# Patient Record
Sex: Male | Born: 1955
Health system: Southern US, Community
[De-identification: ages and names within clinical notes are randomized; demographics above are authoritative.]

## PROBLEM LIST (undated history)

## (undated) DIAGNOSIS — M1990 Unspecified osteoarthritis, unspecified site: Secondary | ICD-10-CM

## (undated) DIAGNOSIS — I2510 Atherosclerotic heart disease of native coronary artery without angina pectoris: Secondary | ICD-10-CM

## (undated) DIAGNOSIS — E785 Hyperlipidemia, unspecified: Secondary | ICD-10-CM

## (undated) DIAGNOSIS — G4730 Sleep apnea, unspecified: Secondary | ICD-10-CM

## (undated) DIAGNOSIS — E119 Type 2 diabetes mellitus without complications: Secondary | ICD-10-CM

## (undated) DIAGNOSIS — I10 Essential (primary) hypertension: Secondary | ICD-10-CM

## (undated) HISTORY — PX: SHOULDER ARTHROSCOPY: SHX128

## (undated) HISTORY — PX: BICEPS TENDON REPAIR: SHX566

## (undated) HISTORY — DX: Sleep apnea, unspecified: G47.30

## (undated) HISTORY — DX: Essential (primary) hypertension: I10

## (undated) HISTORY — PX: KNEE ARTHROSCOPY: SUR90

## (undated) HISTORY — PX: OTHER SURGICAL HISTORY: SHX169

## (undated) HISTORY — PX: TONSILLECTOMY: SUR1361

## (undated) HISTORY — PX: CARDIAC CATHETERIZATION: SHX172

## (undated) HISTORY — DX: Hyperlipidemia, unspecified: E78.5

## (undated) HISTORY — DX: Type 2 diabetes mellitus without complications: E11.9

## (undated) HISTORY — DX: Atherosclerotic heart disease of native coronary artery without angina pectoris: I25.10

---

## 1999-06-21 ENCOUNTER — Inpatient Hospital Stay (HOSPITAL_COMMUNITY): Admission: RE | Admit: 1999-06-21 | Discharge: 1999-06-22 | Payer: Self-pay | Admitting: Otolaryngology

## 2000-12-18 ENCOUNTER — Ambulatory Visit (HOSPITAL_COMMUNITY): Admission: RE | Admit: 2000-12-18 | Discharge: 2000-12-18 | Payer: Self-pay | Admitting: Orthopedic Surgery

## 2000-12-18 ENCOUNTER — Encounter: Payer: Self-pay | Admitting: Orthopedic Surgery

## 2000-12-20 ENCOUNTER — Ambulatory Visit (HOSPITAL_COMMUNITY): Admission: RE | Admit: 2000-12-20 | Discharge: 2000-12-20 | Payer: Self-pay | Admitting: Orthopedic Surgery

## 2001-05-21 ENCOUNTER — Inpatient Hospital Stay (HOSPITAL_COMMUNITY): Admission: AD | Admit: 2001-05-21 | Discharge: 2001-05-23 | Payer: Self-pay | Admitting: *Deleted

## 2001-05-21 ENCOUNTER — Encounter: Payer: Self-pay | Admitting: *Deleted

## 2003-01-12 ENCOUNTER — Ambulatory Visit (HOSPITAL_COMMUNITY): Admission: RE | Admit: 2003-01-12 | Discharge: 2003-01-12 | Payer: Self-pay | Admitting: Gastroenterology

## 2003-01-12 ENCOUNTER — Encounter (INDEPENDENT_AMBULATORY_CARE_PROVIDER_SITE_OTHER): Payer: Self-pay | Admitting: Specialist

## 2003-05-13 ENCOUNTER — Inpatient Hospital Stay (HOSPITAL_COMMUNITY): Admission: EM | Admit: 2003-05-13 | Discharge: 2003-05-18 | Payer: Self-pay | Admitting: Cardiology

## 2004-10-27 ENCOUNTER — Ambulatory Visit: Payer: Self-pay | Admitting: Internal Medicine

## 2004-11-03 ENCOUNTER — Ambulatory Visit: Payer: Self-pay | Admitting: Cardiology

## 2004-11-03 ENCOUNTER — Inpatient Hospital Stay (HOSPITAL_BASED_OUTPATIENT_CLINIC_OR_DEPARTMENT_OTHER): Admission: RE | Admit: 2004-11-03 | Discharge: 2004-11-03 | Payer: Self-pay | Admitting: Cardiology

## 2004-11-15 ENCOUNTER — Ambulatory Visit: Payer: Self-pay | Admitting: Cardiology

## 2005-05-31 ENCOUNTER — Ambulatory Visit (HOSPITAL_COMMUNITY): Admission: RE | Admit: 2005-05-31 | Discharge: 2005-05-31 | Payer: Self-pay | Admitting: Specialist

## 2006-01-30 ENCOUNTER — Encounter: Admission: RE | Admit: 2006-01-30 | Discharge: 2006-01-30 | Payer: Self-pay | Admitting: Specialist

## 2006-04-19 ENCOUNTER — Ambulatory Visit (HOSPITAL_COMMUNITY): Admission: RE | Admit: 2006-04-19 | Discharge: 2006-04-19 | Payer: Self-pay | Admitting: Specialist

## 2007-02-19 ENCOUNTER — Ambulatory Visit: Payer: Self-pay | Admitting: Cardiology

## 2007-02-19 ENCOUNTER — Observation Stay (HOSPITAL_COMMUNITY): Admission: AD | Admit: 2007-02-19 | Discharge: 2007-02-20 | Payer: Self-pay | Admitting: Internal Medicine

## 2008-10-18 ENCOUNTER — Ambulatory Visit (HOSPITAL_COMMUNITY): Admission: RE | Admit: 2008-10-18 | Discharge: 2008-10-18 | Payer: Self-pay | Admitting: Specialist

## 2009-02-14 ENCOUNTER — Encounter: Admission: RE | Admit: 2009-02-14 | Discharge: 2009-02-14 | Payer: Self-pay | Admitting: Specialist

## 2009-02-25 ENCOUNTER — Ambulatory Visit (HOSPITAL_COMMUNITY): Admission: RE | Admit: 2009-02-25 | Discharge: 2009-02-25 | Payer: Self-pay | Admitting: Specialist

## 2010-06-12 LAB — CBC
MCHC: 34.2 g/dL (ref 30.0–36.0)
Platelets: 227 10*3/uL (ref 150–400)
RDW: 13.2 % (ref 11.5–15.5)
WBC: 5.8 10*3/uL (ref 4.0–10.5)

## 2010-06-12 LAB — BASIC METABOLIC PANEL
BUN: 11 mg/dL (ref 6–23)
CO2: 27 mEq/L (ref 19–32)
Chloride: 99 mEq/L (ref 96–112)
GFR calc Af Amer: >60 mL/min (ref >60)
GFR calc non Af Amer: >60 mL/min (ref >60)
Potassium: 4.5 mEq/L (ref 3.5–5.1)

## 2010-06-12 LAB — GLUCOSE, CAPILLARY
Glucose-Capillary: 157 mg/dL — ABNORMAL HIGH (ref 70–99)
Glucose-Capillary: 158 mg/dL — ABNORMAL HIGH (ref 70–99)
Glucose-Capillary: 175 mg/dL — ABNORMAL HIGH (ref 70–99)

## 2010-06-17 LAB — GLUCOSE, CAPILLARY
Glucose-Capillary: 115 mg/dL — ABNORMAL HIGH (ref 70–99)
Glucose-Capillary: 120 mg/dL — ABNORMAL HIGH (ref 70–99)
Glucose-Capillary: 141 mg/dL — ABNORMAL HIGH (ref 70–99)

## 2010-06-17 LAB — COMPREHENSIVE METABOLIC PANEL
Alkaline Phosphatase: 62 U/L (ref 39–117)
Calcium: 9.2 mg/dL (ref 8.4–10.5)
Creatinine, Ser: 0.88 mg/dL (ref 0.4–1.5)

## 2010-06-17 LAB — HEMOGLOBIN AND HEMATOCRIT, BLOOD: HCT: 45.7 % (ref 39.0–52.0)

## 2010-07-25 NOTE — Op Note (Signed)
[name], [name]                 ACCOUNT NO.:  [id number]   MEDICAL RECORD NO.:  [id number]          PATIENT TYPE:  AMB   LOCATION:  DAY                          FACILITY:  [facility]   PHYSICIAN:  Jene Every, M.D.    DATE OF BIRTH:  [birth year]   DATE OF PROCEDURE:  10/18/2008  DATE OF DISCHARGE:                               OPERATIVE REPORT   PREOPERATIVE DIAGNOSIS:  Medial meniscal tear, left knee.   POSTOPERATIVE DIAGNOSES:  Medial meniscal tear, left knee, grade 3  chondromalacia patella and medial femoral condyle, lateral tibial  plateau.   PROCEDURE PERFORMED:  Left knee arthroscopy, partial medial  meniscectomy, chondroplasty patella, medial femoral condyle, lateral  tibial plateau.   ANESTHESIA:  General   ASSISTANT:  None.   BRIEF HISTORY AND INDICATIONS:  This is a 55 year old with refractory  knee pain, swelling and giving way.  This has been refractory to  conservative treatment, was indicated for arthroscopic debridement and  partial medial meniscectomy for suspected meniscus tear.  Risks and  benefits discussed including, bleeding, infection, no change in  symptoms, worsening symptoms, DVT, PE anesthetic complications, etc.   TECHNIQUE:  The patient in supine position, after adequate level of  anesthesia and 2 grams Kefzol, left lower extremity was prepped and  draped in the usual sterile fashion.  A lateral parapatellar portal and  superomedial parapatellar portal was fashioned with a #11 blade.  Ingress cannula atraumatically placed.  Irrigant was utilized to  insufflate the joint.  Under direct visualization, medial parapatellar  portal was fashioned with a #11 blade after localization with 18 gauge  needle sparing the medial meniscus.  Noted was a posterior third, inner  third medial meniscus tear, radial tears, multiple.  Straight basket  rongeur was introduced and utilized to perform a partial medial  meniscectomy to a stable base, further contoured with  35 Cuda shaver.  There were chondral flap tears off the femoral condyle and a light  shaving chondroplasty was performed.  Mild degenerative changes of the  ACL.  This was debrided.  The ACL was not visualized.  He had no  instability on exam.   Lateral compartment reveals a small grade 3 lesion of the anterior  portion of lateral tibial plateau.  This was lightly shaved to a stable  base with a 35 Cuda shaver.   Next in the patellofemoral joint there was normal patellofemoral  tracking.  There was extensive grade 3 changes of patella, chondroplasty  was lightly performed here.  A partial synovectomy performed as well.  The knee was copiously lavaged.  Reexamined medial compartment, both  menisci stable to probe palpation.  No further pathology amenable to  arthroscopic intervention.  Knee was copiously lavaged, all  instrumentation was removed.  Portals  closed with 4-0 nylon simple sutures.  0.25% Marcaine with epinephrine  was infiltrated in the joint.  The wound was dressed sterilely, woken  without difficulty, transported to the recovery room in satisfactory  condition.   The patient tolerated the procedure well.  No complications.      Jene Every, M.D.  Electronically Signed     JB/MEDQ  D:  10/18/2008  T:  10/18/2008  Job:  [id number]

## 2010-07-25 NOTE — H&P (Signed)
[name], [name]                 ACCOUNT NO.:  [id number]   MEDICAL RECORD NO.:  [id number]          PATIENT TYPE:  INP   LOCATION:  3729                         FACILITY:  [facility]   PHYSICIAN:  Gerrit Friends. Dietrich Pates, MD, FACCDATE OF BIRTH:  [birth year]   DATE OF ADMISSION:  02/19/2007  DATE OF DISCHARGE:                              HISTORY & PHYSICAL   SUMMARY OF HISTORY:  [name] is a 55 year old white male who was  transferred from [facility] emergency room via [facility] to [facility] for further evaluation of chest discomfort.  Mr. Mcphearson  describes a 2-day history of sharp left anterior chest pain.  He  describes it as a quick sensation lasting 3-4 seconds, giving it a 5-6  on a scale of 0/10.  He notices a flushed feeling when he experiences  the discomfort, but he specifically denies any shortness of breath,  nausea, vomiting, diaphoresis.  He has also noted some consistent left  arm and hand numbness in the past couple days but not specifically  associated with a sharp discomfort.  He initially attributed this to his  left biceps surgery that he had many years ago, but this is a new  sensation for him.  He has had four episodes of this discomfort, three  last night while working.  After the third episode last night, he  presented to the emergency room for evaluation.  He denies prior  occurrences and states that this discomfort is different from his prior  intervention which he describes as anterior chest pressure sensation.  He goes on to say that he has had several interventions, and prior to  each intervention, he has had a negative stress test despite  catheterization showing a significant blockage.   PAST MEDICAL HISTORY:  NO KNOWN DRUG ALLERGIES.   MEDICATIONS:  Prior to admission, the patient is unclear prescriptions  and doses.  He thinks he is on generic Altace, Lipitor, Lantus,  Glucophage and folic acid.  He is not clear about the rest.  He does  take  aspirin 325 every day, and he does not know where his nitroglycerin  is.   He has a history of obstructive sleep apnea.  He does use CPAP.  He has  had uvula and tonsil and adenoid surgery in April 2001.  He has a  history of hypertension for the preceding 5 years, although he does not  check his blood pressures.  He also has type 2 diabetes for the  preceding 6 years, although he does not check his sugars.  History of  hyperlipidemia, unknown last check.  He does have a history of coronary  artery disease with an Express stent to the LAD in March 2003.  He had a  cutting balloon angioplasty of the LAD at the descending bifurcation by  Dr. Chales Abrahams in March 2005.  Each test was preceded by a stress test that  according to the patient was negative.  Last catheterization was on  November 03, 2004 that showed EF of 55%, anterior hypokinesis,  nonobstructive coronary disease, continue medical treatment.  Surgical history in addition to the above is also notable for left  rotator cuff repair in 2008, right knee surgery March 2007, left biceps  surgery and LASIK surgery.   SOCIAL HISTORY:  He lives in [place] with his wife.  He is employed with  [employer].  He has never smoked.  He may drink 1 time every  2-3 weeks, including beer.  Last intake was this past Sunday at the  [employer] game in which he had five 16-ounce beers.  He denies any drugs  or herbal medications.  Does not follow a diet and does not exercise.   FAMILY HISTORY:  His mother died in her 13s with sepsis.  Father at the  age of 68 with myocardial infarction.  He has one sister who is alive  and well and who is in remission from some type of cancer.   REVIEW OF SYSTEMS:  In addition to the above is notable for reading  glasses.  He denies any arthralgias, given his history of arthritis.  All other systems unremarkable.   PHYSICAL EXAMINATION:  GENERAL:  Well-nourished, well-developed,  pleasant, obese white male in  no apparent distress.  Blood pressure is  115/76, pulse 88 and regular, respirations 18, glucose 102, 98%  saturation on room air.  HEENT:  Is unremarkable.  NECK:  Supple without thyromegaly, adenopathy, JVD or carotid bruits.  CHEST:  Symmetrical excursion.  Clear to auscultation without rales,  rhonchi or wheezing.  HEART:  PMI is not displaced.  Regular rate and rhythm.  Do not  appreciate any murmurs, rubs, clicks or gallops.  All pulses are  symmetrical and intact.  I did not appreciate any abdominal or femoral  bruits.  SKIN:  Integument is intact without rashes or lesions.  ABDOMEN:  Obese.  Bowel sounds present without organomegaly, masses or  tenderness.  EXTREMITIES:  Negative cyanosis, clubbing or edema.  MUSCULOSKELETAL/NEUROLOGICAL:  Unremarkable.   Chest x-ray done at [facility]; results are pending.  EKG shows normal  sinus rhythm, left axis deviation, early R-wave, nonspecific ST-T wave  changes with a ventricular rate of 85.  H&H is 14.4 and 40.8, normal  indices, platelets 212, WBCs 6.4.  Sodium 136, potassium 3.4, BUN 13,  creatinine 0.8, glucose 167.  PTT 27.3, PT 14.1 with INR 1.1.  CK total  was 177 and MB 6.6 and troponin 0.02.  At [facility], he received  IV heparin 5000 unit bolus as well as a drip, nitroglycerin paste 1-  inch, IV Lopressor 5 mg x2.   IMPRESSION:  1. Atypical chest discomfort with normal EKG and troponin but slightly      abnormal CK-MB.  2. Hypokalemia.  3. Hyperglycemia.  4. History as noted per past medical history.   DISPOSITION:  Dr. Dietrich Pates reviewed the patient's history, spoke with  and examined the patient, and agrees with the above.  Given his negative  stress test prior to positive catheterizations and interventions, the  patient is reluctant to proceed with stress test to evaluate his chest  discomfort.  Under these circumstances, cardiac catheterization is  appropriate.  I have asked his wife to bring in his home  medications,  but until then, we will continue IV heparin, aspirin, Lipitor, Protonix  and potassium supplementation as well as a beta blocker.  We will hold  Glucophage.  I have also ordered diabetes and dietary teaching.      Joellyn Rued, PA-C      Gerrit Friends. Dietrich Pates, MD,  [facility]  Electronically Signed    EW/MEDQ  D:  02/19/2007  T:  02/19/2007  Job:  [id number]   cc:   Learta Codding, MD,FACC  Wyvonnia Lora

## 2010-07-25 NOTE — Cardiovascular Report (Signed)
[name], GARCIAGARCIA                 ACCOUNT NO.:  [id number]   MEDICAL RECORD NO.:  [id number]          PATIENT TYPE:  INP   LOCATION:  3729                         FACILITY:  [facility]   PHYSICIAN:  Arturo Morton. Riley Kill, MD, FACCDATE OF BIRTH:  11-Mar-1956   DATE OF PROCEDURE:  DATE OF DISCHARGE:                            CARDIAC CATHETERIZATION   INDICATIONS:  Mr. Javid is a 55 year old diabetic who has previously  undergone stenting of the left anterior descending artery.  In 2005, I  did a repeat intervention involving cutting balloon angioplasty of the  LAD stent.  He now presents with some recurrent chest pain, although he  says that this is slightly different than what he has had in the past.  Importantly, in the past it was somewhat atypical, but with inflation of  the balloon, it exactly reproduced the symptoms.  The patient remembers  this as to I.   He was brought to the lab for further evaluation.   PROCEDURE:  1. Left heart catheterization.  2. Selective coronary arteriography.  3. Selective left ventriculography.   DESCRIPTION OF PROCEDURE:  The patient was brought to the cath lab and  prepped and draped in the usual fashion.  Through an anterior puncture,  the right femoral artery was easily entered.  A 5-French sheath was  placed.  Views of the left and right coronary arteries were obtained in  multiple angiographic projections.  Central aortic and left ventricular  pressures were measured with pigtail.  Ventriculography was then  performed in the RAO projection.  I then carefully studied the films and  went back and took additional views of the LAD to make sure that the LAD  stent diagonal area as well laid out.  Following this, all catheters  were subsequently removed.  He is taken to the holding area for sheath  removal.  There were no complications in the laboratory.   HEMODYNAMIC DATA:  1. Central aortic pressure 102/75, mean 88.  2. Left ventricular pressure  107/13.  3. No gradient or pullback across the aortic valve.   ANGIOGRAPHIC DATA:  1. The left main coronary artery short and free of critical disease.  2. The LAD has minimal luminal irregularity proximally.  Following      this, there is probably about 20% narrowing in the midportion of      the proximal LAD.  Just distal to this, there is a trifurcation of      the septal perforator, enlarged diagonal, and an LAD.  Just prior      to that is the previous site of stenting.  The stent site appears      to be really quite widely patent.  The LAD just beyond this large      diagonal septal takeoff has about 40-50% narrowing, but appears to      be a relatively stable lumen.  The distal LAD then bifurcates      distally.  The large diagonal branch has some luminal irregularity      in its midportion but does not appear to be critically narrow.  3. The circumflex has a small first marginal and a large second      marginal, both of which appear to be free of critical disease  4. The right coronary artery is a large-caliber tortuous vessel.  The      right coronary artery has about 30% mid-narrowing.  There is about      40% posterolateral stenosis and a 20% mid PDA stenosis.  There does      not appear to be high-grade narrowing in this vessel.   CONCLUSION:  1. Preserved left ventricular function with ejection fraction in      excess of 60%.  2. Continued patency of the stent site at the site of subsequent      cutting balloon angioplasty.  3. Scattered coronary irregularities as noted above.  4. Probable poor control of diabetes mellitus.   RECOMMENDATIONS:  We will likely recommend medical therapy.  We will  keep him overnight and recheck his enzymes.      Arturo Morton. Riley Kill, MD, [facility]  Electronically Signed     TDS/MEDQ  D:  02/19/2007  T:  02/20/2007  Job:  [id number]   cc:   Ellyn Hack, MD,FACC  [facility]

## 2010-07-25 NOTE — Discharge Summary (Signed)
[name], [name]                 ACCOUNT NO.:  [id number]   MEDICAL RECORD NO.:  [id number]          PATIENT TYPE:  INP   LOCATION:  3729                         FACILITY:  [facility]   PHYSICIAN:  Gerrit Friends. Dietrich Pates, MD, FACCDATE OF BIRTH:  04-Nov-1955   DATE OF ADMISSION:  02/19/2007  DATE OF DISCHARGE:                               DISCHARGE SUMMARY   PROCEDURES:  1. Cardiac catheterization.  2. Coronary arteriogram.  3. Left ventriculogram.   PRIMARY FINAL DISCHARGE DIAGNOSIS:  Chest pain, medical therapy for  coronary artery disease.   SECONDARY DIAGNOSES:  1. Obstructive sleep apnea, on continuous positive airway pressure.  2. Hypertension.  3. Diabetes.  4. Hyperlipidemia.  5. Status post Express stent to the left anterior descending artery in      March 2003.  6. Cutting balloon angioplasty to the left anterior descending artery      at the descending bifurcation in March 2005.  7. Status post tonsillectomy, adenoidectomy and uvulectomy August      2001.  8. Status post left rotator cuff repair, right knee surgery, left      biceps surgery and LASIK surgery.  9. Family history of coronary artery disease in his father.  10.Hypokalemia.   TIME AT DISCHARGE:  33 minutes.   HOSPITAL COURSE:  [name] is a 55 year old male with known coronary  artery disease.  He had sharp anterior chest pain that was associated  with some left arm and hand numbness.  He came to [facility],  where he was evaluated and transferred to [facility].   His cardiac enzymes were negative for MI.  To further assess him, a  cardiac catheterization was performed on February 19, 2007.  The cardiac  catheterization showed multivessel disease with lesions between 20 and  50% in the LAD, RCA, PDA, PLA, and in a diagonal branch.  There was 40%  in-stent restenosis.  His EF was normal.  Dr. Riley Kill evaluated the  films and recommended medical therapy.   [name] was held overnight and his labs  were reviewed.  His cardiac  enzymes remained negative and his blood pressure was stable.  His blood  sugars ranged between 109 and 176.  He is encouraged to follow heart-  healthy diet and continue his Lantus.  His hemoglobin A1c was 8.5.  Total cholesterol was 178 with triglycerides 269, HDL 30, LDL 94.  Of  note, his initial CK-MB was slightly elevated at 18/4.5 with an index of  3.3.  However, other CK-MBs and all troponins were negative.   On February 20, 2007, his vital signs were stable and his cath site was  benign.  He was evaluated by Dr. Dietrich Pates, who considered him stable for  discharge with outpatient follow-up arranged.   DISCHARGE INSTRUCTIONS:  1. His activity level is to be increased gradually with no lifting for      a week and no driving for 2 days.  2. He is to call our office for any problems with the catheterization      site.  3. He is to stick to  a diabetic diet.  4. He is to follow up with Dr. Andee Lineman on January 2 at 3 p.m. and with      Dr. Margo Common as needed.   DISCHARGE MEDICATIONS:  1. Metformin 850 mg b.i.d., restart February 22, 2007.  2. Ramipril 5 mg daily.  3. Lipitor 40 mg a day.  4. Aspirin 325 mg daily.  5. Fish oil 2400 mg daily.  6. Lantus 72 units daily.  7. Folate Plus daily.  8. Metoprolol 25 mg b.i.d.  9. Nitroglycerin sublingual p.r.n.  10.Tramadol 50 mg and diclofenac 75 mg as prior to admission.      Theodore Demark, PA-C      Gerrit Friends. Dietrich Pates, MD, [facility]  Electronically Signed    RB/MEDQ  D:  02/20/2007  T:  02/20/2007  Job:  [id number]   cc:   [facility]  Wyvonnia Lora

## 2010-07-28 NOTE — Op Note (Signed)
[name]  Patient:    [name], [name]                        MRN: [id number] Proc. Date: 06/21/99 Adm. Date:  [id number] Attending:  Barbee Cough                           Operative Report  PREOPERATIVE DIAGNOSES: 1. Severe obstructive sleep apnea. 2. Hypertrophy of the tonsils, palate and tongue base. 3. Non-insulin-dependent diabetes.  POSTOPERATIVE DIAGNOSES: 1. Severe obstructive sleep apnea. 2. Hypertrophy of the tonsils, palate and tongue base. 3. Non-insulin-dependent diabetes.  INDICATIONS: 1. Severe obstructive sleep apnea. 2. Hypertrophy of the tonsils, palate and tongue base. 3. Non-insulin-dependent diabetes.  SURGICAL PROCEDURE: 1. Uvulopalatopharyngoplasty. 2. Somnoplasty (volumetric reduction) of the tongue base. 3. Tonsillectomy.  SURGEON:  Kinnie Scales. Annalee Genta, M.D.  ANESTHESIA:  General endotracheal.  ESTIMATED BLOOD LOSS:  approximately 50 cc.  COMPLICATIONS:   None.  Patient was transferred from the operating room to the recovery room in stable condition.  BRIEF HISTORY:  [name] is a 55 year old white male, who presented for evaluation and surgical management of obstructive sleep apnea.  The patient had undergone multiple previous treatments and evaluations and had been wearing home CPAP for  approximately 10 years with relatively good results.  The patient was frustrated with his chronic CPAP usage and desired surgical intervention in order to improve his airway.  A preoperative sleep study was obtained in February 2001 and this showed an RDI of 72.3 events per hour.  Patient responded well to CPAP with complete resolution of his ongoing apnea.  Given his history and examination, including diagnostic nasal laryngoscopy, the patient was found to have obstruction related to the palate, tonsils and tongue base.  Patient had undergone previous  laser palatoplasty with limited improvement in his day to  day symptoms.  Given is history and examination, he was scheduled for the above surgical procedures. Prior to surgery, informed consent was obtained from his patient and his wife.  The risks, benefits, and possible complications of each of these procedures were discussed in detail and they understood and concurred with our plan for surgery. SURGICAL PROCEDURE:  Patient was brought to the operating room on June 21, 1999 at [facility] main OR.  General endotracheal anesthesia was established without  difficulty.  When the patient had been adequately anesthetized, the oral cavity and oropharynx were examined.  He was found to have hypertrophy of the palate, tonsils and tongue base.  The surgical procedure was begun with a uvulopalatopharyngoplasty and tonsillectomy.  A Crowe-Davis mouthgag was inserted without difficulty. Patients oral cavity and oropharynx were irrigated and suctioned and tonsillectomy was performed using the harmonic scalpel and beginning on the left hand side. Dissecting in a subcapsular fashion, the entire left tonsil was removed. Dissection carried from the superior tonsillar pole to tongue base.  The right tonsil was removed in a similar fashion and tissue was sent to pathology for gross and microscopic evaluation.  The uvulopalatopharyngoplasty was then undertaken.  The palate was gently palpated posteriorly and the patient was found to have palatal hypertrophy, approximately 1-1.5 cm of palatal tissue, including the remnant of the uvula and the free margin of the palate was resected using the Harmonic scalpel.  Dissection was carried from the superior aspect of the tonsillar fossae and the incision was carried from anterior to posterior through the  palatal tissue leaving palatal mucosa posteriorly.  Posterior tonsillar pillars were left intact with removal of the abnormal tissue.  Suspensory sutures were then placed consisting of 3-0 Vicryl suture  placed in a horizontal mattressing fashion. The posterior tonsillar pillars were advanced anteriorly and sutured bilaterally. Interrupted sutures then placed along the palatal mucosal margin and the posterior mucosa was advanced anteriorly.  Multiple sutures were placed and the palatal fossa was closed bilaterally.  Prior to closure, the Crowe-Davis mouthgag was released and reapplied and gentle abrasion with a dry tonsillar sponge was performed. There was no active bleeding along the tonsillar fossa or tongue base.  With the tonsillectomy and uvulopalatopharyngoplasty completed, the tongue base somnoplasty was performed.  The patient was injected with a combination of 0.5% Marcaine with 1:200,000 solution epinephrine, which had been diluted 50/50 in sterile injectable normal saline.  Approximately 4 cc of injection was then placed in the tongue base at each of the somnoplasty sites.  A total of 4 somnoplasty lesions were created in the region of the circumvallate papillae.  Using the somnoplasty device with a tongue base probe set at a 10 watt maximum, the energy was delivered in individual lesions for a total of four lesions at 750 joules per lesion.  There was no bleeding and no significant swelling.  The patients oral cavity and oropharynx were then thoroughly irrigated.  There was no bleeding from the oropharynx and ral gastric tube was passed and the stomach contents were aspirated.  Patient was then awakened from his anesthetic.  He was extubated without difficulty and was transferred from the operating room to the recovery room in stable condition. DD:  06/21/99 TD:  06/21/99 Job: [id number]

## 2010-07-28 NOTE — Op Note (Signed)
NAMEGAVIN, [name]                 ACCOUNT NO.:  [id number]   MEDICAL RECORD NO.:  [id number]          PATIENT TYPE:  AMB   LOCATION:  DAY                          FACILITY:  [facility]   PHYSICIAN:  Jene Every, M.D.    DATE OF BIRTH:  [birth year]   DATE OF PROCEDURE:  04/19/2006  DATE OF DISCHARGE:                               OPERATIVE REPORT   PREOPERATIVE DIAGNOSIS:  Rotator cuff arthropathy, acromioclavicular  arthrosis.   POSTOPERATIVE DIAGNOSIS:  Rotator cuff arthropathy, acromioclavicular  arthrosis.   PROCEDURE PERFORMED:  Left shoulder arthroscopy, subacromial  decompression, bursectomy, followed by open distal clavicle resection.   ANESTHESIA:  General.   ASSISTANT:  Roma Schanz, P.A.   BRIEF HISTORY/INDICATIONS:  A 55 year old with shoulder pain, positive  impingement sign.  Positive secondary impingement sign.  Tender over the  [facility].  Positive crossover maneuver.  [facility] demonstrated significant AC  arthrosis, rotator cuff arthropathy.  Operative intervention was  indicated for arthroscopic debridement and subacromial decompression,  possible open distal clavicle resection.  Risks and benefits discussed,  including bleeding, infection, damage to vascular structures, no changes  in symptoms, worsening symptoms, need for repeat debridement procedure  in the future.   TECHNIQUE:  Patient in the supine beach chair position after the  induction of adequate general anesthesia and 1 gm of Kefzol, the left  shoulder and upper extremity was prepped and draped in the usual sterile  fashion.  A surgical marker was utilized to delineate the acromion and  the [facility] joint.  A standard posterolateral incision was made through the  skin only and the anterolateral incision as well.  I introduced the  arthroscopic cannula into the glenohumeral space with the arm in the  70/30 position towards the coracoid, penetrating it atraumatically.  Irrigant was utilized to insufflate the  joint.  Inspection revealed a  normal glenohumeral joint.  There was normal biceps tendon and mild  degenerative fraying of the labrum.  Mild degenerative fraying of the  inferior aspect of the rotator cuff with internal and external rotation.  No full-thickness tear was appreciated.  After copious lavage, we  redirected the camera into the subacromial space.  There was  hypertrophic synovitis noted.  Introduced the camera laterally.  The  shaver was introduced to utilize and perform a bursectomy.  Full  inspection of the rotator cuff revealed no evidence of a rotator cuff  tear.  I used the ArthroWand to release  the CA ligament and shave the  under-surface of the acromion.  I used the ArthroWand to remove the  under-surface of the distal clavicle, the capsule.  Could not deliver it  into the subacromial space.  This was felt to be fairly hypertrophic  with inferior projecting osteophytic spurs.  Next, I tried to convert to  an open distal clavicle resection and removed the arthroscopic camera  and closed the portals with 4-0 nylon simple sutures.  I made an  incision over the [facility] joint, and subcutaneous tissue was dissected with  electrocautery utilized to achieve hemostasis.  The deltotrapezia fascia  was divided, as was  the capsule.  We skeletonized the distal clavicle  with an AL elevator to protect it anteriorly and posteriorly with a  Rogelia Rohrer and a Ship broker inferiorly.  I used an oscillating saw to  remove the distal centimeter of the clavicle.  This was hypertrophic.  There was synovitis noted.  I then undercut the remaining portion of the  distal clavicle with a 3 mm Kerrison, removing all inferiorly projecting  spurs.  This was then felt to be satisfactory.  It was still stable  after the decompression.  We copiously irrigated it and placed bone wax  on the distal clavicle.  I then repaired the capsule with #1 Vicryl  interrupted figure-of-eight sutures.  The subcutaneous  tissue was  reapproximated with 2-0 Vicryl simple sutures.  The skin was  reapproximated with 4-0 subcuticular Prolene.  The wound was reinforced  with Steri-Strips.  Sterile dressing applied.  He was placed supine on  the hospital bed, extubated without difficulty.  Placed in a sling and  transported to the recovery room in satisfactory condition.  We closed  the arthroscopic portals as well with 4-0 nylon.  Assistant was  [employer].      Jene Every, M.D.  Electronically Signed     JB/MEDQ  D:  04/19/2006  T:  04/19/2006  Job:  [id number]

## 2010-07-28 NOTE — Cardiovascular Report (Signed)
NAMEOATHER, MUILENBURG                 ACCOUNT NO.:  [id number]   MEDICAL RECORD NO.:  [id number]          PATIENT TYPE:  OIB   LOCATION:  6501                         FACILITY:  [facility]   PHYSICIAN:  Rollene Rotunda, M.D.   DATE OF BIRTH:  [birth year]   DATE OF PROCEDURE:  11/03/2004  DATE OF DISCHARGE:                              CARDIAC CATHETERIZATION   PRIMARY CARE PHYSICIAN:  Dr. Wyvonnia Lora   PROCEDURE:  Left heart catheterization/coronary arteriography.   INDICATION:  To evaluate the patient with chest pain and previous stenting  to his LAD and also cutting balloon of restenosis.   PROCEDURE NOTE:  Left heart catheterization is performed via the right  femoral artery. The artery was cannulated using the anterior wall puncture.  A #4-French arterial sheath was inserted via the modified Seldinger  technique. A preformed Judkins' and a pigtail catheter were utilized. The  patient tolerated the procedure well and left the laboratory in stable  condition.   RESULTS:   HEMODYNAMICS:  LV 109/13, AO 110/90. Coronaries--the left main was normal.  The LAD had a proximal stent with diffuse in-stent luminal irregularities.  There was a mid 25% stenosis after the stent. The first diagonal was a large  vessel and normal. The circumflex and the AV groove were normal. There was a  large mid obtuse marginal which was normal. The right coronary artery was a  large dominant vessel. There were diffuse luminal irregularities. The PDA  was large and normal.   LEFT VENTRICULOGRAM:  The left ventriculogram was obtained in the RAO  projection. The EF was approximately 55% with mild anterior hypokinesis.   CONCLUSION:  Residual nonobstructive coronary artery disease with a patent  LAD stent. Mild left ventricular dysfunction.   PLAN:  The patient will continue to have aggressive secondary risk  reduction. No further cardiovascular evaluation is warranted at this point.     ______________________________  Rollene Rotunda, M.D.     JH/MEDQ  D:  11/03/2004  T:  11/04/2004  Job:  [id number]   cc:   Wyvonnia Lora  [address]  [place]  [place] [address]  Fax: [phone]   Arvilla Meres, M.D. [facility]  [employer]  [address]  [place]  [place] [address]

## 2010-07-28 NOTE — Discharge Summary (Signed)
[place]. [facility]  Patient:    [name], [name]                        MRN: [id number] Adm. Date:  [id number] Disc. Date: [id number] Attending:  Barbee Cough                           Discharge Summary  ADMISSION DIAGNOSES: 1. Severe obstructive sleep apnea. 2. Tonsil, palatal, and tongue base hypertrophy. 3. Hypertension. 4. Non-insulin-dependent diabetes.  DISCHARGE DIAGNOSES: 1. Severe obstructive sleep apnea. 2. Tonsil, palatal, and tongue base hypertrophy. 3. Hypertension. 4. Non-insulin-dependent diabetes.  DISPOSITION:  The patient is discharged home.  CONDITION ON DISCHARGE:  Stable.  DISCHARGE MEDICATIONS: 1. Altace 5 mg p.o. q.d. 2. Glucophage 850 mg p.o. b.i.d. 3. In addition, he was prescribed Lortab elixir, 3-4 teaspoons every four to    six hours as needed for pain. 4. Augmentin elixir 400 mg/5 cc, 2 teaspoon twice daily.  ACTIVITY:  Limited.  No lifting or straining.  DIET:  Soft and liquids, as tolerated.  SPECIAL INSTRUCTIONS:  Bedside humidification and home CPAP humidification.  FOLLOW-UP:  The patient will follow up in my office on Jul 11, 1999, for further postoperative evaluation and management.  SURGICAL PROCEDURE: 1. Tongue base somnoplasty. 2. Uvulopalatopharyngoplasty. 3. Tonsillectomy.  Performed June 21, 1999, under general anesthesia.  HISTORY OF PRESENT ILLNESS:  [name] is a 55 year old white male with a history of severe obstructive sleep apnea.  He has undergone multiple previous airway procedures for management of sleep apnea, and currently uses CPAP on a nightly basis in order to control his severe obstructive sleep apnea.  On a recent sleep study performed in February 2001, he was found to have an RDI of approximately 75 events per hour, which is well controlled with CPAP.  The patients past medical history also includes hypertension, which is controlled with Altace, and non-insulin-dependent  diabetes.  The patient had undergone previous procedures including sinonasal surgery and palatal laser surgery with limited improvement.  Given the patients history and examination, including flexible diagnostic nasal laryngoscopy, I had recommended additional surgical intervention consisting of tonsillectomy, uvulopalatopharyngoplasty, and tongue base somnoplasty.  The risks, benefits, and possible complications of each of these surgical procedures, and the risk of worsening the patients underlying apnea in a brief postoperative period, were discussed.  [name] and his wife understood and concurred with our plan for surgery, and he was admitted to the hospital for the above procedures.  HOSPITAL COURSE:  The patient was admitted on June 21, 1999, to [facility], and taken to the operating room where he underwent tongue base somnoplasty, uvulopalatopharyngoplasty, and tonsillectomy.  The surgical procedure was uneventful, and the patient was transferred from the operating room to the recovery room in stable condition.  After adequate recovery, he was then transferred to Unit 2100 for postoperative care.  The patient was monitored in the postoperative period with continuous pulse oximetry and cardiac monitoring, observing the patient for worsening obstructive sleep apnea.  The patients blood oxygen level was stable at greater than 93% throughout the entire postoperative period on room air.  He wore his home CPAP while asleep, and again his pulse oximetry, blood pressure, and cardiac rhythm were normal.  The patient complained of moderate sore throat, which was well controlled with his oral narcotic pain medications.  His blood sugars were followed closely, and CBG levels  were below 250 throughout the postoperative period.  The patient was controlled with his oral hypoglycemic agent as well as additional sliding scale insulin.  [name] is discharged to home in stable condition,  tolerating a soft oral diet and liquids, having normal bowel and bladder function, and ambulating without difficulty.  He is discharged with the above discharge instructions, and will follow up in my office on Jul 11, 1999, for further evaluation and management of the above problems. DD: 06/22/99 TD:  06/22/99 Job: [id number]

## 2010-07-28 NOTE — Cardiovascular Report (Signed)
[place]. [facility]  Patient:    [name], [name] Visit Number: [id number] MRN: [id number]          Service Type: MED Location: [id number] Attending Physician:  Glennon Hamilton Dictated by:   Veneda Melter, M.D. Proc. Date: 05/22/01 Admit Date:  05/21/2001 Discharge Date: 05/23/2001   CC:         Jonelle Sidle, M.D. [facility]  Wyvonnia Lora, M.D.   Cardiac Catheterization  PROCEDURES PERFORMED: 1. Left heart catheterization. 2. Left ventriculogram. 3. Selective coronary angiography. 4. Percutaneous transluminal coronary angioplasty and stent placement,    proximal left anterior descending.  DIAGNOSES: 1. Two-vessel coronary artery disease. 2. Normal left ventricular systolic function. 3. Unstable angina.  INDICATIONS: The patient is a 55 year old white male, who presents with several days of substernal chest discomfort. The patient presented to the hospital and ruled out for acute myocardial infarction. Due to his risks factors and the severity of his pain, he presents now for further cardiac assessment.  TECHNIQUE: After informed consent was obtained, the patient was brought to the cardiac catheterization lab where a 6 French sheath was placed in the right femoral artery using the modified Seldinger technique.  A 6 [place] and JR4 catheters then used to engage the left and right coronary arteries and selective angiography performed using manual injections of contrast. A 6 French pigtail catheter was then advanced to the left ventricle and a left ventriculogram performed using power injections of contrast.  FINDINGS: Initial findings are as follows: 1. Left main trunk: The left main trunk is a medium caliber vessel with mild    irregularities. 2. LAD: This is a large caliber vessel that provides a major first diagonal    branch in its proximal segment as well as a septal perforator. The    LAD then extends to the apex.  There is a eccentric high-grade narrowing    of 90% in the proximal LAD and what appears to be an ulcerated lesion.    This extends up to the diagonal branch. There is then moderate disease    encompassing this bifurcation extending into the mid LAD. The distal    LAD has mild irregularities as does the large first diagonal branch. 3. Left circumflex artery: A medium caliber vessel that consists of a large    diagonal branch in the mid section. The left circumflex system has mild    irregularities. 4. Right coronary artery is dominant: This is a large caliber vessel that    provides a posterior descending artery and a posterior ventricular branch    in its terminal segment. The right coronary artery has moderate narrowing    of 50% in the mid section.  LEFT VENTRICULOGRAM: Normal end-systolic and end-diastolic dimensions. Overall left ventricular function is well preserved, ejection fraction of approximately 55%. No mitral regurgitation noted. There is however, hypokinesis of the mid anterolateral wall. Left ventricular pressure is 115/10, aortic is 115/73. LVEDP equals 20.  These findings were reviewed with the patient including treatment options and he elected to proceed with percutaneous intervention. The 6 French sheath was exchanged for a 7 [place] sheath. The patient was given heparin and ReoPro on a weight-adjusted basis to maintain an ACT of greater than 225 seconds.  He was also given 300 mg of Plavix at the end of the case. A 7 French Q 3.5 guide catheter was used to engage the left coronary artery and a 0.014 inch extra-support  wire advanced in the distal LAD. A second extra-support wire was then positioned in the diagonal branch for protection. A 3.0 x 15 mm Maverick balloon was introduced and used to pre-dilate the lesion. Two inflations were performed at 8 atmospheres for 30 seconds. Repeat angiography showed improvement in vessel lumen with significant residual disease. A  3.5 x 15 mm Cutting Balloon was introduced. Two inflations were performed at 6 atmospheres for 30 seconds and a single inflation of 10 atmospheres for 30 seconds in the proximal and mid LAD across the bifurcation. Repeat angiography showed improvement in vessel lumen, particularly immediately after the diagonal branch. There was residual disease of 30-40% prior to the bifurcation as this was the area of severe stenosis and perhaps and ulcerated plaque. It was felt that stent placement would be beneficial. A 4.0 x 12 mm Express II stent was introduced and carefully positioned in the proximal LAD extending up to the bifurcation where the diagonal branch was deployed at 12 atmospheres for 45 seconds. Repeat angiography was performed after the administration of intracoronary nitroglycerin showing excellent result with no residual stenosis in the proximal LAD, only mild residual disease distal to the bifurcation and excellent flow through the LAD and diagonal branch of TIMI grade 3. There was no evidence of compromise of the diagonal branch. Final angiography was performed in various projections confirming these findings. The patient did have chest discomfort with balloon inflations that resolved at the end of the case. He was then transferred to the holding area in stable condition. The sheath can be removed when the ACT returns to normal.  FINAL RESULTS: Successful PTCA and stent placement in the proximal LAD with reduction of 90% ulcerated narrowing to 0% with a 4.0 x 12 mm Express II stent. Dictated by:   Veneda Melter, M.D. Attending Physician:  Glennon Hamilton DD:  05/22/01 TD:  05/24/01 Job: [id number]

## 2010-07-28 NOTE — Op Note (Signed)
[place]. [facility]  Patient:    [name], [name] Visit Number: [id number] MRN: [id number]          Service Type: DSU Location: RCRM 2550 03 Attending Physician:  Cornell Barman Dictated by:   Lenard Galloway Chaney Malling, M.D. Proc. Date: 12/20/00 Admit Date:  12/20/2000 Discharge Date: 12/20/2000                             Operative Report  PREOPERATIVE DIAGNOSIS:  Avulsion biceps proximal radius left palm.  POSTOPERATIVE DIAGNOSIS:  Avulsion biceps proximal radius left palm.  OPERATION:  Reattachment distal biceps tendon to the radial tuberosity proximal end of the left radius using Mitec staple.  ANESTHESIA:  General.  SURGEON:  Rodney A. Chaney Malling, M.D.  PROCEDURE:  The patient was placed on the operating room table in the supine position.  A Pneumatic tourniquet was placed about the left upper arm.  The left arm was prepped with DuraPrep and draped out in the usual manner.  The arm was then wrapped out with an Esmarch.  The tourniquet was elevated. Lazy-S incision made in the antecubital area and dissection was carried down to the biceps fascia.  The biceps fascia was opened.  The biceps could be seen to be retracted.  There was a great deal of blood about the biceps.  The distal end of the biceps tendon which was dissected free was found to be markedly thickened and folded back on itself and scarred down.  The belly of the biceps was dissected free with the finger and some length was regained. Antecubital fossa was very carefully visualized.  The tunnel for the biceps could clearly be seen and it was dissected with the finger.  This was carried down to the proximity of the radius very easily.  The radial tuberosity could be palpated as the arm was supinated.  A two incision technique was selected. An incision was made over the posterior aspect of the elbow.  The anconeus was stripped off subperiosteally down to the interval between the radius  and ulna. With the finger in the depths of the wound in the antecubital area and through the posterior incision, the radial tubercle could be palpated.  The interval between the radius and ulna was then opened with a blunt instrument. Sutures were placed through the stump of the biceps and sutures brought out through the hole on the posterior lateral aspect of the elbow.  Traction was placed on the tendon and the tendon was shortened, markedly thickened and could not be brought between the radius and ulna.  Attempt was made to reattach through the second incision through the radial tubercle but the tendon simply was not long enough.  It was then selected to attach this anteriorly through the antecubital wound.  Army retractors were placed in the wound.  Excellent visualization of the radius could be seen.  A periosteal elevator was used to strip all superficial soft tissue from the radius itself.  A rongeur was used to scrape up the cortex and fish scale the cortex in this area.  A drill hole was placed in the radius and a large Mitec staple was placed through this drill hole.  There was excellent fixation.  Big heavy sutures were brought through the distal end of the biceps tendon.  This was pulled down to its anatomic site.  This was held in position as the knot was slipped down and tightened.  Several sutures were placed through the biceps tendon itself and sutured back down on the radius. Excellent fixation was achieved.  Very carefully that was kept flexed.  The subcutaneous tissues were closed with 2-0 Vicryl.  Skin was closed with stainless steel staples.  Large bulky compressive dressing with elbow flexed to 90 degrees and posterior plastic splint was applied.  Patient was then placed in a sling and returned to the recovery room in excellent condition.  This procedure went extremely well.  DRAINS:  None.  COMPLICATIONS:  None. Dictated by:   Lenard Galloway Chaney Malling,  M.D. Attending Physician:  Cornell Barman DD:  12/20/00 TD:  12/21/00 Job: [id number]

## 2010-07-28 NOTE — Cardiovascular Report (Signed)
NAMEINDIE, [name]                           ACCOUNT NO.:  [id number]   MEDICAL RECORD NO.:  [id number]                   PATIENT TYPE:  INP   LOCATION:  3738                                 FACILITY:  [facility]   PHYSICIAN:  Charlton Haws, M.D.                  DATE OF BIRTH:  [birth year]   DATE OF PROCEDURE:  05/17/2003  DATE OF DISCHARGE:                              CARDIAC CATHETERIZATION   PROCEDURE:  Cardiac catheterization.   CARDIOLOGIST:  Charlton Haws, M.D.   INDICATIONS:  Previous stent to the left anterior descending in 2003 with  recurrent angina.   DESCRIPTION OF PROCEDURE:  Today, catheterization is done from the right  femoral artery.  The patient was somewhat nervous and was sedated with 5 mg  of Versed.   FINDINGS:  1. The left main coronary artery had 20% discrete stenosis.  2. The left anterior descending artery had a 70-80% lesion which appeared to     be just at the distal portion of the previous stent to the left anterior     descending, the ostium of a large first diagonal branch had a 40%     discrete lesion.  3. The distal left anterior descending was somewhat small.  4. The circumflex coronary artery was normal.  5. The right coronary artery had a long tubular 40-50% lesion in the mid     vessel.  6. RAO ventriculography was normal.  The ejection fraction was 60%.  There     was no gradient across the aortic valve and no mitral regurgitation.     Aortic pressure was 114/88.  LV pressure was 114/10.   IMPRESSION:  Films were reviewed with Dr. Riley Kill.  He will proceed with  intervention to the LAD/diagonal system.                                               Charlton Haws, M.D.    PN/MEDQ  D:  05/17/2003  T:  05/17/2003  Job:  [id number]   cc:   [facility]   Learta Codding, M.D.   Wyvonnia Lora  [address]  [place]  [place] [address]  Fax: [phone]

## 2010-07-28 NOTE — Op Note (Signed)
[name], GRIVAS                 ACCOUNT NO.:  [id number]   MEDICAL RECORD NO.:  [id number]          PATIENT TYPE:  AMB   LOCATION:  DAY                          FACILITY:  [facility]   PHYSICIAN:  Jene Every, M.D.    DATE OF BIRTH:  Nov 28, 1955   DATE OF PROCEDURE:  05/31/2005  DATE OF DISCHARGE:                                 OPERATIVE REPORT   PREOPERATIVE DIAGNOSIS:  Medial meniscal tear of the right knee.   POSTOPERATIVE DIAGNOSIS:  Medial meniscal tear of the right knee, grade 3  chondromalacia of the patella, femoral sulcus, medial femoral condyle,  medial tibial plateau, loose bodies.   PROCEDURE PERFORMED:  1.  Right knee arthroscopy with partial medial meniscectomy.  2.  Removal of loose bodies, right knee.  3.  Chondroplasty of the patella, femoral sulcus, medial femoral condyle,      and medial tibial plateau.   ANESTHESIA:  General.   ASSISTANT:  None.   BRIEF HISTORY/INDICATIONS:  This gentleman is 55 years old with refractory  knee pain, locking and giving way.  He had an MRI which indicated medial  meniscus tear and degenerative changes of the medial compartment.  It was  indicated for debridement of the medial meniscus for mechanical symptoms and  of the knee for inflammatory symptoms.  The risks and benefits have been  discussed, including bleeding, infection, damage to vascular structures, no  change in symptoms, worsening symptoms, the need for repeat debridement or  total knee arthroplasty in the future, etc.   TECHNIQUE:  With the patient in supine position after the induction of  adequate general anesthesia and 1 gm of Kefzol, the right lower extremity  was prepped and draped in the usual sterile fashion.  A lateral parapatellar  portal and a superior medial parapatellar portal was fashioned with a #11  blade.  Ingress cannula atraumatically placed.  The irrigant was utilized to  insufflate the joint.   Under direct visualization, a medial  parapatellar portal was fashioned with  a #11 blade after localization with an 18 gauge needle, sparing the medial  meniscus.  Noted initially was some patellofemoral chondromalacia and normal  patellofemoral tracking.  There were grade 3 changes of the sulcus, the  patella, of the medial compartment.  Extensive grade 3 changes in the  femoral condyle, less so of the tibial plateau.  There was a complex tear  through the posterior third of the medial meniscus.  This was unstable.  I  introduced a basket rongeur and resected the medial meniscus to a stable  base.  This was further contoured with a 4.2 coude shaver.  This remnant was  then stable to probe palpation.  This was taken back to the junction of the  inner two-thirds and the outer third.  Prior to this, loose bodies were  noted throughout the joint.  These were evacuated and retrieved with a  shaver.  Also palpated the posterior popliteal space for any loose bodies as  well.   Next, a chondroplasty was performed of the femoral condyle and the tibial  plateau.  There were chondral flap tears of the femoral condyle.  I was  unable to detect any grade 4 changes.  I probed the femoral condyle and the  tibial plateau.  The remainder of the meniscus seemed stable to probe  palpation.  The ACL and the PCL were essentially unremarkable.  The lateral  compartment was essentially unremarkable as well with some minor grade 2  changes, but the meniscus was stable to probe palpation without evidence of  tear.  I examined the top and the undersurface of the meniscus without  evidence of a tear.   After the chondroplasty of the medial femoral condyle and tibial plateau was  performed, I examined the ACL and PCL.  They were unremarkable.  Again, the  lateral compartment was unremarkable, and the lateral meniscus was probed.  No evidence of the tear above and below.  The chondroplasty of the tail end  of the sulcus was performed.  The gutters  were unremarkable.  Re-examined  all compartments.  Copiously lavaged.  No residual.  Loose cartilaginous  debris was noted.  Again, I checked the medial meniscus.  The remnant was  stable and through flexion and extension.  I then removed all  instrumentation.  The portals were closed with 4-0 nylon simple sutures, and  0.25% Marcaine with epinephrine was infiltrated into the joint.  The wound  was cleaned and dressed sterilely.  He was awakened without difficulty and  transported to the recovery room in satisfactory condition.   Patient tolerated the procedure well with no complications.      Jene Every, M.D.  Electronically Signed     JB/MEDQ  D:  06/01/2005  T:  06/03/2005  Job:  [id number]

## 2010-07-28 NOTE — Discharge Summary (Signed)
[name], [name]                           ACCOUNT NO.:  [id number]   MEDICAL RECORD NO.:  [id number]                   PATIENT TYPE:  INP   LOCATION:  6531                                 FACILITY:  [facility]   PHYSICIAN:  Learta Codding, M.D.                 DATE OF BIRTH:  May 14, 1955   DATE OF ADMISSION:  05/13/2003  DATE OF DISCHARGE:  05/18/2003                                 DISCHARGE SUMMARY   This is a 55 year old male who presented to [facility] emergency room  for evaluation of chest pain.  The patient had a history of coronary artery  disease with an LAD stent placed in March 2003.  The patient had stress  Cardiolite performed May 04, 2003, that revealed mild inferior ischemia  with an ejection fraction of 56%.  It was felt to be a low-risk study, and  medical therapy was recommended.   The patient was transferred from [facility] emergency room to [facility] for further evaluation.   PAST MEDICAL HISTORY:  Significant for coronary artery disease with stent  placement in the LAD in March 2003.  He had a 50% RCA lesion at that time as  well.  He had a preserved ejection fraction.  He has a history of diabetes  mellitus, elevated lipids, a history of mild elevation of his liver function  tests, history of obstructive sleep apnea treated with CPAP.   ALLERGIES:  No known drug allergies.   SOCIAL HISTORY:  The patient is married and has three children.  He lives in  [place].  He does not use tobacco.  He uses alcohol occasionally.  He works at  the [employer].   FAMILY HISTORY:  His father died from an MI at age 57.  His mother died in  her 10s from sepsis.  He has a sister with cancer that is in remission.   HOSPITAL COURSE:  As noted, this patient was transferred to [facility] from [facility] emergency room on May 13, 2003, or  evaluation of chest pain.  He was treated with IV nitroglycerin and heparin.   The  patient had a chest CT that was consistent with bilateral pulmonary  emboli; however, his D-dimer was negative.  His lower extremity Dopplers  were negative.  The patient underwent pulmonary angiography on May 14, 2003.  This showed no evidence of pulmonary emboli.  It was felt that the CT  scan was a false positive.   The patient underwent cardiac catheterization on May 17, 2003, performed by  Dr. [place] Emms.  He was found to have normal left ventricle with an ejection  fraction of 60%.  He had a 70-80% LAD lesion at the previous stent site.  He  had a 40% first diagonal.  The circumflex was normal.  The RCA had a 40-50%  mid-lesion.   The patient  underwent percutaneous intervention using a cutting balloon on  the LAD lesion, reducing it from 80% to approximately 30%.  The patient  tolerated this well.  He felt much improved afterwards.  Arrangements were  made to discharge the patient on the following day on May 18, 2003, in  improved and stable condition.   LABORATORY DATA:  A CBC on the day of discharge revealed hemoglobin 16.8,  hematocrit 49, WBC 7000, platelets 295,000.  A chemistry profile on the 8th  revealed BUN 10, creatinine 1.1, potassium 4.0, glucose 210.  Cardiac  enzymes were noted to be negative.  Hemoglobin A1C was 8.6.  D-dimer was  less than 0.22.  A lipid profile revealed cholesterol 191, triglycerides  189, HDL 44, LDL was 109.  Please see results of chest CT and pulmonary  angiogram as noted above.   DISCHARGE MEDICATIONS:  The patient was discharged on the same medications he was on prior to  admission.  This included:  1. Metformin 850 mg daily, which was not to be resumed until Friday     following his Tuesday discharge.  2. Toprol XL 12.5 mg daily.  3. Foltx daily.  4. Altace 5 mg daily.  5. Lipitor 40 mg at bedtime.  6. Paxil 20 mg daily.  7. Aspirin daily.  8. Vitamin E daily.  9. Lantus insulin 25 units each evening prior to going to work.  10.       Nitroglycerin p.r.n.   The patient was told to avoid any strenuous activity or driving for two  days.  He was not to lift more than 10 pounds for one week.  He was told he  could return to work Sunday evening.   Diet was to be low-salt, low-fat diabetic diet.  He was told to call the  office if he had any increased pain, swelling, or bleeding from his groin.  He was to follow up at the heart center in [place] March 23 at 1:30 p.m.  He  was to see Dr. Tapper as needed or as scheduled.   PROBLEM LIST AT THE TIME OF DISCHARGE:  1. Chest pain, myocardial infarction ruled out.  2. Cardiac catheterization performed on May 17, 2003, revealing a 70-80% in-     stent restenosis with subsequent percutaneous coronary intervention using     a cutting balloon, reducing the lesion to approximately 30%.  3. Previous stent to the left anterior descending artery performed in March     20 03, with a residual 50% right coronary artery, preserved left     ventricular function.  4. Diabetes mellitus.  5. Elevated lipids.  6. History of mildly elevated liver function tests.  7. History of obstructive sleep apnea, treated with continuous positive     airway pressure.  8. Electrocardiogram consistent with pericarditis; however, it was felt that     the patient did not have pericarditis.  9. False positive CT scan for pulmonary emboli with negative pulmonary     angiogram.  10.      Exercise Cardiolite performed May 04, 2003, revealing mild     inferior ischemia.   Based on the patient's recent lipid profile and his coronary artery disease,  the patient's Lipitor should probably be increased to 80 mg daily if his  liver function tests are not abnormal.      Delton See, P.A. [facility]                  Learta Codding, M.D.  DR/MEDQ  D:  05/18/2003  T:  05/18/2003  Job:  [id number]   cc:   Wyvonnia Lora  [address]  [place]  [place] [address] Fax: [phone]   [facility], [place], [place]

## 2010-07-28 NOTE — Cardiovascular Report (Signed)
[name], TINGLER                           ACCOUNT NO.:  [id number]   MEDICAL RECORD NO.:  [id number]                   PATIENT TYPE:  INP   LOCATION:  6531                                 FACILITY:  [facility]   PHYSICIAN:  Arturo Morton. Riley Kill, M.D.             DATE OF BIRTH:  August 11, 1955   DATE OF PROCEDURE:  05/17/2003  DATE OF DISCHARGE:                              CARDIAC CATHETERIZATION   INDICATIONS:  Mr. Dollard is a very delightful gentleman who presents with  recurrent chest discomfort.  He had prior stenting of the LAD just prior to  the LAD diagonal bifurcation.  He had a complicated hospital workup.  Included in this workup has included CT scan and bilateral pulmonary  angiography through the internal jugular.  This was done because of an  abnormal CT scan that suggested pulmonary emboli.  With this being negative,  Dr. Andee Lineman felt that he should have catheterization and this was done by Dr.  [place] Emms demonstrating a high grade area of stenosis just after the  bifurcation in the LAD proper.  Based on this, it was felt that percutaneous  intervention was needed and Dr. Andee Lineman reviewed the films and was in  agreement.   Prior to the procedure, I brought the patient and his wife into the holding  area.  We reviewed the pictures and I discussed the nature of the  interventional problem.  Our recommendation was for an attempted  percutaneous intervention using double wire technique and using a cutting  balloon to open the high grade lesion.  We were to use a stent only  provisionally given the bifurcation stenosis and the fact that the diagonal  is actually larger in distribution than the LAD proper.  The patient  understood and the risks were explained.  He was brought to the lab for  further evaluation.   PROCEDURES:  Percutaneous cutting balloon angioplasty of the left anterior  descending artery.   DESCRIPTION OF PROCEDURE:  The patient was brought to the catheterization  lab and prepped and draped in the usual fashion.  His 6 French sheath was  exchanged for a 7 [place] sheath following double glove technique in  preparation.  Following this, a JL-3.5 guiding catheter was used to intubate  the left main.  We used double wire technique.  The double wire technique  included the diagonal and the LAD.  After adequate anticoagulation with  heparin and integrilin, the lesion was cut using a 2.5 mm cutting balloon by  10 mm in length.  This was followed by a 3.0 x 10 cutting balloon  dilatation.  There was marked improvement in the appearance of the artery  from about 80 down to about 30%.  With this, it was felt that this was an  optimal result given the bifurcation location and all catheters were  subsequently removed and the femoral sheath sewn into place.  He was taken  to the holding area in satisfactory clinical condition.   ANGIOGRAPHIC DATA:  1. The left main coronary was free of critical disease.  2. The left anterior descending tapers at the previous stent site to about     30%.  There is not high grade restenosis.  Leading into the diagonal,     there is probably 30% segmental narrowing.  The LAD ostium has about 80%     just at the take off of the LAD.  Following cutting balloon angioplasty,     this was reduced to about 30-40% with a fairly smooth angiographic     result.  Just after the 30-40% area, is an area of marked aneurysmal     dilatation so actual gradient has been somewhat difficult and the LAD     itself compared to the native LAD looks really quite good.   CONCLUSIONS:  Successful percutaneous intervention using cutting balloon  angioplasty of the left anterior descending artery at the left anterior  descending bifurcation.   DISPOSITION:  The patient should be treated with aspirin and Plavix.  It  should be noted also that the patient had identical reproduction of symptoms  up to his baseline symptom with balloon inflation.  Hopefully,  he will  continue to improve.  The risk of need for repeat intervention is increased  in the absence of a drug-eluting stent, but hopefully he will do well with  this.                                               Arturo Morton. Riley Kill, M.D.    TDS/MEDQ  D:  05/17/2003  T:  05/17/2003  Job:  [id number]   cc:   Learta Codding, M.D.   Wyvonnia Lora  [address]  [place]  [place] [address]  Fax: [phone]

## 2010-07-28 NOTE — H&P (Signed)
[name], [name]                           ACCOUNT NO.:  [id number]   MEDICAL RECORD NO.:  [id number]                   PATIENT TYPE:  INP   LOCATION:                                       FACILITY:  [facility]   PHYSICIAN:  Learta Codding, M.D.                 DATE OF BIRTH:  Sep 27, 1955   DATE OF ADMISSION:  05/13/2003  DATE OF DISCHARGE:                                HISTORY & PHYSICAL   CHIEF COMPLAINT:  Chest pain.   HISTORY OF PRESENT ILLNESS:  This is a 55 year old male who presents to  [facility] Emergency Room today for evaluation of chest pain.  The  patient had a stent placed in his LAD in March of 2003.  He recently had an  exercise Cardiolite performed on May 04, 2003, secondary to recurrent  chest pain symptoms.  His Cardiolite showed mild inferior ischemia and EF  56%.  It was felt to be a low-risk study.  Medical therapy was to be  continued.   The patient states that approximately three weeks ago he had chest tightness  similar to what he had experienced prior to his stent.  The episodes were  lasting about five minutes at a time.  Last night he worked third shift,  which is his usual shift.  He woke up around 11 a.m. today and had chest  pain.  He was concerned.  He took a shower.  He called the [place] office.  He was told to take nitroglycerin.  He had never taken nitroglycerin before.  He did take the nitroglycerin as instructed.  There was no significant  change in his symptoms.  He presented to the [facility] Emergency  Room.  He received IV morphine there with some relief in his pain.  Initially he described his pain as being a 6 on a 1-10 scale.  It is  currently a 4 on a 1-10 scale.  He has had some mild shortness of breath  associated with this discomfort.   PAST MEDICAL HISTORY:  Significant for:  1. Coronary artery disease with a stent to the LAD in March of 2003.  He had     a 50% RCA lesion at that time as well.  His ejection  fraction is     preserved.  2. Diabetes mellitus.  3. Elevated lipids.  4. History of mild elevation in his liver function tests.  5. History of obstructive sleep apnea and he uses CPAP.   ALLERGIES:  No known drug allergies.   CURRENT MEDICATIONS:  1. Metformin 850 mg daily.  2. Toprol XL 12.5 mg daily.  3. Foltx daily.  4. Altace 5 mg daily.  5. Lipitor 40 mg at bedtime.  6. Paxil 20 mg daily.  7. Aspirin daily.  8. Vitamin E daily.  9. Lantus insulin 25 units each evening prior to going to work.  SOCIAL HISTORY:  The patient is married.  He has three children.  [place]  He does not use tobacco.  He uses alcohol  occasionally.  He works at the [employer].   FAMILY HISTORY:  His father died from an MI at age 71.  His mother died in  her 63s from sepsis.  He sister with cancer that is in remission.   REVIEW OF SYSTEMS:  The complete review of systems was totally negative,  except for the following:  He has had chest pain and shortness of breath as  described above.  He has a history of anxiety and depression.  Otherwise the  review of systems was negative.   PHYSICAL EXAMINATION:  GENERAL APPEARANCE:  A pleasant, 55 year old, well-  developed, well-nourished, white male in no acute distress.  VITAL SIGNS:  Blood pressure 134/87, pulse 89.  HEENT:  Unremarkable.  NECK:  No bruits.  No jugular venous distention.  HEART:  Regular rate and rhythm without murmur.  LUNGS:  Clear.  ABDOMEN:  Obese, soft, and nontender.  EXTREMITIES:  Pulses intact without edema.  NEUROLOGIC:  Exam is grossly intact.  SKIN:  Warm and dry.   LABORATORY DATA:  The chest x-ray was within normal limits per the ER  physician.  An EKG shows sinus rhythm, rate 98 beats per minute, first  degree AV block, and minimal ST elevation in V2-3.  An INR was 1.1 and a PTT  was 22.9.  A CBC revealed hemoglobin 15.8, hematocrit 46.7, WBC 10,200, and  platelets 239,000.  A  chemistry profile revealed BUN 11, creatinine 1.0,  potassium 3.8, sodium 135, and glucose 238.  Cardiac enzymes:  CK was 141,  MB 4.4, index 3.1, and troponin less than 0.01.   IMPRESSION:  1. Chest pain.  2. Known coronary artery disease with previous stent to the left anterior     descending.  3. Preserved left ventricular function.  4. Exercise Cardiolite performed on May 04, 2003, revealing a question     of mild inferior ischemia.  5. Diabetes mellitus.  6. Elevated lipids.  7. History of mildly elevated liver function tests.  8. History of obstructive sleep apnea.   PLAN:  We will treat the patient with IV nitroglycerin and IV heparin to try  to relieve his symptoms of pain.  He has already had IV Lopressor and  morphine.  We will monitor his blood pressure closely.  We will repeat the  EKG at this time and continue to cycle his enzymes.  He will be transferred  to [facility] for cardiac  catheterization tomorrow, sooner if his pain is not relieved.  We may need  to consider Integrillin if his enzymes are elevated.  His metformin will be  held in anticipation of his catheterization.  His Lantus insulin will also  be held for the time being.      Delton See, P.A. [facility]                  Learta Codding, M.D.    DR/MEDQ  D:  05/13/2003  T:  05/13/2003  Job:  [id number]   cc:   Wyvonnia Lora  [address]  [place]  [place] [address]  Fax: [phone]

## 2010-07-28 NOTE — Discharge Summary (Signed)
. [facility]  Patient:    [name], [name] Visit Number: [id number] MRN: [id number]          Service Type: MED Location: [id number] Attending Physician:  Glennon Hamilton Dictated by:   Joellyn Rued, P.A.-C. Admit Date:  05/21/2001 Discharge Date: 05/23/2001   CC:         Dr. Diona Browner at [facility], [address], [place], [place]  [address]                  Referring Physician Discharge [place]  DATE OF BIRTH:  [birth year]  SUMMARY OF HISTORY:  [name] is a 55 year old white male who was seen in consultation in our office on May 21, 2001 for chest discomfort.  He describes at least a three to four-day history of sudden onset of substernal tightness/heaviness radiating across his chest, exacerbated by exertion.  It will last four to five minutes and is relieved with rest.  He has also noted some mild belching and generalized weakness.  Denies any other associated symptoms.  His history is notable for hyperlipidemia x15 years, diabetes for one year, early family history of coronary artery disease, and an increased amount of stress at work at the [employer].  He was recommended cardiac catheterization.  LABORATORY DATA:  At [facility], CK and troponin were negative.  Sodium 135, potassium 3.7, BUN 15, creatinine 1.0, glucose 241.  PT 12.8, PTT 25.  EKG showed normal sinus rhythm without acute changes.  Post procedure H&H was 15.4 and 43.2, platelets 225, wbcs 7.5.  Sodium 140, potassium 3.9, BUN 10, creatinine 0.9, glucose 144.  CK total and MB were negative post procedure.  HOSPITAL COURSE:  Patient was admitted to [facility] on March 12.  Cardiac catheterization performed on March 13 by Dr. Chales Abrahams revealed an EF of 55%, some mid anterior hypokinesis.  He had a 50% mid RCA and a 90% proximal LAD at the diagonal one.  Dr. Chales Abrahams performed angioplasty stenting to the proximal LAD. The procedure was performed without  difficulty.  Post sheath removal and bedrest he was ambulating the hall without difficulty.  Post procedure he did have some soreness in his chest that did not resemble his preadmission symptoms.  On March 14 after reviewing, Dr. Chales Abrahams felt that he could be discharged home.  DISCHARGE DIAGNOSES: 1. Unstable angina status post angioplasty stenting of the proximal left    anterior descending artery as previously described. 2. History as previously.  DISPOSITION:  He is discharged home.  MEDICATIONS: 1. Plavix 75 mg q.d. for four weeks. 2. Coated aspirin 325 mg q.d. 3. Altace 5 mg q.d. 4. He was instructed he may resume his Glucophage on Saturday, 850 mg q.d. 5. Lipitor 10 mg q.h.s. 6. Sublingual nitroglycerin as needed.  ACTIVITY:  He was advised no lifting, driving, sexual activity, or heavy exertion for two days.  DIET:  Maintain low salt/fat/cholesterol ADA diet.  WOUND CARE:  If he had any problems with his catheterization site he was instructed to call.  FOLLOW-UP:  He will see Dr. Orson Gear P.A. on June 05, 2001 at 2:30 p.m. Dictated by:   Joellyn Rued, P.A.-C. Attending Physician:  Glennon Hamilton DD:  05/23/01 TD:  05/23/01 Job: [id number]

## 2010-12-18 LAB — CBC
HCT: 42.5
Hemoglobin: 14.3
MCHC: 34.6
MCHC: 34.6
MCV: 90.9
Platelets: 211
RDW: 12.8

## 2010-12-18 LAB — COMPREHENSIVE METABOLIC PANEL
AST: 41 — ABNORMAL HIGH
Alkaline Phosphatase: 68
BUN: 10
GFR calc non Af Amer: >60
Potassium: 3.8

## 2010-12-18 LAB — CK TOTAL AND CKMB (NOT AT ARMC)
CK, MB: 3.2
CK, MB: 3.7
CK, MB: 4.5 — ABNORMAL HIGH
Relative Index: 3 — ABNORMAL HIGH
Relative Index: 3.3 — ABNORMAL HIGH
Total CK: 121
Total CK: 138

## 2010-12-18 LAB — BASIC METABOLIC PANEL
BUN: 8
CO2: 28
Chloride: 108
Creatinine, Ser: 0.88
Glucose, Bld: 135 — ABNORMAL HIGH
Potassium: 4

## 2010-12-18 LAB — TROPONIN I
Troponin I: 0.01
Troponin I: 0.02

## 2010-12-18 LAB — LIPID PANEL
HDL: 30 — ABNORMAL LOW
Total CHOL/HDL Ratio: 5.9
Triglycerides: 269 — ABNORMAL HIGH

## 2015-07-21 ENCOUNTER — Other Ambulatory Visit: Payer: Self-pay | Admitting: Specialist

## 2015-07-29 ENCOUNTER — Ambulatory Visit
Admission: RE | Admit: 2015-07-29 | Discharge: 2015-07-29 | Disposition: A | Discharge status: Home / Self Care | Payer: 59 | Source: Ambulatory Visit | Attending: Specialist | Admitting: Specialist

## 2015-07-29 DIAGNOSIS — E119 Type 2 diabetes mellitus without complications: Secondary | ICD-10-CM | POA: Insufficient documentation

## 2015-07-29 DIAGNOSIS — R932 Abnormal findings on diagnostic imaging of liver and biliary tract: Secondary | ICD-10-CM | POA: Diagnosis not present

## 2015-07-29 DIAGNOSIS — E7800 Pure hypercholesterolemia, unspecified: Secondary | ICD-10-CM | POA: Diagnosis not present

## 2015-07-29 DIAGNOSIS — G4733 Obstructive sleep apnea (adult) (pediatric): Secondary | ICD-10-CM | POA: Diagnosis not present

## 2015-08-01 ENCOUNTER — Ambulatory Visit (INDEPENDENT_AMBULATORY_CARE_PROVIDER_SITE_OTHER): Payer: 59 | Admitting: Cardiology

## 2015-08-01 ENCOUNTER — Encounter: Payer: Self-pay | Admitting: *Deleted

## 2015-08-01 ENCOUNTER — Encounter: Payer: Self-pay | Admitting: Cardiology

## 2015-08-01 VITALS — BP 137/83 | HR 89 | Ht 71.0 in | Wt 309.0 lb

## 2015-08-01 DIAGNOSIS — Z136 Encounter for screening for cardiovascular disorders: Secondary | ICD-10-CM

## 2015-08-01 DIAGNOSIS — Z01810 Encounter for preprocedural cardiovascular examination: Secondary | ICD-10-CM

## 2015-08-01 DIAGNOSIS — R079 Chest pain, unspecified: Secondary | ICD-10-CM

## 2015-08-01 DIAGNOSIS — E785 Hyperlipidemia, unspecified: Secondary | ICD-10-CM

## 2015-08-01 DIAGNOSIS — I10 Essential (primary) hypertension: Secondary | ICD-10-CM

## 2015-08-01 DIAGNOSIS — I2510 Atherosclerotic heart disease of native coronary artery without angina pectoris: Secondary | ICD-10-CM | POA: Diagnosis not present

## 2015-08-01 NOTE — Patient Instructions (Addendum)
Your physician wants you to follow-up in: 6 MONTHS WITH DR. BRANCH You will receive a reminder letter in the mail two months in advance. If you don't receive a letter, please call our office to schedule the follow-up appointment.  Your physician has recommended you make the following change in your medication:   DECREASE ASPIRIN 81 MG DAILY  Your physician has requested that you have an echocardiogram. Echocardiography is a painless test that uses sound waves to create images of your heart. It provides your doctor with information about the size and shape of your heart and how well your heart's chambers and valves are working. This procedure takes approximately one hour. There are no restrictions for this procedure.  Your physician has requested that you regularly monitor and record your blood pressure readings at home FOR 1 WEEK AND CALL US WITH RESULTS. Please use the same machine at the same time of day to check your readings and record them to bring to your follow-up visit.    Thank you for choosing [facility]!!

## 2015-08-01 NOTE — Progress Notes (Addendum)
Patient ID: [name], male   DOB: 03-04-1956, 60 y.o.   MRN: [id number]         Clinical Summary [name] is a 60 y.o.male seen today as a new patient. He is referred by [name] for the following medical problems.   1. Preoperative evaluation - he is being considered for a bariatric surgery, followed at  [facility] - he is referred to day for follow up of his cardiac history and preoperative evaluation   2. CAD - history of prior stent to LAD, with repeat balloon angioplsty of LAD stent in 2005 - last cath 2008 with patent stent and vessels.  - no recent echo in system   - can have occasional left sided discomfort. Nonspecific discomfort, 2-3/10. Can occur at rest or with exertion. No other associated symptoms. Lasts about 1 minute. Occurs twice a month. Started about 1 year ago. Stable frequency, stable severity. - denies any SOB or DOE. Fairly sedentary lifestyle. Highest level of activity is using push mower 45 min to 1 hour. - no recent LE edema. No orthopnea - compliant with meds   3. HTN - does not check at home  4. 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     Past Medical History  Diagnosis Date  . Hypertension   . Diabetes mellitus without complication (HCC)   . Hyperlipidemia   . Coronary artery disease     stents placed 2003  . Sleep apnea     on CPAP     No Known Allergies   Current Outpatient Prescriptions  Medication Sig Dispense Refill  . aspirin 81 MG tablet Take 81 mg by mouth daily.    . ergocalciferol (VITAMIN D2) 50000 units capsule Take 50,000 Units by mouth once a week.    . gabapentin (NEURONTIN) 300 MG capsule Take 1 capsule by mouth 4 (four) times daily as needed.    Marland Kitchen HUMALOG 100 UNIT/ML injection Inject 30-40 Units into the skin as directed.    . insulin glargine (LANTUS) 100 UNIT/ML injection Inject 70 Units into the skin 2 (two) times daily.    .  INVOKANA 300 MG TABS tablet Take 1 tablet by mouth daily.    . metFORMIN (GLUCOPHAGE) 850 MG tablet Take 1 tablet by mouth 3 (three) times daily.    . metoprolol succinate (TOPROL-XL) 50 MG 24 hr tablet Take 1 tablet by mouth daily.    Marland Kitchen omega-3 acid ethyl esters (LOVAZA) 1 g capsule Take 1 g by mouth 4 (four) times daily.     . Potassium (POTASSIMIN PO) Take 1 tablet by mouth daily.    . ramipril (ALTACE) 10 MG capsule Take 1 capsule by mouth daily.    . rosuvastatin (CRESTOR) 40 MG tablet Take 1 tablet by mouth daily.    Marland Kitchen VICTOZA 18 MG/3ML SOPN Inject 1.8 mg into the skin daily.    . vitamin B-12 (CYANOCOBALAMIN) 1000 MCG tablet Take 1,000 mcg by mouth daily.    Marland Kitchen zinc gluconate 50 MG tablet Take 50 mg by mouth daily.    Marland Kitchen VASCEPA 1 g CAPS Take 2 capsules by mouth 2 (two) times daily. Reported on 08/01/2015     No current facility-administered medications for this visit.     Past Surgical History  Procedure Laterality Date  . Cardiac catheterization    . Tonsillectomy    . Shoulder arthroscopy Bilateral   . Knee  arthroscopy Bilateral   . Uvulla removal    . Biceps tendon repair       No Known Allergies    Family History  Problem Relation Age of Onset  . Diabetes Mother   . Diabetes Father   . Heart attack Father      Social History [name] reports that he has never smoked. He has never used smokeless tobacco. [name] has no alcohol history on file.   Review of Systems CONSTITUTIONAL: No weight loss, fever, chills, weakness or fatigue.  HEENT: Eyes: No visual loss, blurred vision, double vision or yellow sclerae.No hearing loss, sneezing, congestion, runny nose or sore throat.  SKIN: No rash or itching.  CARDIOVASCULAR: per HPI RESPIRATORY: No shortness of breath, cough or sputum.  GASTROINTESTINAL: No anorexia, nausea, vomiting or diarrhea. No abdominal pain or blood.  GENITOURINARY: No burning on urination, no polyuria NEUROLOGICAL: No headache, dizziness,  syncope, paralysis, ataxia, numbness or tingling in the extremities. No change in bowel or bladder control.  MUSCULOSKELETAL: No muscle, back pain, joint pain or stiffness.  LYMPHATICS: No enlarged nodes. No history of splenectomy.  PSYCHIATRIC: No history of depression or anxiety.  ENDOCRINOLOGIC: No reports of sweating, cold or heat intolerance. No polyuria or polydipsia.  Marland Kitchen.   Physical Examination Filed Vitals:   08/01/15 0828  BP: 137/83  Pulse: 89   Filed Weights   08/01/15 0828  Weight: 309 lb (140.161 kg)    Gen: resting comfortably, no acute distress HEENT: no scleral icterus, pupils equal round and reactive, no palptable cervical adenopathy,  CV: RRR, no m/r/g, no jvd Resp: Clear to auscultation bilaterally GI: abdomen is soft, non-tender, non-distended, normal bowel sounds, no hepatosplenomegaly MSK: extremities are warm, no edema.  Skin: warm, no rash Neuro:  no focal deficits Psych: appropriate affect   Diagnostic Studies 02/2007 cath CONCLUSION: 1. Preserved left ventricular function with ejection fraction in  excess of 60%. 2. Continued patency of the stent site at the site of subsequent  cutting balloon angioplasty. 3. Scattered coronary irregularities as noted above. 4. Probable poor control of diabetes mellitus.  RECOMMENDATIONS: We will likely recommend medical therapy. We will keep him overnight and recheck his enzymes.     Assessment and Plan  1. CAD/Chest pain - recent chest pain symptoms of unclear etiology. Given his CAD history and upcoming surgery we will further evaluate. We will initially obtain an echocardiogram to evaluate for any WMA or change in LVEF, pending results likely obtain exercise myoview 2 day protocol stress test - change ASA to 81mg  daily - EKG in clinic shows NSR, no ischemic changes.   2. HTN - above goal given his history of DM2, he will monitor at home and submit bp log in 1 week  3.  Hyperlipidemia - request labs from pcp - contniue current statin  4. Preoperative evaluation - he is being considered for intermediate risk bariatric surgery. He has history of CAD without cardiology follow up in several years. His current cardiac status is unclear. Will need further evaluation in setting of chest pain and upcoming surgery.  We will obtain echo and stress test as described above.    F/u 6 months. Pending echo results likely obtain 2 day exercise myoview.   Antoine PocheJonathan F. Dorsel Flinn, M.D.  Addendum 08/18/15 Echo shows normal cardiac function. Nuclear stress test without evidence of current ischemia. From cardiac standpoint recommend proceeding with planned bariatric surgery.   Dominga FerryJ Kessie Croston MD

## 2015-08-04 ENCOUNTER — Other Ambulatory Visit: Payer: Self-pay

## 2015-08-04 ENCOUNTER — Ambulatory Visit (INDEPENDENT_AMBULATORY_CARE_PROVIDER_SITE_OTHER): Payer: 59

## 2015-08-04 DIAGNOSIS — R079 Chest pain, unspecified: Secondary | ICD-10-CM | POA: Diagnosis not present

## 2015-08-09 ENCOUNTER — Telehealth: Payer: Self-pay | Admitting: *Deleted

## 2015-08-09 ENCOUNTER — Encounter: Payer: Self-pay | Admitting: *Deleted

## 2015-08-09 DIAGNOSIS — R079 Chest pain, unspecified: Secondary | ICD-10-CM

## 2015-08-09 NOTE — Telephone Encounter (Signed)
Pt aware and agreeable to stress test. Will order and forward to schedulers

## 2015-08-09 NOTE — Telephone Encounter (Signed)
-----   Message from Antoine PocheJonathan F Branch, MD sent at 08/09/2015 11:39 AM EDT ----- Echo overall looks good. I would like to get a 2 day protocol exercise nuclear stress test to evaluate for any potential new blockages. He will need to hold metoprolol day of the test.   Dominga FerryJ Branch MD

## 2015-08-17 ENCOUNTER — Encounter (HOSPITAL_COMMUNITY)
Admission: RE | Admit: 2015-08-17 | Discharge: 2015-08-17 | Disposition: A | Discharge status: Home / Self Care | Payer: 59 | Source: Ambulatory Visit | Attending: Cardiology | Admitting: Cardiology

## 2015-08-17 ENCOUNTER — Inpatient Hospital Stay (HOSPITAL_COMMUNITY): Admission: RE | Admit: 2015-08-17 | Payer: 59 | Source: Ambulatory Visit

## 2015-08-17 DIAGNOSIS — I510 Cardiac septal defect, acquired: Secondary | ICD-10-CM | POA: Insufficient documentation

## 2015-08-17 DIAGNOSIS — R079 Chest pain, unspecified: Secondary | ICD-10-CM

## 2015-08-17 MED ORDER — SODIUM CHLORIDE 0.9% FLUSH
INTRAVENOUS | Status: AC
  Administered 2015-08-17: 10 mL via INTRAVENOUS
  Filled 2015-08-17: qty 10

## 2015-08-17 MED ORDER — TECHNETIUM TC 99M TETROFOSMIN IV KIT
30.0000 | PACK | Freq: Once | INTRAVENOUS | Status: AC | PRN
  Administered 2015-08-17: 32 via INTRAVENOUS

## 2015-08-17 MED ORDER — REGADENOSON 0.4 MG/5ML IV SOLN
INTRAVENOUS | Status: DC
Start: 2015-08-17 — End: 2015-08-17
  Filled 2015-08-17: qty 5

## 2015-08-18 ENCOUNTER — Encounter (HOSPITAL_COMMUNITY): Payer: Self-pay

## 2015-08-18 ENCOUNTER — Encounter (HOSPITAL_COMMUNITY)
Admission: RE | Admit: 2015-08-18 | Discharge: 2015-08-18 | Disposition: A | Discharge status: Home / Self Care | Payer: 59 | Source: Ambulatory Visit | Attending: Cardiology | Admitting: Cardiology

## 2015-08-18 DIAGNOSIS — R079 Chest pain, unspecified: Secondary | ICD-10-CM | POA: Diagnosis not present

## 2015-08-18 LAB — NM MYOCAR MULTI W/SPECT W/WALL MOTION / EF
CHL CUP NUCLEAR SDS: 3
CHL CUP NUCLEAR SRS: 3
CHL CUP RESTING HR STRESS: 90 {beats}/min
CHL RATE OF PERCEIVED EXERTION: 12
CSEPED: 4 min
CSEPEDS: 6 s
CSEPEW: 7 METS
LV sys vol: 26 mL
LVDIAVOL: 80 mL (ref 62–150)
MPHR: 159 {beats}/min
Peak HR: 155 {beats}/min
Percent HR: 97 %
RATE: 0.33
SSS: 6
TID: 0.76

## 2015-08-18 MED ORDER — TECHNETIUM TC 99M TETROFOSMIN IV KIT
25.0000 | PACK | Freq: Once | INTRAVENOUS | Status: AC | PRN
  Administered 2015-08-18: 26 via INTRAVENOUS

## 2015-08-19 ENCOUNTER — Telehealth: Payer: Self-pay | Admitting: *Deleted

## 2015-08-19 NOTE — Telephone Encounter (Signed)
-----   Message from Antoine PocheJonathan F Branch, MD sent at 08/19/2015  8:13 AM EDT ----- Stress test looks good, no evidence of any new blockages. Overall his heart testing shows his heart is in good condition. He needs to discuss noncardiac causes of chest pain with his pcp. From a surgery standpoint his heart looks to be in good condition and I recommend proceeding. Can we forward my addended last clinic note to his surgeon.  Dominga FerryJ Branch MD

## 2015-08-19 NOTE — Telephone Encounter (Signed)
Pt aware and will forward clinic note to Dr. Smitty CordsBruce.

## 2015-08-30 ENCOUNTER — Telehealth: Payer: Self-pay | Admitting: *Deleted

## 2015-08-30 NOTE — Telephone Encounter (Signed)
-----   Message from Antoine PocheJonathan F Branch, MD sent at 08/26/2015 10:40 AM EDT ----- BP log reviewed, on average bp's above goal. Please increase ramipril to 20mg  daily  Dominga FerryJ Branch MD

## 2015-08-30 NOTE — Telephone Encounter (Signed)
Left message to return call 

## 2015-08-31 MED ORDER — RAMIPRIL 10 MG PO CAPS
20.0000 mg | ORAL_CAPSULE | Freq: Every day | ORAL | Status: DC
Start: 2015-08-31 — End: 2016-02-07

## 2015-08-31 NOTE — Telephone Encounter (Signed)
Patient notified.  Will send new 20mg  tablet to [facility] at patient's request.  Advised to continue to monitor over next several weeks & return log to office for MD review.  Will mail blank BP log to patient at his request.

## 2015-12-11 HISTORY — PX: BARIATRIC SURGERY: SHX1103

## 2015-12-20 ENCOUNTER — Telehealth: Payer: Self-pay | Admitting: Cardiology

## 2015-12-20 NOTE — Telephone Encounter (Signed)
Patient walked in asking for clearance to stop aspirin for his surgery OCt 30th.    Bio specialist asking to be faxed to her Burnell Blanks(Krystle B) at Fax #  [phone] Fax #  [phone]   Please call the patient when It is completed.

## 2015-12-20 NOTE — Telephone Encounter (Signed)
Pt is having bariatric surgery October 30th - requesting to be off ASA for 7 days prior and when to restart after surgery. Will forward to Dr. Wyline MoodBranch

## 2015-12-21 NOTE — Telephone Encounter (Signed)
Ok to hold aspirin for upcoming surgery. When to restart aspirin after is typically decided by the surgeon based on the associated risk of bleeding after. I'm ok with however many days he needs to be off  Dominga FerryJ Haniah Penny MD

## 2015-12-21 NOTE — Telephone Encounter (Signed)
LM for pt - routed to surgeon as requested.

## 2016-02-07 ENCOUNTER — Encounter: Payer: Self-pay | Admitting: Cardiology

## 2016-02-07 ENCOUNTER — Ambulatory Visit (INDEPENDENT_AMBULATORY_CARE_PROVIDER_SITE_OTHER): Payer: 59 | Admitting: Cardiology

## 2016-02-07 VITALS — BP 125/78 | HR 72 | Ht 71.0 in | Wt 266.2 lb

## 2016-02-07 DIAGNOSIS — E119 Type 2 diabetes mellitus without complications: Secondary | ICD-10-CM

## 2016-02-07 DIAGNOSIS — I2510 Atherosclerotic heart disease of native coronary artery without angina pectoris: Secondary | ICD-10-CM

## 2016-02-07 DIAGNOSIS — I10 Essential (primary) hypertension: Secondary | ICD-10-CM | POA: Diagnosis not present

## 2016-02-07 DIAGNOSIS — E782 Mixed hyperlipidemia: Secondary | ICD-10-CM

## 2016-02-07 DIAGNOSIS — Z794 Long term (current) use of insulin: Secondary | ICD-10-CM

## 2016-02-07 MED ORDER — METOPROLOL SUCCINATE ER 25 MG PO TB24: 12.5000 mg | ORAL_TABLET | Freq: Every day | ORAL | 3 refills | Status: DC

## 2016-02-07 MED ORDER — RAMIPRIL 2.5 MG PO CAPS: 2.5000 mg | ORAL_CAPSULE | Freq: Every day | ORAL | 3 refills | Status: DC

## 2016-02-07 MED ORDER — ROSUVASTATIN CALCIUM 40 MG PO TABS: 40.0000 mg | ORAL_TABLET | Freq: Every day | ORAL | 3 refills | Status: DC

## 2016-02-07 NOTE — Patient Instructions (Signed)
Your physician wants you to follow-up in: 6 MONTHS WITH DR. BRANCH  You will receive a reminder letter in the mail two months in advance. If you don't receive a letter, please call our office to schedule the follow-up appointment.  Your physician has recommended you make the following change in your medication:   STOP LOPRESSOR   START TOPROL XL 12.5 MG DAILY  RAMIPRIL 2.5 MG DAILY  CRESTOR 40 MG DAILY  Your physician has requested that you regularly monitor and record your blood pressure readings at home FOR 2 WEEKS AND CALL IN READINGS. Please use the same machine at the same time of day to check your readings and record them to bring to your follow-up visit.  Thank you for choosing [facility]!!

## 2016-02-07 NOTE — Progress Notes (Signed)
Clinical Summary [name] is a 60 y.o.male seen today for f/u of the following medical problems.   1. Obesity - s/p bariatric surgery Oct 30, tolerated well - he has lost nearly 50 lbs according to our records    2. CAD - history of prior stent to LAD, with repeat balloon angioplsty of LAD stent in 2005 - last cath 2008 with patent stent and vessels.  - 07/2015 echo LVEF 60-65% and now WMAs. 08/2015 nuclear stress test without ischemia.  - no recent SOB or DOE - compliant with meds. Stopped ramipril due to low bp's at home around Oct 30. He changed Toprol to Lopressor because could not crush pills after bariatric surgery. Currently on lopressor 12.5mg  bid.    3. HTN - does not check at home regularlty - med changes as described above after recent bariatric surgery  4. Hyperlipidemia - reports recent labs with pcp - off fish oil and crestor, after bariatric surgery all his meds had to be able to be crushed. He reports his TGs are 250 by last check   5. 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  Past Medical History:  Diagnosis Date  . Coronary artery disease    stents placed 2003  . Diabetes mellitus without complication (HCC)   . Hyperlipidemia   . Hypertension   . Sleep apnea    on CPAP     No Known Allergies   Current Outpatient Prescriptions  Medication Sig Dispense Refill  . aspirin 81 MG tablet Take 81 mg by mouth daily.    . ergocalciferol (VITAMIN D2) 50000 units capsule Take 50,000 Units by mouth once a week.    . gabapentin (NEURONTIN) 300 MG capsule Take 1 capsule by mouth 4 (four) times daily as needed.    Marland Kitchen. HUMALOG 100 UNIT/ML injection Inject 30-40 Units into the skin as directed.    . insulin glargine (LANTUS) 100 UNIT/ML injection Inject 70 Units into the skin 2 (two) times daily.    . INVOKANA 300 MG TABS tablet Take 1 tablet by mouth daily.    . metFORMIN  (GLUCOPHAGE) 850 MG tablet Take 1 tablet by mouth 3 (three) times daily.    . metoprolol succinate (TOPROL-XL) 50 MG 24 hr tablet Take 1 tablet by mouth daily.    Marland Kitchen. omega-3 acid ethyl esters (LOVAZA) 1 g capsule Take 1 g by mouth 4 (four) times daily.     . Potassium (POTASSIMIN PO) Take 1 tablet by mouth daily.    . ramipril (ALTACE) 10 MG capsule Take 2 capsules (20 mg total) by mouth daily. 180 capsule 3  . rosuvastatin (CRESTOR) 40 MG tablet Take 1 tablet by mouth daily.    Marland Kitchen. VASCEPA 1 g CAPS Take 2 capsules by mouth 2 (two) times daily. Reported on 08/01/2015    . VICTOZA 18 MG/3ML SOPN Inject 1.8 mg into the skin daily.    . vitamin B-12 (CYANOCOBALAMIN) 1000 MCG tablet Take 1,000 mcg by mouth daily.    Marland Kitchen. zinc gluconate 50 MG tablet Take 50 mg by mouth daily.     No current facility-administered medications for this visit.      Past Surgical History:  Procedure Laterality Date  . BICEPS TENDON REPAIR    . CARDIAC CATHETERIZATION    . KNEE ARTHROSCOPY Bilateral   . SHOULDER ARTHROSCOPY Bilateral   . TONSILLECTOMY    .  UVULLA REMOVAL       No Known Allergies    Family History  Problem Relation Age of Onset  . Diabetes Mother   . Diabetes Father   . Heart attack Father      Social History [name] reports that he has never smoked. He has never used smokeless tobacco. Mr. Levels has no alcohol history on file.   Review of Systems CONSTITUTIONAL: No weight loss, fever, chills, weakness or fatigue.  HEENT: Eyes: No visual loss, blurred vision, double vision or yellow sclerae.No hearing loss, sneezing, congestion, runny nose or sore throat.  SKIN: No rash or itching.  CARDIOVASCULAR: per HPI RESPIRATORY: No shortness of breath, cough or sputum.  GASTROINTESTINAL: No anorexia, nausea, vomiting or diarrhea. No abdominal pain or blood.  GENITOURINARY: No burning on urination, no polyuria NEUROLOGICAL: No headache, dizziness, syncope, paralysis, ataxia, numbness or  tingling in the extremities. No change in bowel or bladder control.  MUSCULOSKELETAL: No muscle, back pain, joint pain or stiffness.  LYMPHATICS: No enlarged nodes. No history of splenectomy.  PSYCHIATRIC: No history of depression or anxiety.  ENDOCRINOLOGIC: No reports of sweating, cold or heat intolerance. No polyuria or polydipsia.  Marland Kitchen   Physical Examination Vitals:   02/07/16 0824  BP: 125/78  Pulse: 72   Vitals:   02/07/16 0824  Weight: 266 lb 3.2 oz (120.7 kg)  Height: 5\' 11"  (1.803 m)    Gen: resting comfortably, no acute distress HEENT: no scleral icterus, pupils equal round and reactive, no palptable cervical adenopathy,  CV: RRR, no m/r/g, no jvd Resp: Clear to auscultation bilaterally GI: abdomen is soft, non-tender, non-distended, normal bowel sounds, no hepatosplenomegaly MSK: extremities are warm, no edema.  Skin: warm, no rash Neuro:  no focal deficits Psych: appropriate affect   Diagnostic Studies 02/2007 cath CONCLUSION: 1. Preserved left ventricular function with ejection fraction in  excess of 60%. 2. Continued patency of the stent site at the site of subsequent  cutting balloon angioplasty. 3. Scattered coronary irregularities as noted above. 4. Probable poor control of diabetes mellitus.  RECOMMENDATIONS: We will likely recommend medical therapy. We will keep him overnight and recheck his enzymes.   07/2015 echo Study Conclusions  - Left ventricle: The cavity size was normal. Wall thickness was   increased in a pattern of moderate LVH. Systolic function was   normal. The estimated ejection fraction was in the range of 60%   to 65%. Wall motion was normal; there were no regional wall   motion abnormalities. Left ventricular diastolic function   parameters were normal. - Aortic valve: Valve area (VTI): 4.3 cm^2. Valve area (Vmax): 4.12   cm^2. Valve area (Vmean): 3.19 cm^2. - Technically adequate study.  08/2015 Nuclear  stress test  Blood pressure demonstrated a hypertensive response to exercise.  There was no ST segment deviation noted during stress.  Defect 1: There is a small defect of mild severity present in the mid inferior, mid inferolateral and apical inferior location. This is consistent with soft tissue attenuation artifact. No areas of ischemia or infarction.  This is a low risk study.  Nuclear stress EF: 68%.  Assessment and Plan   1. CAD - no recent symptoms. Recent negative nuclear stress test  - continue current meds  2. HTN - recent med changes due to recent bariatric surgery as described above - will restart low dose ramipril in setting of CAD and DM2, change lopressor back to Toprol XL 12.5mg  daily.  - he will submit  bp log in 2 weeks  3. Hyperlipidemia - off statin due to recent bariatric surgery, all his meds had to be able to be crushed for several weeks - restart crestor 40mg  daily. Hold on restarting fish oil at this time, request labs from pcp. Suspect with significant weight loss and improved DM2 control TGs will normalize.   4.DM2 - decreasing insulin need with dramatic weight loss - restart ramipril   F/u 6months.    Dina RichJonathan Maryanna Stuber MD

## 2016-02-20 ENCOUNTER — Telehealth: Payer: Self-pay | Admitting: Cardiology

## 2016-02-20 MED ORDER — METOPROLOL SUCCINATE ER 25 MG PO TB24: 25.0000 mg | ORAL_TABLET | Freq: Every day | ORAL | 3 refills | Status: DC

## 2016-02-20 MED ORDER — RAMIPRIL 2.5 MG PO CAPS: 2.5000 mg | ORAL_CAPSULE | Freq: Every day | ORAL | 3 refills | Status: DC

## 2016-02-20 NOTE — Telephone Encounter (Signed)
Medication sent to pharmacy  

## 2016-02-20 NOTE — Telephone Encounter (Signed)
[name] would like to speak with someone about his medications.

## 2016-02-20 NOTE — Telephone Encounter (Signed)
Pt says he thought he was to take Toprol XL 25 mg daily which he has been doing although per LOV Toprol XL 12.5 mg was restarted. Pt brought by BP reading for the last few days: Will forward to Dr. Wyline MoodBranch if ok to send Toprol XL 25 mg   119/76 HR 68 114/67       78 123/73       68 127/68       73 112/58       79 117/67       71

## 2016-02-20 NOTE — Telephone Encounter (Signed)
Numbers look good, I would keep taking meds the way he is, Toprl XL 25mg  daily is fine   J Clancy Mullarkey MD

## 2016-12-21 NOTE — Progress Notes (Signed)
Cardiology Office Note   Date:  12/24/2016   ID:  [name], DOB 05-17-1955, MRN [id number]  PCP:  Louie Boston., MD  Cardiologist:   Dina Rich, MD  Chief Complaint  Patient presents with  . Coronary Artery Disease  . Hypertension    History of Present Illness: [name] is a 61 y.o. male who presents for ongoing assessment and management of coronary artery disease, history of stent to the LAD, and repeat balloon angioplasty of the LAD stent 2005, repeat catheterization 2008 revealing patent stent and vessels, hypertension, stop lisinopril at home due to hypotension, remains on beta blocker therapy with Lopressor 12.5 mg twice a day, hyperlipidemia, type 2 diabetes.  Was last seen in the office by Dr. Wyline Mood on 02/07/2016. At that time he was stable from a cardiac standpoint. Patient had recent bariatric surgery at the time of that office visit, he was restarted on low-dose foramina. All in the setting of CAD and diabetes had to, and Lopressor was changed to metoprolol XL 12.5 mg daily. He been taken off of statin due to his bariatric surgery as he was to have all of his medications crushed. He was restarted on Crestor 40 mg daily. Weight on that office visit 266 pounds.  He comes today having lost 150 lbs He had bariatric surgery one year ago with Dr. Kandice Hams, a "duodenal switch". He has been exercising, has been taken off of insulin, and is feeling very well. His only complaint is right LEE varicosities causing chronic LEE, R>L.    Past Medical History:  Diagnosis Date  . Coronary artery disease    stents placed 2003  . Diabetes mellitus without complication (HCC)   . Hyperlipidemia   . Hypertension   . Sleep apnea    on CPAP    Past Surgical History:  Procedure Laterality Date  . BARIATRIC SURGERY  12/2015  . BICEPS TENDON REPAIR    . CARDIAC CATHETERIZATION    . KNEE ARTHROSCOPY Bilateral   . SHOULDER ARTHROSCOPY Bilateral   . TONSILLECTOMY    . UVULLA  REMOVAL       Current Outpatient Prescriptions  Medication Sig Dispense Refill  . aspirin 81 MG tablet Take 81 mg by mouth daily.    Marland Kitchen b complex vitamins tablet Take 1 tablet by mouth daily.    . Calcium 500-100 MG-UNIT CHEW Chew 1 tablet by mouth 4 (four) times daily.    . ergocalciferol (VITAMIN D2) 50000 units capsule Take 50,000 Units by mouth once a week.    . metoprolol succinate (TOPROL XL) 25 MG 24 hr tablet Take 1 tablet (25 mg total) by mouth daily. 90 tablet 3  . Multiple Vitamin (MULTIVITAMIN) tablet Take 1 tablet by mouth 2 (two) times daily.    . ramipril (ALTACE) 2.5 MG capsule Take 1 capsule (2.5 mg total) by mouth daily. 90 capsule 3  . rosuvastatin (CRESTOR) 40 MG tablet Take 1 tablet (40 mg total) by mouth daily. 90 tablet 3  . vitamin B-12 (CYANOCOBALAMIN) 1000 MCG tablet Take 1,000 mcg by mouth daily.     No current facility-administered medications for this visit.     Allergies:   Patient has no known allergies.    Social History:  The patient  reports that he has never smoked. He has never used smokeless tobacco.   Family History:  The patient's family history includes Diabetes in his father and mother; Heart attack in his father.    ROS: All  other systems are reviewed and negative. Unless otherwise mentioned in H&P    PHYSICAL EXAM: VS:  BP 120/66   Pulse 72   Ht  (1.803 m)   Wt 220 lb (99.8 kg)   SpO2 95%   BMI 30.68 kg/m  , BMI Body mass index is 30.68 kg/m. GEN: Well nourished, well developed, in no acute distress  HEENT: normal  Neck: no JVD, carotid bruits, or masses Cardiac: RRR; no murmurs, rubs, or gallops,right LEE with varicosities noted. 2+ edema in the lower right, 1+ edema in the lower left.  Respiratory:  Clear to auscultation bilaterally, normal work of breathing GI: soft, nontender, nondistended, + BS MS: no deformity or atrophy  Skin: warm and dry, no rash Neuro:  Strength and sensation are intact Psych: euthymic mood,  full affect   EKG:  The ekg ordered today demonstrates NSR rate of 71 bpm. No ST T wave abnormalities.    Recent Labs: No results found for requested labs within last 8760 hours.    Lipid Panel    Component Value Date/Time   CHOL  02/20/2007 0313    178        ATP III CLASSIFICATION:  <200     mg/dL   Desirable  161-096  mg/dL   Borderline High  >=045    mg/dL   High   TRIG 409 (H) 81/19/1478 0313   HDL 30 (L) 02/20/2007 0313   CHOLHDL 5.9 02/20/2007 0313   VLDL 54 (H) 02/20/2007 0313   LDLCALC  02/20/2007 0313    94        Total Cholesterol/HDL:CHD Risk Coronary Heart Disease Risk Table                     Men   Women  1/2 Average Risk   3.4   3.3      Wt Readings from Last 3 Encounters:  12/24/16 220 lb (99.8 kg)  02/07/16 266 lb 3.2 oz (120.7 kg)  08/01/15 (!) 309 lb (140.2 kg)      Other studies Reviewed: Echocardiogram August 22, 2016 Left ventricle: The cavity size was normal. Wall thickness was   increased in a pattern of moderate LVH. Systolic function was   normal. The estimated ejection fraction was in the range of 60%   to 65%. Wall motion was normal; there were no regional wall   motion abnormalities. Left ventricular diastolic function   parameters were normal. - Aortic valve: Valve area (VTI): 4.3 cm^2. Valve area (Vmax): 4.12   cm^2. Valve area (Vmean): 3.19 cm^2. - Technically adequate study.  NM Stress Test 08/16/2016 Study Result    Blood pressure demonstrated a hypertensive response to exercise.  There was no ST segment deviation noted during stress.  Defect 1: There is a small defect of mild severity present in the mid inferior, mid inferolateral and apical inferior location. This is consistent with soft tissue attenuation artifact. No areas of ischemia or infarction.  This is a low risk study.  Nuclear stress EF: 68%.    ASSESSMENT AND PLAN:  1. CAD: Stent to the LAD with repeat cardiac cath in 2008 with patent stents, and NM stress  test determining low risk. He remains on metoprolol and lisinopril. He is asymptomatic with this currently. He works out at [employer] daily, 60 minutes on the elliptical and one hour of free weights. He denies chest pain or excessive dyspnea. No change or planned testing at this time. He will be  seen annually.   2. Hypertension: Excellent control currently. Labs are preformed by PCP and will be done in 3 weeks. We will request copies.   3. Hypercholesterolemia; Labs are to be drawn in 3 weeks. For now continue Crestor. He denies myalgia symptoms.   4. Chronic Varicosities: Lower extremities with edema and varicosities. He reports that he has been ruled out for DVT by his PCP this month. I have offered TED hose or referral to vein and vascular specialists. I have offered low dose diuretic to use prn. He wishes to see PCP for his recommendations.    Current medicines are reviewed at length with the patient today.    Labs/ tests ordered today include:  Bettey Mare. Liborio Nixon, ANP, AACC   12/24/2016 4:18 PM    [facility] [address], [place], [place] [address] Phone: [phone]; Fax: [phone]

## 2016-12-24 ENCOUNTER — Ambulatory Visit (INDEPENDENT_AMBULATORY_CARE_PROVIDER_SITE_OTHER): Payer: 59 | Admitting: Adult Health

## 2016-12-24 ENCOUNTER — Encounter: Payer: Self-pay | Admitting: Adult Health

## 2016-12-24 VITALS — BP 120/66 | HR 72 | Ht 71.0 in | Wt 220.0 lb

## 2016-12-24 DIAGNOSIS — I872 Venous insufficiency (chronic) (peripheral): Secondary | ICD-10-CM | POA: Diagnosis not present

## 2016-12-24 DIAGNOSIS — I10 Essential (primary) hypertension: Secondary | ICD-10-CM

## 2016-12-24 DIAGNOSIS — I2510 Atherosclerotic heart disease of native coronary artery without angina pectoris: Secondary | ICD-10-CM | POA: Diagnosis not present

## 2016-12-24 MED ORDER — METOPROLOL SUCCINATE ER 25 MG PO TB24: 25.0000 mg | ORAL_TABLET | Freq: Every day | ORAL | 3 refills | Status: DC

## 2016-12-24 MED ORDER — RAMIPRIL 2.5 MG PO CAPS: 2.5000 mg | ORAL_CAPSULE | Freq: Every day | ORAL | 3 refills | Status: DC

## 2016-12-24 MED ORDER — ROSUVASTATIN CALCIUM 40 MG PO TABS: 40.0000 mg | ORAL_TABLET | Freq: Every day | ORAL | 3 refills | Status: DC

## 2016-12-24 NOTE — Patient Instructions (Signed)

## 2017-03-14 DIAGNOSIS — I83891 Varicose veins of right lower extremities with other complications: Secondary | ICD-10-CM | POA: Diagnosis not present

## 2017-03-14 DIAGNOSIS — I8311 Varicose veins of right lower extremity with inflammation: Secondary | ICD-10-CM | POA: Diagnosis not present

## 2017-03-29 DIAGNOSIS — I83891 Varicose veins of right lower extremities with other complications: Secondary | ICD-10-CM | POA: Diagnosis not present

## 2017-03-29 DIAGNOSIS — I8311 Varicose veins of right lower extremity with inflammation: Secondary | ICD-10-CM | POA: Diagnosis not present

## 2017-04-23 DIAGNOSIS — I8311 Varicose veins of right lower extremity with inflammation: Secondary | ICD-10-CM | POA: Diagnosis not present

## 2017-04-23 DIAGNOSIS — I83891 Varicose veins of right lower extremities with other complications: Secondary | ICD-10-CM | POA: Diagnosis not present

## 2017-05-01 DIAGNOSIS — E113411 Type 2 diabetes mellitus with severe nonproliferative diabetic retinopathy with macular edema, right eye: Secondary | ICD-10-CM | POA: Diagnosis not present

## 2017-05-01 DIAGNOSIS — H3561 Retinal hemorrhage, right eye: Secondary | ICD-10-CM | POA: Diagnosis not present

## 2017-05-01 DIAGNOSIS — H43813 Vitreous degeneration, bilateral: Secondary | ICD-10-CM | POA: Diagnosis not present

## 2017-05-01 DIAGNOSIS — E113412 Type 2 diabetes mellitus with severe nonproliferative diabetic retinopathy with macular edema, left eye: Secondary | ICD-10-CM | POA: Diagnosis not present

## 2017-05-09 DIAGNOSIS — E113412 Type 2 diabetes mellitus with severe nonproliferative diabetic retinopathy with macular edema, left eye: Secondary | ICD-10-CM | POA: Diagnosis not present

## 2017-05-09 DIAGNOSIS — E113411 Type 2 diabetes mellitus with severe nonproliferative diabetic retinopathy with macular edema, right eye: Secondary | ICD-10-CM | POA: Diagnosis not present

## 2017-05-16 DIAGNOSIS — E1142 Type 2 diabetes mellitus with diabetic polyneuropathy: Secondary | ICD-10-CM | POA: Diagnosis not present

## 2017-05-16 DIAGNOSIS — E538 Deficiency of other specified B group vitamins: Secondary | ICD-10-CM | POA: Diagnosis not present

## 2017-05-16 DIAGNOSIS — E782 Mixed hyperlipidemia: Secondary | ICD-10-CM | POA: Diagnosis not present

## 2017-05-16 DIAGNOSIS — Z9884 Bariatric surgery status: Secondary | ICD-10-CM | POA: Diagnosis not present

## 2017-05-16 DIAGNOSIS — E559 Vitamin D deficiency, unspecified: Secondary | ICD-10-CM | POA: Diagnosis not present

## 2017-05-17 DIAGNOSIS — I8311 Varicose veins of right lower extremity with inflammation: Secondary | ICD-10-CM | POA: Diagnosis not present

## 2017-05-17 DIAGNOSIS — M7981 Nontraumatic hematoma of soft tissue: Secondary | ICD-10-CM | POA: Diagnosis not present

## 2017-05-20 DIAGNOSIS — E1142 Type 2 diabetes mellitus with diabetic polyneuropathy: Secondary | ICD-10-CM | POA: Diagnosis not present

## 2017-05-20 DIAGNOSIS — E1159 Type 2 diabetes mellitus with other circulatory complications: Secondary | ICD-10-CM | POA: Diagnosis not present

## 2017-05-20 DIAGNOSIS — E113313 Type 2 diabetes mellitus with moderate nonproliferative diabetic retinopathy with macular edema, bilateral: Secondary | ICD-10-CM | POA: Diagnosis not present

## 2017-05-20 DIAGNOSIS — I25110 Atherosclerotic heart disease of native coronary artery with unstable angina pectoris: Secondary | ICD-10-CM | POA: Diagnosis not present

## 2017-07-03 DIAGNOSIS — I10 Essential (primary) hypertension: Secondary | ICD-10-CM | POA: Diagnosis not present

## 2017-07-03 DIAGNOSIS — E785 Hyperlipidemia, unspecified: Secondary | ICD-10-CM | POA: Diagnosis not present

## 2017-07-03 DIAGNOSIS — Z9884 Bariatric surgery status: Secondary | ICD-10-CM | POA: Diagnosis not present

## 2017-07-11 DIAGNOSIS — H3562 Retinal hemorrhage, left eye: Secondary | ICD-10-CM | POA: Diagnosis not present

## 2017-07-11 DIAGNOSIS — H3561 Retinal hemorrhage, right eye: Secondary | ICD-10-CM | POA: Diagnosis not present

## 2017-07-11 DIAGNOSIS — E113411 Type 2 diabetes mellitus with severe nonproliferative diabetic retinopathy with macular edema, right eye: Secondary | ICD-10-CM | POA: Diagnosis not present

## 2017-07-11 DIAGNOSIS — E113412 Type 2 diabetes mellitus with severe nonproliferative diabetic retinopathy with macular edema, left eye: Secondary | ICD-10-CM | POA: Diagnosis not present

## 2017-08-15 DIAGNOSIS — I8311 Varicose veins of right lower extremity with inflammation: Secondary | ICD-10-CM | POA: Diagnosis not present

## 2017-08-15 DIAGNOSIS — E782 Mixed hyperlipidemia: Secondary | ICD-10-CM | POA: Diagnosis not present

## 2017-08-15 DIAGNOSIS — Z9884 Bariatric surgery status: Secondary | ICD-10-CM | POA: Diagnosis not present

## 2017-08-15 DIAGNOSIS — E1142 Type 2 diabetes mellitus with diabetic polyneuropathy: Secondary | ICD-10-CM | POA: Diagnosis not present

## 2017-08-15 DIAGNOSIS — I83891 Varicose veins of right lower extremities with other complications: Secondary | ICD-10-CM | POA: Diagnosis not present

## 2017-08-16 DIAGNOSIS — G4733 Obstructive sleep apnea (adult) (pediatric): Secondary | ICD-10-CM | POA: Diagnosis not present

## 2017-08-19 DIAGNOSIS — I8311 Varicose veins of right lower extremity with inflammation: Secondary | ICD-10-CM | POA: Diagnosis not present

## 2017-08-20 DIAGNOSIS — E1142 Type 2 diabetes mellitus with diabetic polyneuropathy: Secondary | ICD-10-CM | POA: Diagnosis not present

## 2017-08-20 DIAGNOSIS — E1159 Type 2 diabetes mellitus with other circulatory complications: Secondary | ICD-10-CM | POA: Diagnosis not present

## 2017-08-20 DIAGNOSIS — I2510 Atherosclerotic heart disease of native coronary artery without angina pectoris: Secondary | ICD-10-CM | POA: Diagnosis not present

## 2017-08-20 DIAGNOSIS — E113313 Type 2 diabetes mellitus with moderate nonproliferative diabetic retinopathy with macular edema, bilateral: Secondary | ICD-10-CM | POA: Diagnosis not present

## 2017-09-03 DIAGNOSIS — K760 Fatty (change of) liver, not elsewhere classified: Secondary | ICD-10-CM | POA: Diagnosis not present

## 2017-09-03 DIAGNOSIS — E113313 Type 2 diabetes mellitus with moderate nonproliferative diabetic retinopathy with macular edema, bilateral: Secondary | ICD-10-CM | POA: Diagnosis not present

## 2017-09-03 DIAGNOSIS — E782 Mixed hyperlipidemia: Secondary | ICD-10-CM | POA: Diagnosis not present

## 2017-09-03 DIAGNOSIS — E1143 Type 2 diabetes mellitus with diabetic autonomic (poly)neuropathy: Secondary | ICD-10-CM | POA: Diagnosis not present

## 2017-09-04 DIAGNOSIS — H3562 Retinal hemorrhage, left eye: Secondary | ICD-10-CM | POA: Diagnosis not present

## 2017-09-04 DIAGNOSIS — E113411 Type 2 diabetes mellitus with severe nonproliferative diabetic retinopathy with macular edema, right eye: Secondary | ICD-10-CM | POA: Diagnosis not present

## 2017-09-04 DIAGNOSIS — E113412 Type 2 diabetes mellitus with severe nonproliferative diabetic retinopathy with macular edema, left eye: Secondary | ICD-10-CM | POA: Diagnosis not present

## 2017-09-04 DIAGNOSIS — H3561 Retinal hemorrhage, right eye: Secondary | ICD-10-CM | POA: Diagnosis not present

## 2017-09-20 ENCOUNTER — Encounter (INDEPENDENT_AMBULATORY_CARE_PROVIDER_SITE_OTHER): Payer: Self-pay | Admitting: Vascular Surgery

## 2017-09-20 ENCOUNTER — Ambulatory Visit (INDEPENDENT_AMBULATORY_CARE_PROVIDER_SITE_OTHER): Payer: BLUE CROSS/BLUE SHIELD | Admitting: Vascular Surgery

## 2017-09-20 VITALS — BP 126/82 | HR 70 | Resp 17 | Ht 71.5 in | Wt 239.8 lb

## 2017-09-20 DIAGNOSIS — I10 Essential (primary) hypertension: Secondary | ICD-10-CM | POA: Diagnosis not present

## 2017-09-20 DIAGNOSIS — I83811 Varicose veins of right lower extremities with pain: Secondary | ICD-10-CM | POA: Diagnosis not present

## 2017-09-20 DIAGNOSIS — E785 Hyperlipidemia, unspecified: Secondary | ICD-10-CM | POA: Insufficient documentation

## 2017-09-20 NOTE — Assessment & Plan Note (Signed)
The patient is undergone a right leg laser ablation about 6 or 7 months ago.  Although there is some partial recanalization of this, it is not large or particularly recanalized to consider a repeat laser ablation.  I do believe foam sclerotherapy to the tributaries of the great saphenous vein would be an excellent treatment option and he is already had that approved.  We will schedule this in the near future at his convenience.

## 2017-09-20 NOTE — Assessment & Plan Note (Signed)
lipid control important in reducing the progression of atherosclerotic disease. Continue statin therapy  

## 2017-09-20 NOTE — Progress Notes (Signed)
Patient ID: [name], male   DOB: [birth year], 62 y.o.   MRN: [id number]  Chief Complaint  Patient presents with  . New Patient (Initial Visit)    varicose veins    HPI [name] Levi is a 62 y.o. male.  I am asked to see the patient by Dr. Guss Bunde for evaluation of symptomatic right leg varicose veins.  The patient has undergone extensive treatment for his venous insufficiency in the right leg over the past 8 months.  He had a laser ablation followed by 3 and foam sclerotherapy treatments on that right leg.  Despite all this appropriate and good treatment, he has developed and continued to have large painful residual varicosities particularly on the medial right calf area.  He was recently approved for more foam sclerotherapy but his provider in [place] became out of network.  As such, he has sought our office for treatment because we are in network.  He has varicosities on the left leg but they are not painful.  The swelling has improved with his venous interventions to this point.  No ulceration or infection.     Past Medical History:  Diagnosis Date  . Coronary artery disease    stents placed 2003  . Diabetes mellitus without complication (HCC)   . Hyperlipidemia   . Hypertension   . Sleep apnea    on CPAP    Past Surgical History:  Procedure Laterality Date  . BARIATRIC SURGERY  12/2015  . BICEPS TENDON REPAIR    . CARDIAC CATHETERIZATION    . KNEE ARTHROSCOPY Bilateral   . SHOULDER ARTHROSCOPY Bilateral   . TONSILLECTOMY    . UVULLA REMOVAL      Family History  Problem Relation Age of Onset  . Diabetes Mother   . Diabetes Father   . Heart attack Father      Social History Social History   Tobacco Use  . Smoking status: Never Smoker  . Smokeless tobacco: Never Used  Substance Use Topics  . Alcohol use: Not Currently    Alcohol/week: 0.0 oz  . Drug use: Never     No Known Allergies  Current Outpatient Medications  Medication Sig Dispense  Refill  . aspirin 81 MG tablet Take 81 mg by mouth daily.    Marland Kitchen b complex vitamins tablet Take 1 tablet by mouth daily.    . Calcium 500-100 MG-UNIT CHEW Chew 1 tablet by mouth 4 (four) times daily.    . ergocalciferol (VITAMIN D2) 50000 units capsule Take 50,000 Units by mouth once a week.    . metFORMIN (GLUCOPHAGE) 500 MG tablet Take 500 mg by mouth 3 (three) times daily.    . metoprolol succinate (TOPROL XL) 25 MG 24 hr tablet Take 1 tablet (25 mg total) by mouth daily. 90 tablet 3  . Multiple Vitamin (MULTIVITAMIN) tablet Take 1 tablet by mouth 2 (two) times daily.    . ramipril (ALTACE) 2.5 MG capsule Take 1 capsule (2.5 mg total) by mouth daily. 90 capsule 3  . rosuvastatin (CRESTOR) 40 MG tablet Take 1 tablet (40 mg total) by mouth daily. 90 tablet 3  . vitamin B-12 (CYANOCOBALAMIN) 1000 MCG tablet Take 1,000 mcg by mouth daily.     No current facility-administered medications for this visit.       REVIEW OF SYSTEMS (Negative unless checked)  Constitutional: [] Weight loss  [] Fever  [] Chills Cardiac: [] Chest pain   [] Chest pressure   [] Palpitations   [] Shortness of breath when  laying flat   [] Shortness of breath at rest   [] Shortness of breath with exertion. Vascular:  [] Pain in legs with walking   [] Pain in legs at rest   [] Pain in legs when laying flat   [] Claudication   [] Pain in feet when walking  [] Pain in feet at rest  [] Pain in feet when laying flat   [] History of DVT   [] Phlebitis   [x] Swelling in legs   [x] Varicose veins   [] Non-healing ulcers Pulmonary:   [] Uses home oxygen   [] Productive cough   [] Hemoptysis   [] Wheeze  [] COPD   [] Asthma Neurologic:  [] Dizziness  [] Blackouts   [] Seizures   [] History of stroke   [] History of TIA  [] Aphasia   [] Temporary blindness   [] Dysphagia   [] Weakness or numbness in arms   [] Weakness or numbness in legs Musculoskeletal:  [x] Arthritis   [] Joint swelling   [] Joint pain   [] Low back pain Hematologic:  [] Easy bruising  [] Easy bleeding    [] Hypercoagulable state   [] Anemic  [] Hepatitis Gastrointestinal:  [] Blood in stool   [] Vomiting blood  [x] Gastroesophageal reflux/heartburn   [] Abdominal pain Genitourinary:  [] Chronic kidney disease   [] Difficult urination  [] Frequent urination  [] Burning with urination   [] Hematuria Skin:  [] Rashes   [] Ulcers   [] Wounds Psychological:  [] History of anxiety   []  History of major depression.    Physical Exam BP 126/82 (BP Location: Right Arm)   Pulse 70   Resp 17   Ht 5' 11.5" (1.816 m)   Wt 239 lb 12.8 oz (108.8 kg)   BMI 32.98 kg/m  Gen:  WD/WN, NAD Head: [place]/AT, No temporalis wasting.  Ear/Nose/Throat: Hearing grossly intact, dentition good Eyes: Sclera non-icteric. Conjunctiva clear Neck: Supple. Trachea midline Pulmonary:  Good air movement, no use of accessory muscles, respirations not labored.  Cardiac: RRR, No JVD Vascular: Varicosities diffuse and measuring up to 2-3 mm in the right lower extremity        Varicosities scattered and measuring up to 2 mm in the left lower extremity Vessel Right Left  Radial Palpable Palpable                          PT Palpable Palpable  DP Palpable Palpable   Musculoskeletal: M/S 5/5 throughout.   No significant LE edema.  Moderate stasis dermatitis changes are present bilaterally Neurologic: Sensation grossly intact in extremities.  Symmetrical.  Speech is fluent.  Psychiatric: Judgment intact, Mood & affect appropriate for pt's clinical situation. Dermatologic: No rashes or ulcers noted.  No cellulitis or open wounds.    Radiology No results found.  Labs No results found for this or any previous visit (from the past 2160 hour(s)).  Assessment/Plan:  Hyperlipidemia lipid control important in reducing the progression of atherosclerotic disease. Continue statin therapy   Hypertension blood pressure control important in reducing the progression of atherosclerotic disease. On appropriate oral medications.   Varicose  veins of leg with pain, right The patient is undergone a right leg laser ablation about 6 or 7 months ago.  Although there is some partial recanalization of this, it is not large or particularly recanalized to consider a repeat laser ablation.  I do believe foam sclerotherapy to the tributaries of the great saphenous vein would be an excellent treatment option and he is already had that approved.  We will schedule this in the near future at his convenience.      Festus BarrenJason Nealy Hickmon 09/20/2017, 2:31 PM  This note was created with Dragon medical transcription system.  Any errors from dictation are unintentional.

## 2017-09-20 NOTE — Assessment & Plan Note (Signed)
blood pressure control important in reducing the progression of atherosclerotic disease. On appropriate oral medications.  

## 2017-09-23 ENCOUNTER — Telehealth (INDEPENDENT_AMBULATORY_CARE_PROVIDER_SITE_OTHER): Payer: Self-pay

## 2017-09-23 NOTE — Telephone Encounter (Signed)
Patient called to see if the paperwork or approval has been sent in for his foam sclerotherapy that he and Dr. Wyn Quakerew discussed on Friday?

## 2017-10-15 DIAGNOSIS — H43813 Vitreous degeneration, bilateral: Secondary | ICD-10-CM | POA: Diagnosis not present

## 2017-10-15 DIAGNOSIS — E113411 Type 2 diabetes mellitus with severe nonproliferative diabetic retinopathy with macular edema, right eye: Secondary | ICD-10-CM | POA: Diagnosis not present

## 2017-10-15 DIAGNOSIS — E113412 Type 2 diabetes mellitus with severe nonproliferative diabetic retinopathy with macular edema, left eye: Secondary | ICD-10-CM | POA: Diagnosis not present

## 2017-10-15 DIAGNOSIS — H3562 Retinal hemorrhage, left eye: Secondary | ICD-10-CM | POA: Diagnosis not present

## 2017-10-21 DIAGNOSIS — S233XXA Sprain of ligaments of thoracic spine, initial encounter: Secondary | ICD-10-CM | POA: Diagnosis not present

## 2017-10-21 DIAGNOSIS — S338XXA Sprain of other parts of lumbar spine and pelvis, initial encounter: Secondary | ICD-10-CM | POA: Diagnosis not present

## 2017-10-21 DIAGNOSIS — S134XXA Sprain of ligaments of cervical spine, initial encounter: Secondary | ICD-10-CM | POA: Diagnosis not present

## 2017-10-23 DIAGNOSIS — S338XXA Sprain of other parts of lumbar spine and pelvis, initial encounter: Secondary | ICD-10-CM | POA: Diagnosis not present

## 2017-10-23 DIAGNOSIS — S233XXA Sprain of ligaments of thoracic spine, initial encounter: Secondary | ICD-10-CM | POA: Diagnosis not present

## 2017-10-23 DIAGNOSIS — S134XXA Sprain of ligaments of cervical spine, initial encounter: Secondary | ICD-10-CM | POA: Diagnosis not present

## 2017-10-29 DIAGNOSIS — S134XXA Sprain of ligaments of cervical spine, initial encounter: Secondary | ICD-10-CM | POA: Diagnosis not present

## 2017-10-29 DIAGNOSIS — S338XXA Sprain of other parts of lumbar spine and pelvis, initial encounter: Secondary | ICD-10-CM | POA: Diagnosis not present

## 2017-10-29 DIAGNOSIS — S233XXA Sprain of ligaments of thoracic spine, initial encounter: Secondary | ICD-10-CM | POA: Diagnosis not present

## 2017-11-01 ENCOUNTER — Encounter (INDEPENDENT_AMBULATORY_CARE_PROVIDER_SITE_OTHER): Payer: Self-pay | Admitting: Vascular Surgery

## 2017-11-01 ENCOUNTER — Ambulatory Visit (INDEPENDENT_AMBULATORY_CARE_PROVIDER_SITE_OTHER): Payer: BLUE CROSS/BLUE SHIELD | Admitting: Vascular Surgery

## 2017-11-01 VITALS — BP 122/74 | HR 70 | Resp 13 | Ht 70.5 in | Wt 237.0 lb

## 2017-11-01 DIAGNOSIS — I83811 Varicose veins of right lower extremities with pain: Secondary | ICD-10-CM

## 2017-11-01 NOTE — Addendum Note (Signed)
Addended by: Annice NeedyEW, JASON S on: 11/01/2017 12:26 PM   Modules accepted: Level of Service

## 2017-11-01 NOTE — Progress Notes (Signed)
Levi KinsmanDanny F [name] is a 62 y.o.male who presents with painful varicose veins of the right leg  Past Medical History:  Diagnosis Date  . Coronary artery disease    stents placed 2003  . Diabetes mellitus without complication (HCC)   . Hyperlipidemia   . Hypertension   . Sleep apnea    on CPAP    Past Surgical History:  Procedure Laterality Date  . BARIATRIC SURGERY  12/2015  . BICEPS TENDON REPAIR    . CARDIAC CATHETERIZATION    . KNEE ARTHROSCOPY Bilateral   . SHOULDER ARTHROSCOPY Bilateral   . TONSILLECTOMY    . UVULLA REMOVAL      Current Outpatient Medications  Medication Sig Dispense Refill  . aspirin 81 MG tablet Take 81 mg by mouth daily.    Marland Kitchen. b complex vitamins tablet Take 1 tablet by mouth daily.    . Calcium 500-100 MG-UNIT CHEW Chew 1 tablet by mouth 4 (four) times daily.    . ergocalciferol (VITAMIN D2) 50000 units capsule Take 50,000 Units by mouth once a week.    Bess Harvest. Icosapent Ethyl 1 g CAPS Take 2 capsules twice a day for triglycerides    . metFORMIN (GLUCOPHAGE) 500 MG tablet Take 500 mg by mouth 3 (three) times daily.    . metoprolol succinate (TOPROL XL) 25 MG 24 hr tablet Take 1 tablet (25 mg total) by mouth daily. 90 tablet 3  . Multiple Vitamin (MULTIVITAMIN) tablet Take 1 tablet by mouth 2 (two) times daily.    . potassium chloride SA (K-DUR,KLOR-CON) 20 MEQ tablet Take by mouth.    . ramipril (ALTACE) 2.5 MG capsule Take 1 capsule (2.5 mg total) by mouth daily. 90 capsule 3  . rosuvastatin (CRESTOR) 40 MG tablet Take 1 tablet (40 mg total) by mouth daily. 90 tablet 3  . vitamin B-12 (CYANOCOBALAMIN) 1000 MCG tablet Take 1,000 mcg by mouth daily.     No current facility-administered medications for this visit.     No Known Allergies  Indication: Patient presents with symptomatic varicose veins of the right lower extremity.  Procedure: Foam sclerotherapy was performed on the right lower extremity. Using ultrasound guidance, 5 mL of foam Sotradecol was used  to inject the varicosities of the right lower extremity. Compression wraps were placed. The patient tolerated the procedure well.

## 2017-11-05 ENCOUNTER — Other Ambulatory Visit: Payer: Self-pay | Admitting: Adult Health

## 2017-11-05 DIAGNOSIS — S338XXA Sprain of other parts of lumbar spine and pelvis, initial encounter: Secondary | ICD-10-CM | POA: Diagnosis not present

## 2017-11-05 DIAGNOSIS — S233XXA Sprain of ligaments of thoracic spine, initial encounter: Secondary | ICD-10-CM | POA: Diagnosis not present

## 2017-11-05 DIAGNOSIS — S134XXA Sprain of ligaments of cervical spine, initial encounter: Secondary | ICD-10-CM | POA: Diagnosis not present

## 2017-11-12 DIAGNOSIS — S233XXA Sprain of ligaments of thoracic spine, initial encounter: Secondary | ICD-10-CM | POA: Diagnosis not present

## 2017-11-12 DIAGNOSIS — S134XXA Sprain of ligaments of cervical spine, initial encounter: Secondary | ICD-10-CM | POA: Diagnosis not present

## 2017-11-12 DIAGNOSIS — S338XXA Sprain of other parts of lumbar spine and pelvis, initial encounter: Secondary | ICD-10-CM | POA: Diagnosis not present

## 2017-11-15 DIAGNOSIS — E1142 Type 2 diabetes mellitus with diabetic polyneuropathy: Secondary | ICD-10-CM | POA: Diagnosis not present

## 2017-11-15 DIAGNOSIS — E782 Mixed hyperlipidemia: Secondary | ICD-10-CM | POA: Diagnosis not present

## 2017-11-18 DIAGNOSIS — S233XXA Sprain of ligaments of thoracic spine, initial encounter: Secondary | ICD-10-CM | POA: Diagnosis not present

## 2017-11-18 DIAGNOSIS — S338XXA Sprain of other parts of lumbar spine and pelvis, initial encounter: Secondary | ICD-10-CM | POA: Diagnosis not present

## 2017-11-18 DIAGNOSIS — S134XXA Sprain of ligaments of cervical spine, initial encounter: Secondary | ICD-10-CM | POA: Diagnosis not present

## 2017-11-18 DIAGNOSIS — G4733 Obstructive sleep apnea (adult) (pediatric): Secondary | ICD-10-CM | POA: Diagnosis not present

## 2017-11-19 DIAGNOSIS — E113313 Type 2 diabetes mellitus with moderate nonproliferative diabetic retinopathy with macular edema, bilateral: Secondary | ICD-10-CM | POA: Diagnosis not present

## 2017-11-19 DIAGNOSIS — E1159 Type 2 diabetes mellitus with other circulatory complications: Secondary | ICD-10-CM | POA: Diagnosis not present

## 2017-11-19 DIAGNOSIS — Z9884 Bariatric surgery status: Secondary | ICD-10-CM | POA: Diagnosis not present

## 2017-11-19 DIAGNOSIS — E1142 Type 2 diabetes mellitus with diabetic polyneuropathy: Secondary | ICD-10-CM | POA: Diagnosis not present

## 2017-11-20 DIAGNOSIS — E113411 Type 2 diabetes mellitus with severe nonproliferative diabetic retinopathy with macular edema, right eye: Secondary | ICD-10-CM | POA: Diagnosis not present

## 2017-11-20 DIAGNOSIS — E113412 Type 2 diabetes mellitus with severe nonproliferative diabetic retinopathy with macular edema, left eye: Secondary | ICD-10-CM | POA: Diagnosis not present

## 2017-11-20 DIAGNOSIS — H43813 Vitreous degeneration, bilateral: Secondary | ICD-10-CM | POA: Diagnosis not present

## 2017-11-22 ENCOUNTER — Encounter (INDEPENDENT_AMBULATORY_CARE_PROVIDER_SITE_OTHER): Payer: Self-pay | Admitting: Vascular Surgery

## 2017-11-22 ENCOUNTER — Ambulatory Visit (INDEPENDENT_AMBULATORY_CARE_PROVIDER_SITE_OTHER): Payer: BLUE CROSS/BLUE SHIELD | Admitting: Vascular Surgery

## 2017-11-22 VITALS — BP 129/72 | HR 75 | Resp 16 | Ht 70.5 in | Wt 232.0 lb

## 2017-11-22 DIAGNOSIS — I83811 Varicose veins of right lower extremities with pain: Secondary | ICD-10-CM | POA: Diagnosis not present

## 2017-11-22 DIAGNOSIS — Z23 Encounter for immunization: Secondary | ICD-10-CM | POA: Diagnosis not present

## 2017-11-22 NOTE — Progress Notes (Signed)
Lester KinsmanDanny F Barnard is a 62 y.o.male who presents with painful varicose veins of the right leg  Past Medical History:  Diagnosis Date  . Coronary artery disease    stents placed 2003  . Diabetes mellitus without complication (HCC)   . Hyperlipidemia   . Hypertension   . Sleep apnea    on CPAP    Past Surgical History:  Procedure Laterality Date  . BARIATRIC SURGERY  12/2015  . BICEPS TENDON REPAIR    . CARDIAC CATHETERIZATION    . KNEE ARTHROSCOPY Bilateral   . SHOULDER ARTHROSCOPY Bilateral   . TONSILLECTOMY    . UVULLA REMOVAL      Current Outpatient Medications  Medication Sig Dispense Refill  . aspirin 81 MG tablet Take 81 mg by mouth daily.    Marland Kitchen. b complex vitamins tablet Take 1 tablet by mouth daily.    . Calcium 500-100 MG-UNIT CHEW Chew 1 tablet by mouth 4 (four) times daily.    . ergocalciferol (VITAMIN D2) 50000 units capsule Take 50,000 Units by mouth once a week.    Bess Harvest. Icosapent Ethyl 1 g CAPS Take 2 capsules twice a day for triglycerides    . metFORMIN (GLUCOPHAGE) 500 MG tablet Take 500 mg by mouth 3 (three) times daily.    . metoprolol succinate (TOPROL-XL) 25 MG 24 hr tablet TAKE 1 TABLET BY MOUTH  DAILY 90 tablet 3  . Multiple Vitamin (MULTIVITAMIN) tablet Take 1 tablet by mouth 2 (two) times daily.    . potassium chloride SA (K-DUR,KLOR-CON) 20 MEQ tablet Take by mouth.    . ramipril (ALTACE) 2.5 MG capsule Take 1 capsule (2.5 mg total) by mouth daily. 90 capsule 3  . rosuvastatin (CRESTOR) 40 MG tablet TAKE 1 TABLET BY MOUTH  DAILY 90 tablet 3  . vitamin B-12 (CYANOCOBALAMIN) 1000 MCG tablet Take 1,000 mcg by mouth daily.     No current facility-administered medications for this visit.     No Known Allergies  Indication: Patient presents with symptomatic varicose veins of the right lower extremity.  Procedure: Foam sclerotherapy was performed on the right lower extremity. Using ultrasound guidance, 5 mL of foam Sotradecol was used to inject the varicosities of  the right lower extremity. Compression wraps were placed. The patient tolerated the procedure well.

## 2017-11-24 ENCOUNTER — Other Ambulatory Visit: Payer: Self-pay | Admitting: Adult Health

## 2017-11-25 DIAGNOSIS — S233XXA Sprain of ligaments of thoracic spine, initial encounter: Secondary | ICD-10-CM | POA: Diagnosis not present

## 2017-11-25 DIAGNOSIS — S338XXA Sprain of other parts of lumbar spine and pelvis, initial encounter: Secondary | ICD-10-CM | POA: Diagnosis not present

## 2017-11-25 DIAGNOSIS — S134XXA Sprain of ligaments of cervical spine, initial encounter: Secondary | ICD-10-CM | POA: Diagnosis not present

## 2017-12-02 DIAGNOSIS — S338XXA Sprain of other parts of lumbar spine and pelvis, initial encounter: Secondary | ICD-10-CM | POA: Diagnosis not present

## 2017-12-02 DIAGNOSIS — S233XXA Sprain of ligaments of thoracic spine, initial encounter: Secondary | ICD-10-CM | POA: Diagnosis not present

## 2017-12-02 DIAGNOSIS — S134XXA Sprain of ligaments of cervical spine, initial encounter: Secondary | ICD-10-CM | POA: Diagnosis not present

## 2017-12-09 DIAGNOSIS — S338XXA Sprain of other parts of lumbar spine and pelvis, initial encounter: Secondary | ICD-10-CM | POA: Diagnosis not present

## 2017-12-09 DIAGNOSIS — S134XXA Sprain of ligaments of cervical spine, initial encounter: Secondary | ICD-10-CM | POA: Diagnosis not present

## 2017-12-09 DIAGNOSIS — S233XXA Sprain of ligaments of thoracic spine, initial encounter: Secondary | ICD-10-CM | POA: Diagnosis not present

## 2017-12-16 DIAGNOSIS — S338XXA Sprain of other parts of lumbar spine and pelvis, initial encounter: Secondary | ICD-10-CM | POA: Diagnosis not present

## 2017-12-16 DIAGNOSIS — S233XXA Sprain of ligaments of thoracic spine, initial encounter: Secondary | ICD-10-CM | POA: Diagnosis not present

## 2017-12-16 DIAGNOSIS — S134XXA Sprain of ligaments of cervical spine, initial encounter: Secondary | ICD-10-CM | POA: Diagnosis not present

## 2017-12-18 DIAGNOSIS — Z9884 Bariatric surgery status: Secondary | ICD-10-CM | POA: Diagnosis not present

## 2017-12-18 DIAGNOSIS — I10 Essential (primary) hypertension: Secondary | ICD-10-CM | POA: Diagnosis not present

## 2017-12-18 DIAGNOSIS — R252 Cramp and spasm: Secondary | ICD-10-CM | POA: Diagnosis not present

## 2017-12-18 DIAGNOSIS — E785 Hyperlipidemia, unspecified: Secondary | ICD-10-CM | POA: Diagnosis not present

## 2017-12-20 DIAGNOSIS — S338XXA Sprain of other parts of lumbar spine and pelvis, initial encounter: Secondary | ICD-10-CM | POA: Diagnosis not present

## 2017-12-20 DIAGNOSIS — S134XXA Sprain of ligaments of cervical spine, initial encounter: Secondary | ICD-10-CM | POA: Diagnosis not present

## 2017-12-20 DIAGNOSIS — S233XXA Sprain of ligaments of thoracic spine, initial encounter: Secondary | ICD-10-CM | POA: Diagnosis not present

## 2017-12-25 DIAGNOSIS — E113412 Type 2 diabetes mellitus with severe nonproliferative diabetic retinopathy with macular edema, left eye: Secondary | ICD-10-CM | POA: Diagnosis not present

## 2017-12-25 DIAGNOSIS — E113411 Type 2 diabetes mellitus with severe nonproliferative diabetic retinopathy with macular edema, right eye: Secondary | ICD-10-CM | POA: Diagnosis not present

## 2017-12-25 DIAGNOSIS — H3561 Retinal hemorrhage, right eye: Secondary | ICD-10-CM | POA: Diagnosis not present

## 2017-12-26 DIAGNOSIS — S338XXA Sprain of other parts of lumbar spine and pelvis, initial encounter: Secondary | ICD-10-CM | POA: Diagnosis not present

## 2017-12-26 DIAGNOSIS — S233XXA Sprain of ligaments of thoracic spine, initial encounter: Secondary | ICD-10-CM | POA: Diagnosis not present

## 2017-12-26 DIAGNOSIS — S134XXA Sprain of ligaments of cervical spine, initial encounter: Secondary | ICD-10-CM | POA: Diagnosis not present

## 2017-12-30 DIAGNOSIS — S338XXA Sprain of other parts of lumbar spine and pelvis, initial encounter: Secondary | ICD-10-CM | POA: Diagnosis not present

## 2017-12-30 DIAGNOSIS — S233XXA Sprain of ligaments of thoracic spine, initial encounter: Secondary | ICD-10-CM | POA: Diagnosis not present

## 2017-12-30 DIAGNOSIS — S134XXA Sprain of ligaments of cervical spine, initial encounter: Secondary | ICD-10-CM | POA: Diagnosis not present

## 2018-01-02 DIAGNOSIS — S134XXA Sprain of ligaments of cervical spine, initial encounter: Secondary | ICD-10-CM | POA: Diagnosis not present

## 2018-01-02 DIAGNOSIS — S338XXA Sprain of other parts of lumbar spine and pelvis, initial encounter: Secondary | ICD-10-CM | POA: Diagnosis not present

## 2018-01-02 DIAGNOSIS — S233XXA Sprain of ligaments of thoracic spine, initial encounter: Secondary | ICD-10-CM | POA: Diagnosis not present

## 2018-01-06 DIAGNOSIS — S338XXA Sprain of other parts of lumbar spine and pelvis, initial encounter: Secondary | ICD-10-CM | POA: Diagnosis not present

## 2018-01-06 DIAGNOSIS — S233XXA Sprain of ligaments of thoracic spine, initial encounter: Secondary | ICD-10-CM | POA: Diagnosis not present

## 2018-01-06 DIAGNOSIS — S134XXA Sprain of ligaments of cervical spine, initial encounter: Secondary | ICD-10-CM | POA: Diagnosis not present

## 2018-01-09 DIAGNOSIS — S338XXA Sprain of other parts of lumbar spine and pelvis, initial encounter: Secondary | ICD-10-CM | POA: Diagnosis not present

## 2018-01-09 DIAGNOSIS — S233XXA Sprain of ligaments of thoracic spine, initial encounter: Secondary | ICD-10-CM | POA: Diagnosis not present

## 2018-01-09 DIAGNOSIS — S134XXA Sprain of ligaments of cervical spine, initial encounter: Secondary | ICD-10-CM | POA: Diagnosis not present

## 2018-01-16 DIAGNOSIS — S233XXA Sprain of ligaments of thoracic spine, initial encounter: Secondary | ICD-10-CM | POA: Diagnosis not present

## 2018-01-16 DIAGNOSIS — S338XXA Sprain of other parts of lumbar spine and pelvis, initial encounter: Secondary | ICD-10-CM | POA: Diagnosis not present

## 2018-01-16 DIAGNOSIS — S134XXA Sprain of ligaments of cervical spine, initial encounter: Secondary | ICD-10-CM | POA: Diagnosis not present

## 2018-01-23 DIAGNOSIS — S134XXA Sprain of ligaments of cervical spine, initial encounter: Secondary | ICD-10-CM | POA: Diagnosis not present

## 2018-01-23 DIAGNOSIS — S233XXA Sprain of ligaments of thoracic spine, initial encounter: Secondary | ICD-10-CM | POA: Diagnosis not present

## 2018-01-23 DIAGNOSIS — S338XXA Sprain of other parts of lumbar spine and pelvis, initial encounter: Secondary | ICD-10-CM | POA: Diagnosis not present

## 2018-01-30 DIAGNOSIS — S134XXA Sprain of ligaments of cervical spine, initial encounter: Secondary | ICD-10-CM | POA: Diagnosis not present

## 2018-01-30 DIAGNOSIS — S233XXA Sprain of ligaments of thoracic spine, initial encounter: Secondary | ICD-10-CM | POA: Diagnosis not present

## 2018-01-30 DIAGNOSIS — S338XXA Sprain of other parts of lumbar spine and pelvis, initial encounter: Secondary | ICD-10-CM | POA: Diagnosis not present

## 2018-02-04 DIAGNOSIS — S233XXA Sprain of ligaments of thoracic spine, initial encounter: Secondary | ICD-10-CM | POA: Diagnosis not present

## 2018-02-04 DIAGNOSIS — S134XXA Sprain of ligaments of cervical spine, initial encounter: Secondary | ICD-10-CM | POA: Diagnosis not present

## 2018-02-04 DIAGNOSIS — S338XXA Sprain of other parts of lumbar spine and pelvis, initial encounter: Secondary | ICD-10-CM | POA: Diagnosis not present

## 2018-02-11 DIAGNOSIS — H43813 Vitreous degeneration, bilateral: Secondary | ICD-10-CM | POA: Diagnosis not present

## 2018-02-11 DIAGNOSIS — E113411 Type 2 diabetes mellitus with severe nonproliferative diabetic retinopathy with macular edema, right eye: Secondary | ICD-10-CM | POA: Diagnosis not present

## 2018-02-12 DIAGNOSIS — S134XXA Sprain of ligaments of cervical spine, initial encounter: Secondary | ICD-10-CM | POA: Diagnosis not present

## 2018-02-12 DIAGNOSIS — S338XXA Sprain of other parts of lumbar spine and pelvis, initial encounter: Secondary | ICD-10-CM | POA: Diagnosis not present

## 2018-02-12 DIAGNOSIS — S233XXA Sprain of ligaments of thoracic spine, initial encounter: Secondary | ICD-10-CM | POA: Diagnosis not present

## 2018-02-14 ENCOUNTER — Encounter: Payer: Self-pay | Admitting: Cardiology

## 2018-02-14 ENCOUNTER — Ambulatory Visit (INDEPENDENT_AMBULATORY_CARE_PROVIDER_SITE_OTHER): Payer: BLUE CROSS/BLUE SHIELD | Admitting: Cardiology

## 2018-02-14 VITALS — BP 118/64 | HR 70 | Ht 71.0 in | Wt 226.0 lb

## 2018-02-14 DIAGNOSIS — I10 Essential (primary) hypertension: Secondary | ICD-10-CM | POA: Diagnosis not present

## 2018-02-14 DIAGNOSIS — I2510 Atherosclerotic heart disease of native coronary artery without angina pectoris: Secondary | ICD-10-CM | POA: Diagnosis not present

## 2018-02-14 DIAGNOSIS — E782 Mixed hyperlipidemia: Secondary | ICD-10-CM

## 2018-02-14 DIAGNOSIS — E119 Type 2 diabetes mellitus without complications: Secondary | ICD-10-CM

## 2018-02-14 NOTE — Progress Notes (Signed)
Clinical Summary [name] is a 62 y.o.male seen today for f/u of the following medical problems.   1. Obesity - s/p bariatric surgery 12/2015. Prior to surgery 310 lbs.     2. CAD - history of prior stent to LAD, with repeat balloon angioplsty of LAD stent in 2005 - last cath 2008 with patent stent and vessels.  - 07/2015 echo LVEF 60-65% and now WMAs. 08/2015 nuclear stress test without ischemia.   - no recent chest pain. No SOB or DOE - compliant with meds   3. HTN - compliant with meds  4. Hyperlipidemia 02/2018 TC 85 TG 168 HDL 37 LDL 30 - compliant with statin  5. 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    Past Medical History:  Diagnosis Date  . Coronary artery disease    stents placed 2003  . Diabetes mellitus without complication (HCC)   . Hyperlipidemia   . Hypertension   . Sleep apnea    on CPAP     No Known Allergies   Current Outpatient Medications  Medication Sig Dispense Refill  . aspirin 81 MG tablet Take 81 mg by mouth daily.    Marland Kitchen b complex vitamins tablet Take 1 tablet by mouth daily.    . Calcium 500-100 MG-UNIT CHEW Chew 1 tablet by mouth 4 (four) times daily.    . ergocalciferol (VITAMIN D2) 50000 units capsule Take 50,000 Units by mouth once a week.    Bess Harvest Ethyl 1 g CAPS Take 2 capsules twice a day for triglycerides    . metFORMIN (GLUCOPHAGE) 500 MG tablet Take 500 mg by mouth 3 (three) times daily.    . metoprolol succinate (TOPROL-XL) 25 MG 24 hr tablet TAKE 1 TABLET BY MOUTH  DAILY 90 tablet 3  . Multiple Vitamin (MULTIVITAMIN) tablet Take 1 tablet by mouth 2 (two) times daily.    . potassium chloride SA (K-DUR,KLOR-CON) 20 MEQ tablet Take by mouth.    . ramipril (ALTACE) 2.5 MG capsule TAKE 1 CAPSULE BY MOUTH  DAILY 90 capsule 3  . rosuvastatin (CRESTOR) 40 MG tablet TAKE 1 TABLET BY MOUTH  DAILY 90 tablet 3  . vitamin B-12  (CYANOCOBALAMIN) 1000 MCG tablet Take 1,000 mcg by mouth daily.     No current facility-administered medications for this visit.      Past Surgical History:  Procedure Laterality Date  . BARIATRIC SURGERY  12/2015  . BICEPS TENDON REPAIR    . CARDIAC CATHETERIZATION    . KNEE ARTHROSCOPY Bilateral   . SHOULDER ARTHROSCOPY Bilateral   . TONSILLECTOMY    . UVULLA REMOVAL       No Known Allergies    Family History  Problem Relation Age of Onset  . Diabetes Mother   . Diabetes Father   . Heart attack Father      Social History Mr. Belfield reports that he has never smoked. He has never used smokeless tobacco. Mr. Pischke reports that he drank alcohol.   Review of Systems CONSTITUTIONAL: No weight loss, fever, chills, weakness or fatigue.  HEENT: Eyes: No visual loss, blurred vision, double vision or yellow sclerae.No hearing loss, sneezing, congestion, runny nose or sore throat.  SKIN: No rash or itching.  CARDIOVASCULAR: per hpi RESPIRATORY: No shortness of breath, cough or sputum.  GASTROINTESTINAL: No anorexia, nausea, vomiting or diarrhea. No abdominal pain or blood.  GENITOURINARY: No burning  on urination, no polyuria NEUROLOGICAL: No headache, dizziness, syncope, paralysis, ataxia, numbness or tingling in the extremities. No change in bowel or bladder control.  MUSCULOSKELETAL: No muscle, back pain, joint pain or stiffness.  LYMPHATICS: No enlarged nodes. No history of splenectomy.  PSYCHIATRIC: No history of depression or anxiety.  ENDOCRINOLOGIC: No reports of sweating, cold or heat intolerance. No polyuria or polydipsia.  Marland Kitchen.   Physical Examination Vitals:   02/14/18 0934  BP: 118/64  Pulse: 70  SpO2: 98%   Vitals:   02/14/18 0934  Weight: 226 lb (102.5 kg)  Height: 5\' 11"  (1.803 m)    Gen: resting comfortably, no acute distress HEENT: no scleral icterus, pupils equal round and reactive, no palptable cervical adenopathy,  CV: RRR, no m/r/g, no  jvd Resp: Clear to auscultation bilaterally GI: abdomen is soft, non-tender, non-distended, normal bowel sounds, no hepatosplenomegaly MSK: extremities are warm, no edema.  Skin: warm, no rash Neuro:  no focal deficits Psych: appropriate affect   Diagnostic Studies 02/2007 cath CONCLUSION: 1. Preserved left ventricular function with ejection fraction in  excess of 60%. 2. Continued patency of the stent site at the site of subsequent  cutting balloon angioplasty. 3. Scattered coronary irregularities as noted above. 4. Probable poor control of diabetes mellitus.  RECOMMENDATIONS: We will likely recommend medical therapy. We will keep him overnight and recheck his enzymes.   07/2015 echo Study Conclusions  - Left ventricle: The cavity size was normal. Wall thickness was increased in a pattern of moderate LVH. Systolic function was normal. The estimated ejection fraction was in the range of 60% to 65%. Wall motion was normal; there were no regional wall motion abnormalities. Left ventricular diastolic function parameters were normal. - Aortic valve: Valve area (VTI): 4.3 cm^2. Valve area (Vmax): 4.12 cm^2. Valve area (Vmean): 3.19 cm^2. - Technically adequate study.  08/2015 Nuclear stress test  Blood pressure demonstrated a hypertensive response to exercise.  There was no ST segment deviation noted during stress.  Defect 1: There is a small defect of mild severity present in the mid inferior, mid inferolateral and apical inferior location. This is consistent with soft tissue attenuation artifact. No areas of ischemia or infarction.  This is a low risk study.  Nuclear stress EF: 68%.    Assessment and Plan  1. CAD -doing well without symptoms, continue current meds and secondary prevention - EKG shows SR, no ischemic changes  2. HTN - at goal, continue current meds   3. Hyperlipidemia - LDL at goal, contiue high intensity statin  in setting of CAD.    4. DM2 - from cardiovascular standpoint he is on ACEI and statin      Levi PocheJonathan F. Caylea [name], M.D.

## 2018-02-14 NOTE — Patient Instructions (Signed)

## 2018-02-18 DIAGNOSIS — E113313 Type 2 diabetes mellitus with moderate nonproliferative diabetic retinopathy with macular edema, bilateral: Secondary | ICD-10-CM | POA: Diagnosis not present

## 2018-02-18 DIAGNOSIS — E113411 Type 2 diabetes mellitus with severe nonproliferative diabetic retinopathy with macular edema, right eye: Secondary | ICD-10-CM | POA: Diagnosis not present

## 2018-02-18 DIAGNOSIS — E1142 Type 2 diabetes mellitus with diabetic polyneuropathy: Secondary | ICD-10-CM | POA: Diagnosis not present

## 2018-02-18 DIAGNOSIS — E1159 Type 2 diabetes mellitus with other circulatory complications: Secondary | ICD-10-CM | POA: Diagnosis not present

## 2018-02-18 DIAGNOSIS — Z9884 Bariatric surgery status: Secondary | ICD-10-CM | POA: Diagnosis not present

## 2018-02-19 DIAGNOSIS — S233XXA Sprain of ligaments of thoracic spine, initial encounter: Secondary | ICD-10-CM | POA: Diagnosis not present

## 2018-02-19 DIAGNOSIS — S134XXA Sprain of ligaments of cervical spine, initial encounter: Secondary | ICD-10-CM | POA: Diagnosis not present

## 2018-02-19 DIAGNOSIS — S338XXA Sprain of other parts of lumbar spine and pelvis, initial encounter: Secondary | ICD-10-CM | POA: Diagnosis not present

## 2018-02-26 DIAGNOSIS — S338XXA Sprain of other parts of lumbar spine and pelvis, initial encounter: Secondary | ICD-10-CM | POA: Diagnosis not present

## 2018-02-26 DIAGNOSIS — S134XXA Sprain of ligaments of cervical spine, initial encounter: Secondary | ICD-10-CM | POA: Diagnosis not present

## 2018-02-26 DIAGNOSIS — S233XXA Sprain of ligaments of thoracic spine, initial encounter: Secondary | ICD-10-CM | POA: Diagnosis not present

## 2018-03-13 DIAGNOSIS — S233XXA Sprain of ligaments of thoracic spine, initial encounter: Secondary | ICD-10-CM | POA: Diagnosis not present

## 2018-03-13 DIAGNOSIS — S134XXA Sprain of ligaments of cervical spine, initial encounter: Secondary | ICD-10-CM | POA: Diagnosis not present

## 2018-03-13 DIAGNOSIS — S338XXA Sprain of other parts of lumbar spine and pelvis, initial encounter: Secondary | ICD-10-CM | POA: Diagnosis not present

## 2018-03-19 DIAGNOSIS — Z23 Encounter for immunization: Secondary | ICD-10-CM | POA: Diagnosis not present

## 2018-03-19 DIAGNOSIS — Z0001 Encounter for general adult medical examination with abnormal findings: Secondary | ICD-10-CM | POA: Diagnosis not present

## 2018-03-19 DIAGNOSIS — Z6831 Body mass index (BMI) 31.0-31.9, adult: Secondary | ICD-10-CM | POA: Diagnosis not present

## 2018-03-27 DIAGNOSIS — S134XXA Sprain of ligaments of cervical spine, initial encounter: Secondary | ICD-10-CM | POA: Diagnosis not present

## 2018-03-27 DIAGNOSIS — S338XXA Sprain of other parts of lumbar spine and pelvis, initial encounter: Secondary | ICD-10-CM | POA: Diagnosis not present

## 2018-03-27 DIAGNOSIS — S233XXA Sprain of ligaments of thoracic spine, initial encounter: Secondary | ICD-10-CM | POA: Diagnosis not present

## 2018-04-01 DIAGNOSIS — E113411 Type 2 diabetes mellitus with severe nonproliferative diabetic retinopathy with macular edema, right eye: Secondary | ICD-10-CM | POA: Diagnosis not present

## 2018-04-01 DIAGNOSIS — E113412 Type 2 diabetes mellitus with severe nonproliferative diabetic retinopathy with macular edema, left eye: Secondary | ICD-10-CM | POA: Diagnosis not present

## 2018-04-10 DIAGNOSIS — S233XXA Sprain of ligaments of thoracic spine, initial encounter: Secondary | ICD-10-CM | POA: Diagnosis not present

## 2018-04-10 DIAGNOSIS — S134XXA Sprain of ligaments of cervical spine, initial encounter: Secondary | ICD-10-CM | POA: Diagnosis not present

## 2018-04-10 DIAGNOSIS — S338XXA Sprain of other parts of lumbar spine and pelvis, initial encounter: Secondary | ICD-10-CM | POA: Diagnosis not present

## 2018-04-24 DIAGNOSIS — S233XXA Sprain of ligaments of thoracic spine, initial encounter: Secondary | ICD-10-CM | POA: Diagnosis not present

## 2018-04-24 DIAGNOSIS — S134XXA Sprain of ligaments of cervical spine, initial encounter: Secondary | ICD-10-CM | POA: Diagnosis not present

## 2018-04-24 DIAGNOSIS — S338XXA Sprain of other parts of lumbar spine and pelvis, initial encounter: Secondary | ICD-10-CM | POA: Diagnosis not present

## 2018-05-08 DIAGNOSIS — S233XXA Sprain of ligaments of thoracic spine, initial encounter: Secondary | ICD-10-CM | POA: Diagnosis not present

## 2018-05-08 DIAGNOSIS — S338XXA Sprain of other parts of lumbar spine and pelvis, initial encounter: Secondary | ICD-10-CM | POA: Diagnosis not present

## 2018-05-08 DIAGNOSIS — S134XXA Sprain of ligaments of cervical spine, initial encounter: Secondary | ICD-10-CM | POA: Diagnosis not present

## 2018-05-21 DIAGNOSIS — Z9884 Bariatric surgery status: Secondary | ICD-10-CM | POA: Diagnosis not present

## 2018-05-21 DIAGNOSIS — E1142 Type 2 diabetes mellitus with diabetic polyneuropathy: Secondary | ICD-10-CM | POA: Diagnosis not present

## 2018-05-21 DIAGNOSIS — E782 Mixed hyperlipidemia: Secondary | ICD-10-CM | POA: Diagnosis not present

## 2018-05-21 DIAGNOSIS — E538 Deficiency of other specified B group vitamins: Secondary | ICD-10-CM | POA: Diagnosis not present

## 2018-05-22 DIAGNOSIS — S134XXA Sprain of ligaments of cervical spine, initial encounter: Secondary | ICD-10-CM | POA: Diagnosis not present

## 2018-05-22 DIAGNOSIS — S233XXA Sprain of ligaments of thoracic spine, initial encounter: Secondary | ICD-10-CM | POA: Diagnosis not present

## 2018-05-22 DIAGNOSIS — S338XXA Sprain of other parts of lumbar spine and pelvis, initial encounter: Secondary | ICD-10-CM | POA: Diagnosis not present

## 2018-05-27 DIAGNOSIS — G4733 Obstructive sleep apnea (adult) (pediatric): Secondary | ICD-10-CM | POA: Diagnosis not present

## 2018-05-29 DIAGNOSIS — E113412 Type 2 diabetes mellitus with severe nonproliferative diabetic retinopathy with macular edema, left eye: Secondary | ICD-10-CM | POA: Diagnosis not present

## 2018-05-29 DIAGNOSIS — E113411 Type 2 diabetes mellitus with severe nonproliferative diabetic retinopathy with macular edema, right eye: Secondary | ICD-10-CM | POA: Diagnosis not present

## 2018-05-29 DIAGNOSIS — H3562 Retinal hemorrhage, left eye: Secondary | ICD-10-CM | POA: Diagnosis not present

## 2018-05-29 DIAGNOSIS — H43813 Vitreous degeneration, bilateral: Secondary | ICD-10-CM | POA: Diagnosis not present

## 2018-06-05 DIAGNOSIS — S233XXA Sprain of ligaments of thoracic spine, initial encounter: Secondary | ICD-10-CM | POA: Diagnosis not present

## 2018-06-05 DIAGNOSIS — S134XXA Sprain of ligaments of cervical spine, initial encounter: Secondary | ICD-10-CM | POA: Diagnosis not present

## 2018-06-05 DIAGNOSIS — S338XXA Sprain of other parts of lumbar spine and pelvis, initial encounter: Secondary | ICD-10-CM | POA: Diagnosis not present

## 2018-06-06 DIAGNOSIS — G4733 Obstructive sleep apnea (adult) (pediatric): Secondary | ICD-10-CM | POA: Diagnosis not present

## 2018-06-19 DIAGNOSIS — S233XXA Sprain of ligaments of thoracic spine, initial encounter: Secondary | ICD-10-CM | POA: Diagnosis not present

## 2018-06-19 DIAGNOSIS — S338XXA Sprain of other parts of lumbar spine and pelvis, initial encounter: Secondary | ICD-10-CM | POA: Diagnosis not present

## 2018-06-19 DIAGNOSIS — S134XXA Sprain of ligaments of cervical spine, initial encounter: Secondary | ICD-10-CM | POA: Diagnosis not present

## 2018-07-03 DIAGNOSIS — S134XXA Sprain of ligaments of cervical spine, initial encounter: Secondary | ICD-10-CM | POA: Diagnosis not present

## 2018-07-03 DIAGNOSIS — S233XXA Sprain of ligaments of thoracic spine, initial encounter: Secondary | ICD-10-CM | POA: Diagnosis not present

## 2018-07-03 DIAGNOSIS — S338XXA Sprain of other parts of lumbar spine and pelvis, initial encounter: Secondary | ICD-10-CM | POA: Diagnosis not present

## 2018-07-17 DIAGNOSIS — S233XXA Sprain of ligaments of thoracic spine, initial encounter: Secondary | ICD-10-CM | POA: Diagnosis not present

## 2018-07-17 DIAGNOSIS — S134XXA Sprain of ligaments of cervical spine, initial encounter: Secondary | ICD-10-CM | POA: Diagnosis not present

## 2018-07-17 DIAGNOSIS — S338XXA Sprain of other parts of lumbar spine and pelvis, initial encounter: Secondary | ICD-10-CM | POA: Diagnosis not present

## 2018-07-31 DIAGNOSIS — S338XXA Sprain of other parts of lumbar spine and pelvis, initial encounter: Secondary | ICD-10-CM | POA: Diagnosis not present

## 2018-07-31 DIAGNOSIS — S134XXA Sprain of ligaments of cervical spine, initial encounter: Secondary | ICD-10-CM | POA: Diagnosis not present

## 2018-07-31 DIAGNOSIS — S233XXA Sprain of ligaments of thoracic spine, initial encounter: Secondary | ICD-10-CM | POA: Diagnosis not present

## 2018-08-14 DIAGNOSIS — S233XXA Sprain of ligaments of thoracic spine, initial encounter: Secondary | ICD-10-CM | POA: Diagnosis not present

## 2018-08-14 DIAGNOSIS — S338XXA Sprain of other parts of lumbar spine and pelvis, initial encounter: Secondary | ICD-10-CM | POA: Diagnosis not present

## 2018-08-14 DIAGNOSIS — S134XXA Sprain of ligaments of cervical spine, initial encounter: Secondary | ICD-10-CM | POA: Diagnosis not present

## 2018-08-27 DIAGNOSIS — E113411 Type 2 diabetes mellitus with severe nonproliferative diabetic retinopathy with macular edema, right eye: Secondary | ICD-10-CM | POA: Diagnosis not present

## 2018-08-27 DIAGNOSIS — H3562 Retinal hemorrhage, left eye: Secondary | ICD-10-CM | POA: Diagnosis not present

## 2018-08-27 DIAGNOSIS — E113412 Type 2 diabetes mellitus with severe nonproliferative diabetic retinopathy with macular edema, left eye: Secondary | ICD-10-CM | POA: Diagnosis not present

## 2018-08-27 DIAGNOSIS — H43813 Vitreous degeneration, bilateral: Secondary | ICD-10-CM | POA: Diagnosis not present

## 2018-08-28 DIAGNOSIS — S134XXA Sprain of ligaments of cervical spine, initial encounter: Secondary | ICD-10-CM | POA: Diagnosis not present

## 2018-08-28 DIAGNOSIS — S233XXA Sprain of ligaments of thoracic spine, initial encounter: Secondary | ICD-10-CM | POA: Diagnosis not present

## 2018-08-28 DIAGNOSIS — S338XXA Sprain of other parts of lumbar spine and pelvis, initial encounter: Secondary | ICD-10-CM | POA: Diagnosis not present

## 2018-09-02 DIAGNOSIS — E113411 Type 2 diabetes mellitus with severe nonproliferative diabetic retinopathy with macular edema, right eye: Secondary | ICD-10-CM | POA: Diagnosis not present

## 2018-09-11 DIAGNOSIS — K760 Fatty (change of) liver, not elsewhere classified: Secondary | ICD-10-CM | POA: Diagnosis not present

## 2018-09-11 DIAGNOSIS — E1143 Type 2 diabetes mellitus with diabetic autonomic (poly)neuropathy: Secondary | ICD-10-CM | POA: Diagnosis not present

## 2018-09-11 DIAGNOSIS — E782 Mixed hyperlipidemia: Secondary | ICD-10-CM | POA: Diagnosis not present

## 2018-09-11 DIAGNOSIS — E113313 Type 2 diabetes mellitus with moderate nonproliferative diabetic retinopathy with macular edema, bilateral: Secondary | ICD-10-CM | POA: Diagnosis not present

## 2018-09-12 ENCOUNTER — Other Ambulatory Visit: Payer: Self-pay | Admitting: Adult Health

## 2018-09-12 DIAGNOSIS — S134XXA Sprain of ligaments of cervical spine, initial encounter: Secondary | ICD-10-CM | POA: Diagnosis not present

## 2018-09-12 DIAGNOSIS — S233XXA Sprain of ligaments of thoracic spine, initial encounter: Secondary | ICD-10-CM | POA: Diagnosis not present

## 2018-09-12 DIAGNOSIS — S338XXA Sprain of other parts of lumbar spine and pelvis, initial encounter: Secondary | ICD-10-CM | POA: Diagnosis not present

## 2018-09-15 DIAGNOSIS — E113313 Type 2 diabetes mellitus with moderate nonproliferative diabetic retinopathy with macular edema, bilateral: Secondary | ICD-10-CM | POA: Diagnosis not present

## 2018-09-15 DIAGNOSIS — I2510 Atherosclerotic heart disease of native coronary artery without angina pectoris: Secondary | ICD-10-CM | POA: Diagnosis not present

## 2018-09-15 DIAGNOSIS — Z1389 Encounter for screening for other disorder: Secondary | ICD-10-CM | POA: Diagnosis not present

## 2018-09-15 DIAGNOSIS — Z955 Presence of coronary angioplasty implant and graft: Secondary | ICD-10-CM | POA: Diagnosis not present

## 2018-09-15 DIAGNOSIS — I10 Essential (primary) hypertension: Secondary | ICD-10-CM | POA: Diagnosis not present

## 2018-09-26 DIAGNOSIS — S338XXA Sprain of other parts of lumbar spine and pelvis, initial encounter: Secondary | ICD-10-CM | POA: Diagnosis not present

## 2018-09-26 DIAGNOSIS — S134XXA Sprain of ligaments of cervical spine, initial encounter: Secondary | ICD-10-CM | POA: Diagnosis not present

## 2018-09-26 DIAGNOSIS — S233XXA Sprain of ligaments of thoracic spine, initial encounter: Secondary | ICD-10-CM | POA: Diagnosis not present

## 2018-09-30 DIAGNOSIS — H43813 Vitreous degeneration, bilateral: Secondary | ICD-10-CM | POA: Diagnosis not present

## 2018-09-30 DIAGNOSIS — E113411 Type 2 diabetes mellitus with severe nonproliferative diabetic retinopathy with macular edema, right eye: Secondary | ICD-10-CM | POA: Diagnosis not present

## 2018-10-10 DIAGNOSIS — S233XXA Sprain of ligaments of thoracic spine, initial encounter: Secondary | ICD-10-CM | POA: Diagnosis not present

## 2018-10-10 DIAGNOSIS — S134XXA Sprain of ligaments of cervical spine, initial encounter: Secondary | ICD-10-CM | POA: Diagnosis not present

## 2018-10-10 DIAGNOSIS — S338XXA Sprain of other parts of lumbar spine and pelvis, initial encounter: Secondary | ICD-10-CM | POA: Diagnosis not present

## 2018-10-30 ENCOUNTER — Other Ambulatory Visit: Payer: Self-pay | Admitting: Cardiology

## 2018-11-07 DIAGNOSIS — S338XXA Sprain of other parts of lumbar spine and pelvis, initial encounter: Secondary | ICD-10-CM | POA: Diagnosis not present

## 2018-11-07 DIAGNOSIS — S134XXA Sprain of ligaments of cervical spine, initial encounter: Secondary | ICD-10-CM | POA: Diagnosis not present

## 2018-11-07 DIAGNOSIS — G4733 Obstructive sleep apnea (adult) (pediatric): Secondary | ICD-10-CM | POA: Diagnosis not present

## 2018-11-07 DIAGNOSIS — S233XXA Sprain of ligaments of thoracic spine, initial encounter: Secondary | ICD-10-CM | POA: Diagnosis not present

## 2018-11-10 DIAGNOSIS — E1159 Type 2 diabetes mellitus with other circulatory complications: Secondary | ICD-10-CM | POA: Diagnosis not present

## 2018-11-10 DIAGNOSIS — E782 Mixed hyperlipidemia: Secondary | ICD-10-CM | POA: Diagnosis not present

## 2018-11-10 DIAGNOSIS — Z9884 Bariatric surgery status: Secondary | ICD-10-CM | POA: Diagnosis not present

## 2018-11-20 DIAGNOSIS — E1142 Type 2 diabetes mellitus with diabetic polyneuropathy: Secondary | ICD-10-CM | POA: Diagnosis not present

## 2018-11-20 DIAGNOSIS — Z9884 Bariatric surgery status: Secondary | ICD-10-CM | POA: Diagnosis not present

## 2018-11-20 DIAGNOSIS — E1159 Type 2 diabetes mellitus with other circulatory complications: Secondary | ICD-10-CM | POA: Diagnosis not present

## 2018-11-20 DIAGNOSIS — Z23 Encounter for immunization: Secondary | ICD-10-CM | POA: Diagnosis not present

## 2018-11-20 DIAGNOSIS — E113313 Type 2 diabetes mellitus with moderate nonproliferative diabetic retinopathy with macular edema, bilateral: Secondary | ICD-10-CM | POA: Diagnosis not present

## 2018-11-21 DIAGNOSIS — S233XXA Sprain of ligaments of thoracic spine, initial encounter: Secondary | ICD-10-CM | POA: Diagnosis not present

## 2018-11-21 DIAGNOSIS — S338XXA Sprain of other parts of lumbar spine and pelvis, initial encounter: Secondary | ICD-10-CM | POA: Diagnosis not present

## 2018-11-21 DIAGNOSIS — S134XXA Sprain of ligaments of cervical spine, initial encounter: Secondary | ICD-10-CM | POA: Diagnosis not present

## 2018-12-02 DIAGNOSIS — E113411 Type 2 diabetes mellitus with severe nonproliferative diabetic retinopathy with macular edema, right eye: Secondary | ICD-10-CM | POA: Diagnosis not present

## 2018-12-02 DIAGNOSIS — H3562 Retinal hemorrhage, left eye: Secondary | ICD-10-CM | POA: Diagnosis not present

## 2018-12-02 DIAGNOSIS — E113412 Type 2 diabetes mellitus with severe nonproliferative diabetic retinopathy with macular edema, left eye: Secondary | ICD-10-CM | POA: Diagnosis not present

## 2018-12-02 DIAGNOSIS — H43813 Vitreous degeneration, bilateral: Secondary | ICD-10-CM | POA: Diagnosis not present

## 2018-12-05 DIAGNOSIS — S338XXA Sprain of other parts of lumbar spine and pelvis, initial encounter: Secondary | ICD-10-CM | POA: Diagnosis not present

## 2018-12-05 DIAGNOSIS — S233XXA Sprain of ligaments of thoracic spine, initial encounter: Secondary | ICD-10-CM | POA: Diagnosis not present

## 2018-12-05 DIAGNOSIS — S134XXA Sprain of ligaments of cervical spine, initial encounter: Secondary | ICD-10-CM | POA: Diagnosis not present

## 2018-12-19 DIAGNOSIS — M9902 Segmental and somatic dysfunction of thoracic region: Secondary | ICD-10-CM | POA: Diagnosis not present

## 2018-12-19 DIAGNOSIS — M9901 Segmental and somatic dysfunction of cervical region: Secondary | ICD-10-CM | POA: Diagnosis not present

## 2018-12-19 DIAGNOSIS — M9903 Segmental and somatic dysfunction of lumbar region: Secondary | ICD-10-CM | POA: Diagnosis not present

## 2018-12-19 DIAGNOSIS — S134XXA Sprain of ligaments of cervical spine, initial encounter: Secondary | ICD-10-CM | POA: Diagnosis not present

## 2018-12-19 DIAGNOSIS — S233XXA Sprain of ligaments of thoracic spine, initial encounter: Secondary | ICD-10-CM | POA: Diagnosis not present

## 2018-12-19 DIAGNOSIS — M47816 Spondylosis without myelopathy or radiculopathy, lumbar region: Secondary | ICD-10-CM | POA: Diagnosis not present

## 2019-01-02 DIAGNOSIS — M9903 Segmental and somatic dysfunction of lumbar region: Secondary | ICD-10-CM | POA: Diagnosis not present

## 2019-01-02 DIAGNOSIS — S134XXA Sprain of ligaments of cervical spine, initial encounter: Secondary | ICD-10-CM | POA: Diagnosis not present

## 2019-01-02 DIAGNOSIS — M9902 Segmental and somatic dysfunction of thoracic region: Secondary | ICD-10-CM | POA: Diagnosis not present

## 2019-01-02 DIAGNOSIS — S233XXA Sprain of ligaments of thoracic spine, initial encounter: Secondary | ICD-10-CM | POA: Diagnosis not present

## 2019-01-02 DIAGNOSIS — M47816 Spondylosis without myelopathy or radiculopathy, lumbar region: Secondary | ICD-10-CM | POA: Diagnosis not present

## 2019-01-02 DIAGNOSIS — M9901 Segmental and somatic dysfunction of cervical region: Secondary | ICD-10-CM | POA: Diagnosis not present

## 2019-01-16 DIAGNOSIS — M9903 Segmental and somatic dysfunction of lumbar region: Secondary | ICD-10-CM | POA: Diagnosis not present

## 2019-01-16 DIAGNOSIS — S233XXA Sprain of ligaments of thoracic spine, initial encounter: Secondary | ICD-10-CM | POA: Diagnosis not present

## 2019-01-16 DIAGNOSIS — M9902 Segmental and somatic dysfunction of thoracic region: Secondary | ICD-10-CM | POA: Diagnosis not present

## 2019-01-16 DIAGNOSIS — S134XXA Sprain of ligaments of cervical spine, initial encounter: Secondary | ICD-10-CM | POA: Diagnosis not present

## 2019-01-16 DIAGNOSIS — M47816 Spondylosis without myelopathy or radiculopathy, lumbar region: Secondary | ICD-10-CM | POA: Diagnosis not present

## 2019-01-16 DIAGNOSIS — M9901 Segmental and somatic dysfunction of cervical region: Secondary | ICD-10-CM | POA: Diagnosis not present

## 2019-01-21 DIAGNOSIS — H3562 Retinal hemorrhage, left eye: Secondary | ICD-10-CM | POA: Diagnosis not present

## 2019-01-21 DIAGNOSIS — E113411 Type 2 diabetes mellitus with severe nonproliferative diabetic retinopathy with macular edema, right eye: Secondary | ICD-10-CM | POA: Diagnosis not present

## 2019-01-21 DIAGNOSIS — E113412 Type 2 diabetes mellitus with severe nonproliferative diabetic retinopathy with macular edema, left eye: Secondary | ICD-10-CM | POA: Diagnosis not present

## 2019-01-21 DIAGNOSIS — H43813 Vitreous degeneration, bilateral: Secondary | ICD-10-CM | POA: Diagnosis not present

## 2019-01-30 DIAGNOSIS — M9901 Segmental and somatic dysfunction of cervical region: Secondary | ICD-10-CM | POA: Diagnosis not present

## 2019-01-30 DIAGNOSIS — S233XXA Sprain of ligaments of thoracic spine, initial encounter: Secondary | ICD-10-CM | POA: Diagnosis not present

## 2019-01-30 DIAGNOSIS — M47816 Spondylosis without myelopathy or radiculopathy, lumbar region: Secondary | ICD-10-CM | POA: Diagnosis not present

## 2019-01-30 DIAGNOSIS — M9903 Segmental and somatic dysfunction of lumbar region: Secondary | ICD-10-CM | POA: Diagnosis not present

## 2019-01-30 DIAGNOSIS — M9902 Segmental and somatic dysfunction of thoracic region: Secondary | ICD-10-CM | POA: Diagnosis not present

## 2019-01-30 DIAGNOSIS — S134XXA Sprain of ligaments of cervical spine, initial encounter: Secondary | ICD-10-CM | POA: Diagnosis not present

## 2019-02-03 DIAGNOSIS — I10 Essential (primary) hypertension: Secondary | ICD-10-CM | POA: Diagnosis not present

## 2019-02-03 DIAGNOSIS — E113412 Type 2 diabetes mellitus with severe nonproliferative diabetic retinopathy with macular edema, left eye: Secondary | ICD-10-CM | POA: Diagnosis not present

## 2019-02-03 DIAGNOSIS — Z9884 Bariatric surgery status: Secondary | ICD-10-CM | POA: Diagnosis not present

## 2019-02-03 DIAGNOSIS — E559 Vitamin D deficiency, unspecified: Secondary | ICD-10-CM | POA: Diagnosis not present

## 2019-02-03 DIAGNOSIS — E782 Mixed hyperlipidemia: Secondary | ICD-10-CM | POA: Diagnosis not present

## 2019-02-03 DIAGNOSIS — E876 Hypokalemia: Secondary | ICD-10-CM | POA: Diagnosis not present

## 2019-02-03 DIAGNOSIS — E538 Deficiency of other specified B group vitamins: Secondary | ICD-10-CM | POA: Diagnosis not present

## 2019-02-03 DIAGNOSIS — E1159 Type 2 diabetes mellitus with other circulatory complications: Secondary | ICD-10-CM | POA: Diagnosis not present

## 2019-02-13 DIAGNOSIS — M9901 Segmental and somatic dysfunction of cervical region: Secondary | ICD-10-CM | POA: Diagnosis not present

## 2019-02-13 DIAGNOSIS — S233XXA Sprain of ligaments of thoracic spine, initial encounter: Secondary | ICD-10-CM | POA: Diagnosis not present

## 2019-02-13 DIAGNOSIS — M9903 Segmental and somatic dysfunction of lumbar region: Secondary | ICD-10-CM | POA: Diagnosis not present

## 2019-02-13 DIAGNOSIS — M47816 Spondylosis without myelopathy or radiculopathy, lumbar region: Secondary | ICD-10-CM | POA: Diagnosis not present

## 2019-02-13 DIAGNOSIS — M9902 Segmental and somatic dysfunction of thoracic region: Secondary | ICD-10-CM | POA: Diagnosis not present

## 2019-02-13 DIAGNOSIS — S134XXA Sprain of ligaments of cervical spine, initial encounter: Secondary | ICD-10-CM | POA: Diagnosis not present

## 2019-02-27 DIAGNOSIS — M9902 Segmental and somatic dysfunction of thoracic region: Secondary | ICD-10-CM | POA: Diagnosis not present

## 2019-02-27 DIAGNOSIS — G4733 Obstructive sleep apnea (adult) (pediatric): Secondary | ICD-10-CM | POA: Diagnosis not present

## 2019-02-27 DIAGNOSIS — S233XXA Sprain of ligaments of thoracic spine, initial encounter: Secondary | ICD-10-CM | POA: Diagnosis not present

## 2019-02-27 DIAGNOSIS — M47816 Spondylosis without myelopathy or radiculopathy, lumbar region: Secondary | ICD-10-CM | POA: Diagnosis not present

## 2019-02-27 DIAGNOSIS — M9901 Segmental and somatic dysfunction of cervical region: Secondary | ICD-10-CM | POA: Diagnosis not present

## 2019-02-27 DIAGNOSIS — S134XXA Sprain of ligaments of cervical spine, initial encounter: Secondary | ICD-10-CM | POA: Diagnosis not present

## 2019-02-27 DIAGNOSIS — M9903 Segmental and somatic dysfunction of lumbar region: Secondary | ICD-10-CM | POA: Diagnosis not present

## 2019-03-11 DIAGNOSIS — S134XXA Sprain of ligaments of cervical spine, initial encounter: Secondary | ICD-10-CM | POA: Diagnosis not present

## 2019-03-11 DIAGNOSIS — S233XXA Sprain of ligaments of thoracic spine, initial encounter: Secondary | ICD-10-CM | POA: Diagnosis not present

## 2019-03-11 DIAGNOSIS — M9903 Segmental and somatic dysfunction of lumbar region: Secondary | ICD-10-CM | POA: Diagnosis not present

## 2019-03-11 DIAGNOSIS — M47816 Spondylosis without myelopathy or radiculopathy, lumbar region: Secondary | ICD-10-CM | POA: Diagnosis not present

## 2019-03-11 DIAGNOSIS — M9901 Segmental and somatic dysfunction of cervical region: Secondary | ICD-10-CM | POA: Diagnosis not present

## 2019-03-11 DIAGNOSIS — M9902 Segmental and somatic dysfunction of thoracic region: Secondary | ICD-10-CM | POA: Diagnosis not present

## 2019-03-12 DIAGNOSIS — E1143 Type 2 diabetes mellitus with diabetic autonomic (poly)neuropathy: Secondary | ICD-10-CM | POA: Diagnosis not present

## 2019-03-12 DIAGNOSIS — K760 Fatty (change of) liver, not elsewhere classified: Secondary | ICD-10-CM | POA: Diagnosis not present

## 2019-03-12 DIAGNOSIS — I10 Essential (primary) hypertension: Secondary | ICD-10-CM | POA: Diagnosis not present

## 2019-03-12 DIAGNOSIS — E782 Mixed hyperlipidemia: Secondary | ICD-10-CM | POA: Diagnosis not present

## 2019-03-16 DIAGNOSIS — H3562 Retinal hemorrhage, left eye: Secondary | ICD-10-CM | POA: Diagnosis not present

## 2019-03-16 DIAGNOSIS — E113411 Type 2 diabetes mellitus with severe nonproliferative diabetic retinopathy with macular edema, right eye: Secondary | ICD-10-CM | POA: Diagnosis not present

## 2019-03-16 DIAGNOSIS — E113412 Type 2 diabetes mellitus with severe nonproliferative diabetic retinopathy with macular edema, left eye: Secondary | ICD-10-CM | POA: Diagnosis not present

## 2019-03-16 DIAGNOSIS — H43813 Vitreous degeneration, bilateral: Secondary | ICD-10-CM | POA: Diagnosis not present

## 2019-03-18 DIAGNOSIS — E113313 Type 2 diabetes mellitus with moderate nonproliferative diabetic retinopathy with macular edema, bilateral: Secondary | ICD-10-CM | POA: Diagnosis not present

## 2019-03-18 DIAGNOSIS — E538 Deficiency of other specified B group vitamins: Secondary | ICD-10-CM | POA: Diagnosis not present

## 2019-03-18 DIAGNOSIS — E1142 Type 2 diabetes mellitus with diabetic polyneuropathy: Secondary | ICD-10-CM | POA: Diagnosis not present

## 2019-03-18 DIAGNOSIS — E782 Mixed hyperlipidemia: Secondary | ICD-10-CM | POA: Diagnosis not present

## 2019-03-18 DIAGNOSIS — Z9884 Bariatric surgery status: Secondary | ICD-10-CM | POA: Diagnosis not present

## 2019-03-18 DIAGNOSIS — E559 Vitamin D deficiency, unspecified: Secondary | ICD-10-CM | POA: Diagnosis not present

## 2019-03-18 DIAGNOSIS — E1159 Type 2 diabetes mellitus with other circulatory complications: Secondary | ICD-10-CM | POA: Diagnosis not present

## 2019-03-18 DIAGNOSIS — E876 Hypokalemia: Secondary | ICD-10-CM | POA: Diagnosis not present

## 2019-03-18 DIAGNOSIS — I10 Essential (primary) hypertension: Secondary | ICD-10-CM | POA: Diagnosis not present

## 2019-03-18 DIAGNOSIS — I2510 Atherosclerotic heart disease of native coronary artery without angina pectoris: Secondary | ICD-10-CM | POA: Diagnosis not present

## 2019-03-19 DIAGNOSIS — I10 Essential (primary) hypertension: Secondary | ICD-10-CM | POA: Diagnosis not present

## 2019-03-19 DIAGNOSIS — Z955 Presence of coronary angioplasty implant and graft: Secondary | ICD-10-CM | POA: Diagnosis not present

## 2019-03-19 DIAGNOSIS — I2510 Atherosclerotic heart disease of native coronary artery without angina pectoris: Secondary | ICD-10-CM | POA: Diagnosis not present

## 2019-03-19 DIAGNOSIS — E1143 Type 2 diabetes mellitus with diabetic autonomic (poly)neuropathy: Secondary | ICD-10-CM | POA: Diagnosis not present

## 2019-03-19 DIAGNOSIS — K760 Fatty (change of) liver, not elsewhere classified: Secondary | ICD-10-CM | POA: Diagnosis not present

## 2019-03-19 DIAGNOSIS — E113313 Type 2 diabetes mellitus with moderate nonproliferative diabetic retinopathy with macular edema, bilateral: Secondary | ICD-10-CM | POA: Diagnosis not present

## 2019-03-19 DIAGNOSIS — Z23 Encounter for immunization: Secondary | ICD-10-CM | POA: Diagnosis not present

## 2019-03-19 DIAGNOSIS — Z0001 Encounter for general adult medical examination with abnormal findings: Secondary | ICD-10-CM | POA: Diagnosis not present

## 2019-03-25 DIAGNOSIS — S134XXA Sprain of ligaments of cervical spine, initial encounter: Secondary | ICD-10-CM | POA: Diagnosis not present

## 2019-03-25 DIAGNOSIS — M47816 Spondylosis without myelopathy or radiculopathy, lumbar region: Secondary | ICD-10-CM | POA: Diagnosis not present

## 2019-03-25 DIAGNOSIS — M9902 Segmental and somatic dysfunction of thoracic region: Secondary | ICD-10-CM | POA: Diagnosis not present

## 2019-03-25 DIAGNOSIS — S233XXA Sprain of ligaments of thoracic spine, initial encounter: Secondary | ICD-10-CM | POA: Diagnosis not present

## 2019-03-25 DIAGNOSIS — M9903 Segmental and somatic dysfunction of lumbar region: Secondary | ICD-10-CM | POA: Diagnosis not present

## 2019-03-25 DIAGNOSIS — M9901 Segmental and somatic dysfunction of cervical region: Secondary | ICD-10-CM | POA: Diagnosis not present

## 2019-04-10 DIAGNOSIS — M9903 Segmental and somatic dysfunction of lumbar region: Secondary | ICD-10-CM | POA: Diagnosis not present

## 2019-04-10 DIAGNOSIS — S233XXA Sprain of ligaments of thoracic spine, initial encounter: Secondary | ICD-10-CM | POA: Diagnosis not present

## 2019-04-10 DIAGNOSIS — M47816 Spondylosis without myelopathy or radiculopathy, lumbar region: Secondary | ICD-10-CM | POA: Diagnosis not present

## 2019-04-10 DIAGNOSIS — M9901 Segmental and somatic dysfunction of cervical region: Secondary | ICD-10-CM | POA: Diagnosis not present

## 2019-04-10 DIAGNOSIS — M9902 Segmental and somatic dysfunction of thoracic region: Secondary | ICD-10-CM | POA: Diagnosis not present

## 2019-04-10 DIAGNOSIS — S134XXA Sprain of ligaments of cervical spine, initial encounter: Secondary | ICD-10-CM | POA: Diagnosis not present

## 2019-04-24 DIAGNOSIS — M9903 Segmental and somatic dysfunction of lumbar region: Secondary | ICD-10-CM | POA: Diagnosis not present

## 2019-04-24 DIAGNOSIS — M47816 Spondylosis without myelopathy or radiculopathy, lumbar region: Secondary | ICD-10-CM | POA: Diagnosis not present

## 2019-04-24 DIAGNOSIS — M9901 Segmental and somatic dysfunction of cervical region: Secondary | ICD-10-CM | POA: Diagnosis not present

## 2019-04-24 DIAGNOSIS — S134XXA Sprain of ligaments of cervical spine, initial encounter: Secondary | ICD-10-CM | POA: Diagnosis not present

## 2019-04-24 DIAGNOSIS — S233XXA Sprain of ligaments of thoracic spine, initial encounter: Secondary | ICD-10-CM | POA: Diagnosis not present

## 2019-04-24 DIAGNOSIS — M9902 Segmental and somatic dysfunction of thoracic region: Secondary | ICD-10-CM | POA: Diagnosis not present

## 2019-05-08 DIAGNOSIS — M9903 Segmental and somatic dysfunction of lumbar region: Secondary | ICD-10-CM | POA: Diagnosis not present

## 2019-05-08 DIAGNOSIS — S233XXA Sprain of ligaments of thoracic spine, initial encounter: Secondary | ICD-10-CM | POA: Diagnosis not present

## 2019-05-08 DIAGNOSIS — S134XXA Sprain of ligaments of cervical spine, initial encounter: Secondary | ICD-10-CM | POA: Diagnosis not present

## 2019-05-08 DIAGNOSIS — M9902 Segmental and somatic dysfunction of thoracic region: Secondary | ICD-10-CM | POA: Diagnosis not present

## 2019-05-08 DIAGNOSIS — M9901 Segmental and somatic dysfunction of cervical region: Secondary | ICD-10-CM | POA: Diagnosis not present

## 2019-05-08 DIAGNOSIS — M47816 Spondylosis without myelopathy or radiculopathy, lumbar region: Secondary | ICD-10-CM | POA: Diagnosis not present

## 2019-05-18 DIAGNOSIS — H3561 Retinal hemorrhage, right eye: Secondary | ICD-10-CM | POA: Diagnosis not present

## 2019-05-18 DIAGNOSIS — E113411 Type 2 diabetes mellitus with severe nonproliferative diabetic retinopathy with macular edema, right eye: Secondary | ICD-10-CM | POA: Diagnosis not present

## 2019-05-18 DIAGNOSIS — H43811 Vitreous degeneration, right eye: Secondary | ICD-10-CM | POA: Diagnosis not present

## 2019-05-22 DIAGNOSIS — M9903 Segmental and somatic dysfunction of lumbar region: Secondary | ICD-10-CM | POA: Diagnosis not present

## 2019-05-22 DIAGNOSIS — S233XXA Sprain of ligaments of thoracic spine, initial encounter: Secondary | ICD-10-CM | POA: Diagnosis not present

## 2019-05-22 DIAGNOSIS — M9902 Segmental and somatic dysfunction of thoracic region: Secondary | ICD-10-CM | POA: Diagnosis not present

## 2019-05-22 DIAGNOSIS — S134XXA Sprain of ligaments of cervical spine, initial encounter: Secondary | ICD-10-CM | POA: Diagnosis not present

## 2019-05-22 DIAGNOSIS — M9901 Segmental and somatic dysfunction of cervical region: Secondary | ICD-10-CM | POA: Diagnosis not present

## 2019-05-22 DIAGNOSIS — M47816 Spondylosis without myelopathy or radiculopathy, lumbar region: Secondary | ICD-10-CM | POA: Diagnosis not present

## 2019-06-05 DIAGNOSIS — M47816 Spondylosis without myelopathy or radiculopathy, lumbar region: Secondary | ICD-10-CM | POA: Diagnosis not present

## 2019-06-05 DIAGNOSIS — S134XXA Sprain of ligaments of cervical spine, initial encounter: Secondary | ICD-10-CM | POA: Diagnosis not present

## 2019-06-05 DIAGNOSIS — M9902 Segmental and somatic dysfunction of thoracic region: Secondary | ICD-10-CM | POA: Diagnosis not present

## 2019-06-05 DIAGNOSIS — S233XXA Sprain of ligaments of thoracic spine, initial encounter: Secondary | ICD-10-CM | POA: Diagnosis not present

## 2019-06-05 DIAGNOSIS — M9901 Segmental and somatic dysfunction of cervical region: Secondary | ICD-10-CM | POA: Diagnosis not present

## 2019-06-05 DIAGNOSIS — M9903 Segmental and somatic dysfunction of lumbar region: Secondary | ICD-10-CM | POA: Diagnosis not present

## 2019-06-10 DIAGNOSIS — E782 Mixed hyperlipidemia: Secondary | ICD-10-CM | POA: Diagnosis not present

## 2019-06-10 DIAGNOSIS — Z9884 Bariatric surgery status: Secondary | ICD-10-CM | POA: Diagnosis not present

## 2019-06-10 DIAGNOSIS — E1159 Type 2 diabetes mellitus with other circulatory complications: Secondary | ICD-10-CM | POA: Diagnosis not present

## 2019-06-10 DIAGNOSIS — E538 Deficiency of other specified B group vitamins: Secondary | ICD-10-CM | POA: Diagnosis not present

## 2019-06-18 DIAGNOSIS — E876 Hypokalemia: Secondary | ICD-10-CM | POA: Diagnosis not present

## 2019-06-18 DIAGNOSIS — E559 Vitamin D deficiency, unspecified: Secondary | ICD-10-CM | POA: Diagnosis not present

## 2019-06-18 DIAGNOSIS — E538 Deficiency of other specified B group vitamins: Secondary | ICD-10-CM | POA: Diagnosis not present

## 2019-06-18 DIAGNOSIS — E113313 Type 2 diabetes mellitus with moderate nonproliferative diabetic retinopathy with macular edema, bilateral: Secondary | ICD-10-CM | POA: Diagnosis not present

## 2019-06-18 DIAGNOSIS — Z9884 Bariatric surgery status: Secondary | ICD-10-CM | POA: Diagnosis not present

## 2019-06-18 DIAGNOSIS — I2510 Atherosclerotic heart disease of native coronary artery without angina pectoris: Secondary | ICD-10-CM | POA: Diagnosis not present

## 2019-06-18 DIAGNOSIS — E1159 Type 2 diabetes mellitus with other circulatory complications: Secondary | ICD-10-CM | POA: Diagnosis not present

## 2019-06-18 DIAGNOSIS — E782 Mixed hyperlipidemia: Secondary | ICD-10-CM | POA: Diagnosis not present

## 2019-06-18 DIAGNOSIS — E1142 Type 2 diabetes mellitus with diabetic polyneuropathy: Secondary | ICD-10-CM | POA: Diagnosis not present

## 2019-06-18 DIAGNOSIS — I10 Essential (primary) hypertension: Secondary | ICD-10-CM | POA: Diagnosis not present

## 2019-06-19 DIAGNOSIS — S233XXA Sprain of ligaments of thoracic spine, initial encounter: Secondary | ICD-10-CM | POA: Diagnosis not present

## 2019-06-19 DIAGNOSIS — M47816 Spondylosis without myelopathy or radiculopathy, lumbar region: Secondary | ICD-10-CM | POA: Diagnosis not present

## 2019-06-19 DIAGNOSIS — M9903 Segmental and somatic dysfunction of lumbar region: Secondary | ICD-10-CM | POA: Diagnosis not present

## 2019-06-19 DIAGNOSIS — M9901 Segmental and somatic dysfunction of cervical region: Secondary | ICD-10-CM | POA: Diagnosis not present

## 2019-06-19 DIAGNOSIS — S134XXA Sprain of ligaments of cervical spine, initial encounter: Secondary | ICD-10-CM | POA: Diagnosis not present

## 2019-06-19 DIAGNOSIS — M9902 Segmental and somatic dysfunction of thoracic region: Secondary | ICD-10-CM | POA: Diagnosis not present

## 2019-07-03 DIAGNOSIS — S134XXA Sprain of ligaments of cervical spine, initial encounter: Secondary | ICD-10-CM | POA: Diagnosis not present

## 2019-07-03 DIAGNOSIS — M9903 Segmental and somatic dysfunction of lumbar region: Secondary | ICD-10-CM | POA: Diagnosis not present

## 2019-07-03 DIAGNOSIS — M9901 Segmental and somatic dysfunction of cervical region: Secondary | ICD-10-CM | POA: Diagnosis not present

## 2019-07-03 DIAGNOSIS — S233XXA Sprain of ligaments of thoracic spine, initial encounter: Secondary | ICD-10-CM | POA: Diagnosis not present

## 2019-07-03 DIAGNOSIS — M47816 Spondylosis without myelopathy or radiculopathy, lumbar region: Secondary | ICD-10-CM | POA: Diagnosis not present

## 2019-07-03 DIAGNOSIS — M9902 Segmental and somatic dysfunction of thoracic region: Secondary | ICD-10-CM | POA: Diagnosis not present

## 2019-07-21 DIAGNOSIS — M9901 Segmental and somatic dysfunction of cervical region: Secondary | ICD-10-CM | POA: Diagnosis not present

## 2019-07-21 DIAGNOSIS — M9903 Segmental and somatic dysfunction of lumbar region: Secondary | ICD-10-CM | POA: Diagnosis not present

## 2019-07-21 DIAGNOSIS — S233XXA Sprain of ligaments of thoracic spine, initial encounter: Secondary | ICD-10-CM | POA: Diagnosis not present

## 2019-07-21 DIAGNOSIS — M9902 Segmental and somatic dysfunction of thoracic region: Secondary | ICD-10-CM | POA: Diagnosis not present

## 2019-07-21 DIAGNOSIS — S134XXA Sprain of ligaments of cervical spine, initial encounter: Secondary | ICD-10-CM | POA: Diagnosis not present

## 2019-07-21 DIAGNOSIS — M47816 Spondylosis without myelopathy or radiculopathy, lumbar region: Secondary | ICD-10-CM | POA: Diagnosis not present

## 2019-07-24 ENCOUNTER — Other Ambulatory Visit: Payer: Self-pay

## 2019-07-24 ENCOUNTER — Ambulatory Visit (INDEPENDENT_AMBULATORY_CARE_PROVIDER_SITE_OTHER): Payer: PPO | Admitting: Cardiology

## 2019-07-24 ENCOUNTER — Encounter: Payer: Self-pay | Admitting: Cardiology

## 2019-07-24 VITALS — BP 104/62 | HR 60 | Ht 71.0 in | Wt 215.2 lb

## 2019-07-24 DIAGNOSIS — E782 Mixed hyperlipidemia: Secondary | ICD-10-CM

## 2019-07-24 DIAGNOSIS — I2510 Atherosclerotic heart disease of native coronary artery without angina pectoris: Secondary | ICD-10-CM | POA: Diagnosis not present

## 2019-07-24 DIAGNOSIS — I10 Essential (primary) hypertension: Secondary | ICD-10-CM

## 2019-07-24 MED ORDER — ROSUVASTATIN CALCIUM 20 MG PO TABS: 20.0000 mg | ORAL_TABLET | Freq: Every day | ORAL | 1 refills | Status: DC

## 2019-07-24 NOTE — Patient Instructions (Signed)
Your physician recommends that you schedule a follow-up appointment in: 2 MONTHS WITH DR [facility]  Your physician has recommended you make the following change in your medication:   DECREASE CRESTOR 20 MG DAILY   Thank you for choosing [facility]!!

## 2019-07-24 NOTE — Progress Notes (Signed)
Clinical Summary [name] is a 64 y.o.male seen today for f/u of the following medical problems.  1.Obesity - s/p bariatric surgery 12/2015. Prior to surgery 310 lbs.    2. CAD - history of prior stent to LAD, with repeat balloon angioplsty of LAD stent in 2005 - last cath 2008 with patent stent and vessels. - 07/2015 echo LVEF 60-65% and now WMAs.  08/2015 nuclear stress test without ischemia.   - fullness midchest, can occur at any time. Some weakness, no other symptoms. Lasts up to hour. 3/10 in severity - exercising regularly. Elliptical x 30 minutes without troubles.  - compliant with meds  3. HTN - compliant with meds  4. Hyperlipidemia 02/2018 TC 85 TG 168 HDL 37 LDL 30  05/2019 TC 83 TG 115 HDL 37 LDL 30 - on crestor, vascepa  5. DM2  - followed by endocrinolgist Dr Legrand Como Atlheimer   6. Leg crams - ongoign 8 to 9 months, mainly at night time. 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  Had covid vaccine x 2.   Past Medical History:  Diagnosis Date  . Coronary artery disease    stents placed 2003  . Diabetes mellitus without complication ([place])   . Hyperlipidemia   . Hypertension   . Sleep apnea    on CPAP     No Known Allergies   Current Outpatient Medications  Medication Sig Dispense Refill  . aspirin 81 MG tablet Take 81 mg by mouth daily.    Marland Kitchen b complex vitamins tablet Take 1 tablet by mouth daily.    . Calcium 500-100 MG-UNIT CHEW Chew 1 tablet by mouth 4 (four) times daily.    . ergocalciferol (VITAMIN D2) 50000 units capsule Take 50,000 Units by mouth once a week.    Vanessa Kick Ethyl 1 g CAPS Take 2 capsules twice a day for triglycerides    . Magnesium 250 MG TABS Take by mouth 2 (two) times daily.    . metFORMIN (GLUCOPHAGE) 850 MG tablet Take 850 mg by mouth 2 (two) times daily with a meal.    . metoprolol succinate (TOPROL-XL) 25 MG 24 hr tablet TAKE 1 TABLET BY MOUTH   DAILY 90 tablet 3  . Multiple Vitamin (MULTIVITAMIN) tablet Take 1 tablet by mouth 2 (two) times daily.    . potassium chloride SA (K-DUR,KLOR-CON) 20 MEQ tablet Take 20 mEq by mouth 2 (two) times daily.     . ramipril (ALTACE) 2.5 MG capsule TAKE 1 CAPSULE BY MOUTH  DAILY 90 capsule 3  . rosuvastatin (CRESTOR) 40 MG tablet TAKE 1 TABLET BY MOUTH  DAILY 90 tablet 3  . vitamin B-12 (CYANOCOBALAMIN) 1000 MCG tablet Take 1,000 mcg by mouth daily.     No current facility-administered medications for this visit.     Past Surgical History:  Procedure Laterality Date  . BARIATRIC SURGERY  12/2015  . BICEPS TENDON REPAIR    . CARDIAC CATHETERIZATION    . KNEE ARTHROSCOPY Bilateral   . SHOULDER ARTHROSCOPY Bilateral   . TONSILLECTOMY    . UVULLA REMOVAL       No Known Allergies    Family History  Problem Relation Age of Onset  . Diabetes Mother   . Diabetes Father   . Heart attack Father      Social History [name] reports that he has never smoked. He has never used smokeless tobacco. [name] reports previous alcohol use.  Review of Systems CONSTITUTIONAL: No weight loss, fever, chills, weakness or fatigue.  HEENT: Eyes: No visual loss, blurred vision, double vision or yellow sclerae.No hearing loss, sneezing, congestion, runny nose or sore throat.  SKIN: No rash or itching.  CARDIOVASCULAR: per hpi RESPIRATORY: No shortness of breath, cough or sputum.  GASTROINTESTINAL: No anorexia, nausea, vomiting or diarrhea. No abdominal pain or blood.  GENITOURINARY: No burning on urination, no polyuria NEUROLOGICAL: No headache, dizziness, syncope, paralysis, ataxia, numbness or tingling in the extremities. No change in bowel or bladder control.  MUSCULOSKELETAL: No muscle, back pain, joint pain or stiffness.  LYMPHATICS: No enlarged nodes. No history of splenectomy.  PSYCHIATRIC: No history of depression or anxiety.  ENDOCRINOLOGIC: No reports of sweating, cold or heat  intolerance. No polyuria or polydipsia.  Marland Kitchen   Physical Examination Today's Vitals   07/24/19 0826  BP: 104/62  Pulse: 60  SpO2: 94%  Weight: 215 lb 3.2 oz (97.6 kg)  Height: 5\' 11"  (1.803 m)   Body mass index is 30.01 kg/m.  Gen: resting comfortably, no acute distress HEENT: no scleral icterus, pupils equal round and reactive, no palptable cervical adenopathy,  CV: RRR, no m/r/g, no jvd Resp: Clear to auscultation bilaterally GI: abdomen is soft, non-tender, non-distended, normal bowel sounds, no hepatosplenomegaly MSK: extremities are warm, no edema.  Skin: warm, no rash Neuro:  no focal deficits Psych: appropriate affect   Diagnostic Studies  02/2007 cath CONCLUSION: 1. Preserved left ventricular function with ejection fraction in  excess of 60%. 2. Continued patency of the stent site at the site of subsequent  cutting balloon angioplasty. 3. Scattered coronary irregularities as noted above. 4. Probable poor control of diabetes mellitus.  RECOMMENDATIONS: We will likely recommend medical therapy. We will keep him overnight and recheck his enzymes.   07/2015 echo Study Conclusions  - Left ventricle: The cavity size was normal. Wall thickness was increased in a pattern of moderate LVH. Systolic function was normal. The estimated ejection fraction was in the range of 60% to 65%. Wall motion was normal; there were no regional wall motion abnormalities. Left ventricular diastolic function parameters were normal. - Aortic valve: Valve area (VTI): 4.3 cm^2. Valve area (Vmax): 4.12 cm^2. Valve area (Vmean): 3.19 cm^2. - Technically adequate study.  08/2015 Nuclear stress test  Blood pressure demonstrated a hypertensive response to exercise.  There was no ST segment deviation noted during stress.  Defect 1: There is a small defect of mild severity present in the mid inferior, mid inferolateral and apical inferior location. This is  consistent with soft tissue attenuation artifact. No areas of ischemia or infarction.  This is a low risk study.  Nuclear stress EF: 68%.   Assessment and Plan   1. CAD -nonspecific nonexertional symptoms. Exercises 30 min on ellipitical several times a week without isues - monitor symtoms at this time.   2. HTN - at goal, continue current meds   3. Hyperlipidemia -at goal. Some leg cramps unclear etiology, lower crestor to 20mg  daily and monitor   F/u 2 months reassess chest symptoms and leg cramps    09/2015, M.D.

## 2019-07-27 ENCOUNTER — Other Ambulatory Visit: Payer: Self-pay

## 2019-07-27 ENCOUNTER — Encounter (INDEPENDENT_AMBULATORY_CARE_PROVIDER_SITE_OTHER): Payer: Self-pay | Admitting: Ophthalmology

## 2019-07-27 ENCOUNTER — Ambulatory Visit (INDEPENDENT_AMBULATORY_CARE_PROVIDER_SITE_OTHER): Payer: PPO | Admitting: Ophthalmology

## 2019-07-27 DIAGNOSIS — E113411 Type 2 diabetes mellitus with severe nonproliferative diabetic retinopathy with macular edema, right eye: Secondary | ICD-10-CM

## 2019-07-27 DIAGNOSIS — E113492 Type 2 diabetes mellitus with severe nonproliferative diabetic retinopathy without macular edema, left eye: Secondary | ICD-10-CM | POA: Diagnosis not present

## 2019-07-27 MED ORDER — AFLIBERCEPT 2MG/0.05ML IZ SOLN FOR KALEIDOSCOPE
2.0000 mg | INTRAVITREAL | Status: AC | PRN
  Administered 2019-07-27: 2 mg via INTRAVITREAL

## 2019-07-27 NOTE — Assessment & Plan Note (Signed)
The nature of diabetic macular edema was discussed with the patient. Treatment options were outlined including medical therapy, laser & vitrectomy. The use of injectable medications reviewed, including Avastin, Lucentis, and Eylea. Periodic injections into the eye are likely to resolve diabetic macular edema (swelling in the center of vision). Initially, injections are delivered are delivered every 4-6 weeks, and the interval extended as the condition improves. On average, 8-9 injections the first year, and 5 in year 2. Improvement in the condition most often improves on medical therapy. Occasional use of focal laser is also recommended for residual macular edema (swelling). Excellent control of blood glucose and blood pressure are encouraged under the care of a primary physician or endocrinologist. Similarly, attempts to maintain serum cholesterol, low density lipoproteins, and high-density lipoproteins in a favorable range were recommended.    Today, overall improved and stable yet slightly worse temporal thickening.  Repeat Eylea today at 10-week interval and examination again in 10 to 12 weeks OD

## 2019-07-27 NOTE — Progress Notes (Signed)
07/27/2019     CHIEF COMPLAINT Patient presents for Retina Follow Up   HISTORY OF PRESENT ILLNESS: [name] is a 64 y.o. male who presents to the clinic today for:   HPI    Retina Follow Up    Patient presents with  Diabetic Retinopathy.  In both eyes.  Duration of 10 weeks.  Since onset it is gradually improving.          Comments    10 week follow up- OCT OU, Possible Eylea OU?? Patient states he thinks his vision is improving and overall has no complaints. LBS 130 This AM A1C 5.16 May 2019       Last edited by Berenice Bouton on 07/27/2019  8:13 AM. (History)      Referring physician: Juliette Alcide, MD [address] [place],  [place] [address]  HISTORICAL INFORMATION:   Selected notes from the MEDICAL RECORD NUMBER    Lab Results  Component Value Date   HGBA1C (H) 02/19/2007    8.5 (NOTE)   The ADA recommends the following therapeutic goals for glycemic   control related to Hgb A1C measurement:   Goal of Therapy:   < 7.0% Hgb A1C   Action Suggested:  > 8.0% Hgb A1C   Ref:  Diabetes Care, 22, Suppl. 1, 1999     CURRENT MEDICATIONS: No current outpatient medications on file. (Ophthalmic Drugs)   No current facility-administered medications for this visit. (Ophthalmic Drugs)   Current Outpatient Medications (Other)  Medication Sig  . aspirin 81 MG tablet Take 81 mg by mouth daily.  Marland Kitchen b complex vitamins tablet Take 1 tablet by mouth daily.  . Calcium 500-100 MG-UNIT CHEW Chew 1 tablet by mouth 4 (four) times daily.  . empagliflozin (JARDIANCE) 25 MG TABS tablet Take 25 mg by mouth daily.  . ergocalciferol (VITAMIN D2) 50000 units capsule Take 50,000 Units by mouth once a week.  . ferrous sulfate 325 (65 FE) MG EC tablet Take 325 mg by mouth daily.  Bess Harvest Ethyl 1 g CAPS Take 2 capsules twice a day for triglycerides  . Magnesium 250 MG TABS Take by mouth 2 (two) times daily.  . metFORMIN (GLUCOPHAGE) 850 MG tablet Take 850 mg by mouth 2 (two) times daily  with a meal.  . metoprolol succinate (TOPROL-XL) 25 MG 24 hr tablet TAKE 1 TABLET BY MOUTH  DAILY  . Multiple Vitamin (MULTIVITAMIN) tablet Take 1 tablet by mouth 2 (two) times daily.  . potassium chloride SA (K-DUR,KLOR-CON) 20 MEQ tablet Take 20 mEq by mouth 2 (two) times daily.   . ramipril (ALTACE) 2.5 MG capsule TAKE 1 CAPSULE BY MOUTH  DAILY  . rosuvastatin (CRESTOR) 20 MG tablet Take 1 tablet (20 mg total) by mouth daily.  . vitamin B-12 (CYANOCOBALAMIN) 1000 MCG tablet Take 1,000 mcg by mouth daily.   No current facility-administered medications for this visit. (Other)      REVIEW OF SYSTEMS:    ALLERGIES No Known Allergies  PAST MEDICAL HISTORY Past Medical History:  Diagnosis Date  . Coronary artery disease    stents placed 2003  . Diabetes mellitus without complication (HCC)   . Hyperlipidemia   . Hypertension   . Sleep apnea    on CPAP   Past Surgical History:  Procedure Laterality Date  . BARIATRIC SURGERY  12/2015  . BICEPS TENDON REPAIR    . CARDIAC CATHETERIZATION    . KNEE ARTHROSCOPY Bilateral   . SHOULDER ARTHROSCOPY  Bilateral   . TONSILLECTOMY    . UVULLA REMOVAL      FAMILY HISTORY Family History  Problem Relation Age of Onset  . Diabetes Mother   . Diabetes Father   . Heart attack Father     SOCIAL HISTORY Social History   Tobacco Use  . Smoking status: Never Smoker  . Smokeless tobacco: Never Used  Substance Use Topics  . Alcohol use: Not Currently    Alcohol/week: 0.0 standard drinks  . Drug use: Never         OPHTHALMIC EXAM: Base Eye Exam    Visual Acuity (Snellen - Linear)      Right Left   Dist [place] 20/25+2 20/80-2   Dist ph [place]  20/25-1       Tonometry (Tonopen, 8:18 AM)      Right Left   Pressure 13 14       Pupils      Pupils Dark Light Shape React APD   Right PERRL 4 3 Round Slow None   Left PERRL 4 3 Round Slow None       Visual Fields (Counting fingers)      Left Right    Full Full        Extraocular Movement      Right Left    Full Full       Neuro/Psych    Oriented x3: Yes   Mood/Affect: Normal       Dilation    Both eyes: 1.0% Mydriacyl, 2.5% Phenylephrine @ 8:18 AM        Slit Lamp and Fundus Exam    External Exam      Right Left   External Normal Normal       Slit Lamp Exam      Right Left   Lids/Lashes Normal Normal   Conjunctiva/Sclera White and quiet White and quiet   Cornea Clear Clear   Anterior Chamber Deep and quiet Deep and quiet   Iris Round and reactive Round and reactive   Lens Posterior chamber intraocular lens Posterior chamber intraocular lens   Vitreous Normal Normal          IMAGING AND PROCEDURES  Imaging and Procedures for 07/27/19  OCT, Retina - OU - Both Eyes       Right Eye Quality was good. Scan locations included subfoveal.   Left Eye Quality was good. Scan locations included subfoveal.                 ASSESSMENT/PLAN:  No problem-specific Assessment & Plan notes found for this encounter.    No diagnosis found.  1.  2.  3.  Ophthalmic Meds Ordered this visit:  No orders of the defined types were placed in this encounter.      No follow-ups on file.  There are no Patient Instructions on file for this visit.   Explained the diagnoses, plan, and follow up with the patient and they expressed understanding.  Patient expressed understanding of the importance of proper follow up care.   Clent Demark Jonte Shiller M.D. Diseases & Surgery of the Retina and Vitreous [facility] 07/27/19     Abbreviations: M myopia (nearsighted); A astigmatism; H hyperopia (farsighted); P presbyopia; Mrx spectacle prescription;  CTL contact lenses; OD right eye; OS left eye; OU both eyes  XT exotropia; ET esotropia; PEK punctate epithelial keratitis; PEE punctate epithelial erosions; DES dry eye syndrome; MGD meibomian gland dysfunction; ATs artificial tears; PFAT's preservative free  artificial tears; NSC  nuclear sclerotic cataract; PSC posterior subcapsular cataract; ERM epi-retinal membrane; PVD posterior vitreous detachment; RD retinal detachment; DM diabetes mellitus; DR diabetic retinopathy; NPDR non-proliferative diabetic retinopathy; PDR proliferative diabetic retinopathy; CSME clinically significant macular edema; DME diabetic macular edema; dbh dot blot hemorrhages; CWS cotton wool spot; POAG primary open angle glaucoma; C/D cup-to-disc ratio; HVF humphrey visual field; GVF goldmann visual field; OCT optical coherence tomography; IOP intraocular pressure; BRVO Branch retinal vein occlusion; CRVO central retinal vein occlusion; CRAO central retinal artery occlusion; BRAO branch retinal artery occlusion; RT retinal tear; SB scleral buckle; PPV pars plana vitrectomy; VH Vitreous hemorrhage; PRP panretinal laser photocoagulation; IVK intravitreal kenalog; VMT vitreomacular traction; MH Macular hole;  NVD neovascularization of the disc; NVE neovascularization elsewhere; AREDS age related eye disease study; ARMD age related macular degeneration; POAG primary open angle glaucoma; EBMD epithelial/anterior basement membrane dystrophy; ACIOL anterior chamber intraocular lens; IOL intraocular lens; PCIOL posterior chamber intraocular lens; Phaco/IOL phacoemulsification with intraocular lens placement; PRK photorefractive keratectomy; LASIK laser assisted in situ keratomileusis; HTN hypertension; DM diabetes mellitus; COPD chronic obstructive pulmonary disease

## 2019-07-31 DIAGNOSIS — M9903 Segmental and somatic dysfunction of lumbar region: Secondary | ICD-10-CM | POA: Diagnosis not present

## 2019-07-31 DIAGNOSIS — M9901 Segmental and somatic dysfunction of cervical region: Secondary | ICD-10-CM | POA: Diagnosis not present

## 2019-07-31 DIAGNOSIS — S233XXA Sprain of ligaments of thoracic spine, initial encounter: Secondary | ICD-10-CM | POA: Diagnosis not present

## 2019-07-31 DIAGNOSIS — M9902 Segmental and somatic dysfunction of thoracic region: Secondary | ICD-10-CM | POA: Diagnosis not present

## 2019-07-31 DIAGNOSIS — M47816 Spondylosis without myelopathy or radiculopathy, lumbar region: Secondary | ICD-10-CM | POA: Diagnosis not present

## 2019-07-31 DIAGNOSIS — S134XXA Sprain of ligaments of cervical spine, initial encounter: Secondary | ICD-10-CM | POA: Diagnosis not present

## 2019-08-14 DIAGNOSIS — M9902 Segmental and somatic dysfunction of thoracic region: Secondary | ICD-10-CM | POA: Diagnosis not present

## 2019-08-14 DIAGNOSIS — M9903 Segmental and somatic dysfunction of lumbar region: Secondary | ICD-10-CM | POA: Diagnosis not present

## 2019-08-14 DIAGNOSIS — S134XXA Sprain of ligaments of cervical spine, initial encounter: Secondary | ICD-10-CM | POA: Diagnosis not present

## 2019-08-14 DIAGNOSIS — M9901 Segmental and somatic dysfunction of cervical region: Secondary | ICD-10-CM | POA: Diagnosis not present

## 2019-08-14 DIAGNOSIS — S233XXA Sprain of ligaments of thoracic spine, initial encounter: Secondary | ICD-10-CM | POA: Diagnosis not present

## 2019-08-14 DIAGNOSIS — M47816 Spondylosis without myelopathy or radiculopathy, lumbar region: Secondary | ICD-10-CM | POA: Diagnosis not present

## 2019-08-31 DIAGNOSIS — M9902 Segmental and somatic dysfunction of thoracic region: Secondary | ICD-10-CM | POA: Diagnosis not present

## 2019-08-31 DIAGNOSIS — M9901 Segmental and somatic dysfunction of cervical region: Secondary | ICD-10-CM | POA: Diagnosis not present

## 2019-08-31 DIAGNOSIS — M9903 Segmental and somatic dysfunction of lumbar region: Secondary | ICD-10-CM | POA: Diagnosis not present

## 2019-08-31 DIAGNOSIS — S134XXA Sprain of ligaments of cervical spine, initial encounter: Secondary | ICD-10-CM | POA: Diagnosis not present

## 2019-08-31 DIAGNOSIS — S233XXA Sprain of ligaments of thoracic spine, initial encounter: Secondary | ICD-10-CM | POA: Diagnosis not present

## 2019-08-31 DIAGNOSIS — M47816 Spondylosis without myelopathy or radiculopathy, lumbar region: Secondary | ICD-10-CM | POA: Diagnosis not present

## 2019-09-10 DIAGNOSIS — E1143 Type 2 diabetes mellitus with diabetic autonomic (poly)neuropathy: Secondary | ICD-10-CM | POA: Diagnosis not present

## 2019-09-10 DIAGNOSIS — I10 Essential (primary) hypertension: Secondary | ICD-10-CM | POA: Diagnosis not present

## 2019-09-10 DIAGNOSIS — E782 Mixed hyperlipidemia: Secondary | ICD-10-CM | POA: Diagnosis not present

## 2019-09-10 DIAGNOSIS — K760 Fatty (change of) liver, not elsewhere classified: Secondary | ICD-10-CM | POA: Diagnosis not present

## 2019-09-10 DIAGNOSIS — R5383 Other fatigue: Secondary | ICD-10-CM | POA: Diagnosis not present

## 2019-09-11 DIAGNOSIS — M9903 Segmental and somatic dysfunction of lumbar region: Secondary | ICD-10-CM | POA: Diagnosis not present

## 2019-09-11 DIAGNOSIS — M47816 Spondylosis without myelopathy or radiculopathy, lumbar region: Secondary | ICD-10-CM | POA: Diagnosis not present

## 2019-09-11 DIAGNOSIS — M9901 Segmental and somatic dysfunction of cervical region: Secondary | ICD-10-CM | POA: Diagnosis not present

## 2019-09-11 DIAGNOSIS — S134XXA Sprain of ligaments of cervical spine, initial encounter: Secondary | ICD-10-CM | POA: Diagnosis not present

## 2019-09-11 DIAGNOSIS — S233XXA Sprain of ligaments of thoracic spine, initial encounter: Secondary | ICD-10-CM | POA: Diagnosis not present

## 2019-09-11 DIAGNOSIS — M9902 Segmental and somatic dysfunction of thoracic region: Secondary | ICD-10-CM | POA: Diagnosis not present

## 2019-09-14 ENCOUNTER — Encounter: Payer: Self-pay | Admitting: Cardiology

## 2019-09-14 DIAGNOSIS — I10 Essential (primary) hypertension: Secondary | ICD-10-CM | POA: Diagnosis not present

## 2019-09-14 DIAGNOSIS — E782 Mixed hyperlipidemia: Secondary | ICD-10-CM | POA: Diagnosis not present

## 2019-09-14 DIAGNOSIS — E1143 Type 2 diabetes mellitus with diabetic autonomic (poly)neuropathy: Secondary | ICD-10-CM | POA: Diagnosis not present

## 2019-09-14 DIAGNOSIS — Z6829 Body mass index (BMI) 29.0-29.9, adult: Secondary | ICD-10-CM | POA: Diagnosis not present

## 2019-09-14 DIAGNOSIS — D692 Other nonthrombocytopenic purpura: Secondary | ICD-10-CM | POA: Diagnosis not present

## 2019-09-14 DIAGNOSIS — I4891 Unspecified atrial fibrillation: Secondary | ICD-10-CM | POA: Diagnosis not present

## 2019-09-14 DIAGNOSIS — H6121 Impacted cerumen, right ear: Secondary | ICD-10-CM | POA: Diagnosis not present

## 2019-09-14 DIAGNOSIS — E113313 Type 2 diabetes mellitus with moderate nonproliferative diabetic retinopathy with macular edema, bilateral: Secondary | ICD-10-CM | POA: Diagnosis not present

## 2019-09-24 ENCOUNTER — Encounter: Payer: Self-pay | Admitting: *Deleted

## 2019-09-24 ENCOUNTER — Encounter: Payer: Self-pay | Admitting: Cardiology

## 2019-09-24 ENCOUNTER — Ambulatory Visit (INDEPENDENT_AMBULATORY_CARE_PROVIDER_SITE_OTHER): Payer: PPO | Admitting: Cardiology

## 2019-09-24 VITALS — BP 104/68 | HR 79 | Ht 71.0 in | Wt 211.2 lb

## 2019-09-24 DIAGNOSIS — Z7984 Long term (current) use of oral hypoglycemic drugs: Secondary | ICD-10-CM | POA: Diagnosis not present

## 2019-09-24 DIAGNOSIS — E782 Mixed hyperlipidemia: Secondary | ICD-10-CM | POA: Diagnosis not present

## 2019-09-24 DIAGNOSIS — I10 Essential (primary) hypertension: Secondary | ICD-10-CM | POA: Diagnosis not present

## 2019-09-24 DIAGNOSIS — I2510 Atherosclerotic heart disease of native coronary artery without angina pectoris: Secondary | ICD-10-CM | POA: Diagnosis not present

## 2019-09-24 DIAGNOSIS — E1142 Type 2 diabetes mellitus with diabetic polyneuropathy: Secondary | ICD-10-CM | POA: Diagnosis not present

## 2019-09-24 DIAGNOSIS — E559 Vitamin D deficiency, unspecified: Secondary | ICD-10-CM | POA: Diagnosis not present

## 2019-09-24 DIAGNOSIS — E113313 Type 2 diabetes mellitus with moderate nonproliferative diabetic retinopathy with macular edema, bilateral: Secondary | ICD-10-CM | POA: Diagnosis not present

## 2019-09-24 DIAGNOSIS — Z9884 Bariatric surgery status: Secondary | ICD-10-CM | POA: Diagnosis not present

## 2019-09-24 DIAGNOSIS — M159 Polyosteoarthritis, unspecified: Secondary | ICD-10-CM | POA: Diagnosis not present

## 2019-09-24 DIAGNOSIS — G4730 Sleep apnea, unspecified: Secondary | ICD-10-CM | POA: Diagnosis not present

## 2019-09-24 DIAGNOSIS — E1159 Type 2 diabetes mellitus with other circulatory complications: Secondary | ICD-10-CM | POA: Diagnosis not present

## 2019-09-24 NOTE — Patient Instructions (Addendum)

## 2019-09-24 NOTE — Progress Notes (Signed)
Clinical Summary [name] is a 64 y.o.male seen today for f/u of the following medical problems.This is a focused visit on recent symptoms of chest pain and leg cramps reported at last visit.     1. CAD - history of prior stent to LAD, with repeat balloon angioplsty of LAD stent in 2005 - last cath 2008 with patent stent and vessels. - 07/2015 echo LVEF 60-65% and now WMAs.  08/2015 nuclear stress test without ischemia.   - last visit reproted a fullness midchest, can occur at any time. Some weakness, no other symptoms. Lasts up to hour. 3/10 in severity - exercising regularly. Elliptical x 30 minutes without troubles.    - since last visit symptoms have resolved    2. Hyperlipidemia 02/2018 TC 85 TG 168 HDL 37 LDL 30  05/2019 TC 83 TG 115 HDL 37 LDL 30 - on crestor, vascepa - some improvement with crestor 20mg  in leg cramps. At a point where symptoms are tolerab.e    SH: retired in 2010, former 2011 [employer]. 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 [employer] fan  Had covid vaccine x 2.         Past Medical History:  Diagnosis Date  . Coronary artery disease    stents placed 2003  . Diabetes mellitus without complication (HCC)   . Hyperlipidemia   . Hypertension   . Sleep apnea    on CPAP     No Known Allergies   Current Outpatient Medications  Medication Sig Dispense Refill  . aspirin 81 MG tablet Take 81 mg by mouth daily.    2004 b complex vitamins tablet Take 1 tablet by mouth daily.    . Calcium 500-100 MG-UNIT CHEW Chew 1 tablet by mouth 4 (four) times daily.    . empagliflozin (JARDIANCE) 25 MG TABS tablet Take 25 mg by mouth daily.    . ergocalciferol (VITAMIN D2) 50000 units capsule Take 50,000 Units by mouth once a week.    . ferrous sulfate 325 (65 FE) MG EC tablet Take 325 mg by mouth daily.    Marland Kitchen Ethyl 1 g CAPS Take 2 capsules twice a day for triglycerides    . Magnesium 250 MG TABS Take by mouth 2  (two) times daily.    . metFORMIN (GLUCOPHAGE) 850 MG tablet Take 850 mg by mouth 2 (two) times daily with a meal.    . metoprolol succinate (TOPROL-XL) 25 MG 24 hr tablet TAKE 1 TABLET BY MOUTH  DAILY 90 tablet 3  . Multiple Vitamin (MULTIVITAMIN) tablet Take 1 tablet by mouth 2 (two) times daily.    . potassium chloride SA (K-DUR,KLOR-CON) 20 MEQ tablet Take 20 mEq by mouth 2 (two) times daily.     . ramipril (ALTACE) 2.5 MG capsule TAKE 1 CAPSULE BY MOUTH  DAILY 90 capsule 3  . rosuvastatin (CRESTOR) 20 MG tablet Take 1 tablet (20 mg total) by mouth daily. 90 tablet 1  . vitamin B-12 (CYANOCOBALAMIN) 1000 MCG tablet Take 1,000 mcg by mouth daily.     No current facility-administered medications for this visit.     Past Surgical History:  Procedure Laterality Date  . BARIATRIC SURGERY  12/2015  . BICEPS TENDON REPAIR    . CARDIAC CATHETERIZATION    . KNEE ARTHROSCOPY Bilateral   . SHOULDER ARTHROSCOPY Bilateral   . TONSILLECTOMY    . UVULLA REMOVAL       No Known Allergies    Family  History  Problem Relation Age of Onset  . Diabetes Mother   . Diabetes Father   . Heart attack Father      Social History [name] reports that he has never smoked. He has never used smokeless tobacco. [name] reports previous alcohol use.   Review of Systems CONSTITUTIONAL: No weight loss, fever, chills, weakness or fatigue.  HEENT: Eyes: No visual loss, blurred vision, double vision or yellow sclerae.No hearing loss, sneezing, congestion, runny nose or sore throat.  SKIN: No rash or itching.  CARDIOVASCULAR: per hpi RESPIRATORY: No shortness of breath, cough or sputum.  GASTROINTESTINAL: No anorexia, nausea, vomiting or diarrhea. No abdominal pain or blood.  GENITOURINARY: No burning on urination, no polyuria NEUROLOGICAL: No headache, dizziness, syncope, paralysis, ataxia, numbness or tingling in the extremities. No change in bowel or bladder control.  MUSCULOSKELETAL: No muscle,  back pain, joint pain or stiffness.  LYMPHATICS: No enlarged nodes. No history of splenectomy.  PSYCHIATRIC: No history of depression or anxiety.  ENDOCRINOLOGIC: No reports of sweating, cold or heat intolerance. No polyuria or polydipsia.  Marland Kitchen   Physical Examination Today's Vitals   09/24/19 1342  BP: 104/68  Pulse: 79  SpO2: 96%  Weight: 211 lb 3.2 oz (95.8 kg)  Height: 5\' 11"  (1.803 m)   Body mass index is 29.46 kg/m.  Gen: resting comfortably, no acute distress HEENT: no scleral icterus, pupils equal round and reactive, no palptable cervical adenopathy,  CV: RRR, no m/r/g ,no jvd Resp: Clear to auscultation bilaterally GI: abdomen is soft, non-tender, non-distended, normal bowel sounds, no hepatosplenomegaly MSK: extremities are warm, no edema.  Skin: warm, no rash Neuro:  no focal deficits Psych: appropriate affect   Diagnostic Studies 02/2007 cath CONCLUSION: 1. Preserved left ventricular function with ejection fraction in  excess of 60%. 2. Continued patency of the stent site at the site of subsequent  cutting balloon angioplasty. 3. Scattered coronary irregularities as noted above. 4. Probable poor control of diabetes mellitus.  RECOMMENDATIONS: We will likely recommend medical therapy. We will keep him overnight and recheck his enzymes.   07/2015 echo Study Conclusions  - Left ventricle: The cavity size was normal. Wall thickness was increased in a pattern of moderate LVH. Systolic function was normal. The estimated ejection fraction was in the range of 60% to 65%. Wall motion was normal; there were no regional wall motion abnormalities. Left ventricular diastolic function parameters were normal. - Aortic valve: Valve area (VTI): 4.3 cm^2. Valve area (Vmax): 4.12 cm^2. Valve area (Vmean): 3.19 cm^2. - Technically adequate study.  08/2015 Nuclear stress test  Blood pressure demonstrated a hypertensive response to  exercise.  There was no ST segment deviation noted during stress.  Defect 1: There is a small defect of mild severity present in the mid inferior, mid inferolateral and apical inferior location. This is consistent with soft tissue attenuation artifact. No areas of ischemia or infarction.  This is a low risk study.  Nuclear stress EF: 68%.    Assessment and Plan  1. CAD -nonspecific nonexertional symptoms last visit that have since resolved - remains very physically active exercising regularly without symptoms - continue current meds at this time.    2. Leg cramps/Hyperlipidemia - improved with crestor 20mg  daily.  - continue current statin, requets pcp labs      09/2015, M.D.

## 2019-09-25 DIAGNOSIS — M9902 Segmental and somatic dysfunction of thoracic region: Secondary | ICD-10-CM | POA: Diagnosis not present

## 2019-09-25 DIAGNOSIS — M9903 Segmental and somatic dysfunction of lumbar region: Secondary | ICD-10-CM | POA: Diagnosis not present

## 2019-09-25 DIAGNOSIS — S233XXA Sprain of ligaments of thoracic spine, initial encounter: Secondary | ICD-10-CM | POA: Diagnosis not present

## 2019-09-25 DIAGNOSIS — M47816 Spondylosis without myelopathy or radiculopathy, lumbar region: Secondary | ICD-10-CM | POA: Diagnosis not present

## 2019-09-25 DIAGNOSIS — S134XXA Sprain of ligaments of cervical spine, initial encounter: Secondary | ICD-10-CM | POA: Diagnosis not present

## 2019-09-25 DIAGNOSIS — M9901 Segmental and somatic dysfunction of cervical region: Secondary | ICD-10-CM | POA: Diagnosis not present

## 2019-10-05 ENCOUNTER — Encounter (INDEPENDENT_AMBULATORY_CARE_PROVIDER_SITE_OTHER): Payer: Self-pay | Admitting: Ophthalmology

## 2019-10-05 ENCOUNTER — Other Ambulatory Visit: Payer: Self-pay

## 2019-10-05 ENCOUNTER — Ambulatory Visit (INDEPENDENT_AMBULATORY_CARE_PROVIDER_SITE_OTHER): Payer: PPO | Admitting: Ophthalmology

## 2019-10-05 DIAGNOSIS — E113411 Type 2 diabetes mellitus with severe nonproliferative diabetic retinopathy with macular edema, right eye: Secondary | ICD-10-CM

## 2019-10-05 MED ORDER — AFLIBERCEPT 2MG/0.05ML IZ SOLN FOR KALEIDOSCOPE
2.0000 mg | INTRAVITREAL | Status: AC | PRN
  Administered 2019-10-05: 2 mg via INTRAVITREAL

## 2019-10-05 NOTE — Assessment & Plan Note (Signed)
Clinically significant macular edema OD, controlled but still active at 10-week interval post injection intravitreal Eylea.  We will repeat injection today.

## 2019-10-05 NOTE — Progress Notes (Signed)
10/05/2019     CHIEF COMPLAINT Patient presents for Retina Follow Up   HISTORY OF PRESENT ILLNESS: [name] is a 64 y.o. male who presents to the clinic today for:   HPI    Retina Follow Up    Patient presents with  Diabetic Retinopathy.  In right eye.  This started 10 weeks ago.  Severity is moderate.  Duration of 10 weeks.  Since onset it is stable.          Comments    10 Week Diabetic F/U OD, poss Eylea OD  Pt denies noticeable changes to [place] OU since last visit. Pt denies ocular pain, flashes of light, or floaters OU.         Last edited by Ileana Roup, COA on 10/05/2019  8:17 AM. (History)      Referring physician: Juliette Alcide, MD [address] [place],  [place] [address]  HISTORICAL INFORMATION:   Selected notes from the MEDICAL RECORD NUMBER    Lab Results  Component Value Date   HGBA1C (H) 02/19/2007    8.5 (NOTE)   The ADA recommends the following therapeutic goals for glycemic   control related to Hgb A1C measurement:   Goal of Therapy:   < 7.0% Hgb A1C   Action Suggested:  > 8.0% Hgb A1C   Ref:  Diabetes Care, 22, Suppl. 1, 1999     CURRENT MEDICATIONS: No current outpatient medications on file. (Ophthalmic Drugs)   No current facility-administered medications for this visit. (Ophthalmic Drugs)   Current Outpatient Medications (Other)  Medication Sig   aspirin 81 MG tablet Take 81 mg by mouth daily.   b complex vitamins tablet Take 1 tablet by mouth daily.   Calcium 500-100 MG-UNIT CHEW Chew 1 tablet by mouth 4 (four) times daily.   empagliflozin (JARDIANCE) 25 MG TABS tablet Take 25 mg by mouth daily.   ergocalciferol (VITAMIN D2) 50000 units capsule Take 50,000 Units by mouth once a week.   ferrous sulfate 325 (65 FE) MG EC tablet Take 325 mg by mouth daily.   Icosapent Ethyl 1 g CAPS Take 2 capsules twice a day for triglycerides   Magnesium 250 MG TABS Take by mouth 2 (two) times daily.   metFORMIN (GLUCOPHAGE) 850 MG tablet Take  850 mg by mouth 2 (two) times daily with a meal.   metoprolol succinate (TOPROL-XL) 25 MG 24 hr tablet TAKE 1 TABLET BY MOUTH  DAILY   Multiple Vitamin (MULTIVITAMIN) tablet Take 1 tablet by mouth 2 (two) times daily.   potassium chloride SA (K-DUR,KLOR-CON) 20 MEQ tablet Take 20 mEq by mouth 2 (two) times daily.    ramipril (ALTACE) 2.5 MG capsule TAKE 1 CAPSULE BY MOUTH  DAILY   rosuvastatin (CRESTOR) 20 MG tablet Take 1 tablet (20 mg total) by mouth daily.   vitamin B-12 (CYANOCOBALAMIN) 1000 MCG tablet Take 1,000 mcg by mouth daily.   No current facility-administered medications for this visit. (Other)      REVIEW OF SYSTEMS:    ALLERGIES No Known Allergies  PAST MEDICAL HISTORY Past Medical History:  Diagnosis Date   Coronary artery disease    stents placed 2003   Diabetes mellitus without complication (HCC)    Hyperlipidemia    Hypertension    Sleep apnea    on CPAP   Past Surgical History:  Procedure Laterality Date   BARIATRIC SURGERY  12/2015   BICEPS TENDON REPAIR     CARDIAC CATHETERIZATION  KNEE ARTHROSCOPY Bilateral    SHOULDER ARTHROSCOPY Bilateral    TONSILLECTOMY     UVULLA REMOVAL      FAMILY HISTORY Family History  Problem Relation Age of Onset   Diabetes Mother    Diabetes Father    Heart attack Father     SOCIAL HISTORY Social History   Tobacco Use   Smoking status: Never Smoker   Smokeless tobacco: Never Used  Vaping Use   Vaping Use: Never used  Substance Use Topics   Alcohol use: Not Currently    Alcohol/week: 0.0 standard drinks   Drug use: Never         OPHTHALMIC EXAM:  Base Eye Exam    Visual Acuity (ETDRS)      Right Left   Dist [place] 20/30 -1 20/100 near eye -3   Dist ph [place] 20/25 +2 20/25 +1       Tonometry (Tonopen, 8:21 AM)      Right Left   Pressure 10 10       Pupils      Pupils Dark Light Shape React APD   Right PERRL 4 3 Round Brisk None   Left PERRL 4 3 Round Brisk None        Visual Fields (Counting fingers)      Left Right    Full Full       Extraocular Movement      Right Left    Full Full       Neuro/Psych    Oriented x3: Yes   Mood/Affect: Normal       Dilation    Right eye: 1.0% Mydriacyl, 2.5% Phenylephrine @ 8:21 AM        Slit Lamp and Fundus Exam    External Exam      Right Left   External Normal Normal       Slit Lamp Exam      Right Left   Lids/Lashes Normal Normal   Conjunctiva/Sclera White and quiet White and quiet   Cornea Clear Clear   Anterior Chamber Deep and quiet Deep and quiet   Iris Round and reactive Round and reactive   Lens Posterior chamber intraocular lens Posterior chamber intraocular lens   Anterior Vitreous Normal Normal       Fundus Exam      Right Left   Posterior Vitreous Normal    Disc Normal    C/D Ratio 0.65    Macula Mild clinically significant macular edema, Microaneurysms    Vessels Severe NPDR    Periphery Normal           IMAGING AND PROCEDURES  Imaging and Procedures for 10/05/19  OCT, Retina - OU - Both Eyes       Right Eye Quality was good. Central Foveal Thickness: 274. Progression has worsened.   Left Eye Quality was good. Scan locations included subfoveal. Central Foveal Thickness: 278. Progression has improved.   Notes CSME, temporal fovea OD, active.  Will repeat intravitreal OD, currently at 10-week interval       Intravitreal Injection, Pharmacologic Agent - OD - Right Eye       Time Out 10/05/2019. 9:08 AM. Confirmed correct patient, procedure, site, and patient consented.   Anesthesia Topical anesthesia was used. Anesthetic medications included Akten 3.5%.   Procedure Preparation included Tobramycin 0.3%, Ofloxacin , 10% betadine to eyelids, 5% betadine to ocular surface. A 30 gauge needle was used.   Injection:  2 mg aflibercept (EYLEA) SOLN  NDC: [id number], Lot: [id number]   Route: Intravitreal, Site: Right Eye, Waste: 0 mg  Post-op Post  injection exam found visual acuity of at least counting fingers. The patient tolerated the procedure well. There were no complications. The patient received written and verbal post procedure care education. Post injection medications were not given.                 ASSESSMENT/PLAN:  Severe nonproliferative diabetic retinopathy of right eye, with macular edema, associated with type 2 diabetes mellitus (HCC) Clinically significant macular edema OD, controlled but still active at 10-week interval post injection intravitreal Eylea.  We will repeat injection today.      ICD-10-CM   1. Severe nonproliferative diabetic retinopathy of right eye, with macular edema, associated with type 2 diabetes mellitus (HCC)  E11.3411 OCT, Retina - OU - Both Eyes    Intravitreal Injection, Pharmacologic Agent - OD - Right Eye    aflibercept (EYLEA) SOLN 2 mg    1.CSME, temporal fovea OD, active.  Will repeat intravitreal OD, currently at 10-week interval 2.  3.  Ophthalmic Meds Ordered this visit:  Meds ordered this encounter  Medications   aflibercept (EYLEA) SOLN 2 mg       Return in about 10 weeks (around 12/14/2019) for dilate, OD, EYLEA OCT.  There are no Patient Instructions on file for this visit.   Explained the diagnoses, plan, and follow up with the patient and they expressed understanding.  Patient expressed understanding of the importance of proper follow up care.   Alford Highland Quinette Hentges M.D. Diseases & Surgery of the Retina and Vitreous [facility] 10/05/19     Abbreviations: M myopia (nearsighted); A astigmatism; H hyperopia (farsighted); P presbyopia; Mrx spectacle prescription;  CTL contact lenses; OD right eye; OS left eye; OU both eyes  XT exotropia; ET esotropia; PEK punctate epithelial keratitis; PEE punctate epithelial erosions; DES dry eye syndrome; MGD meibomian gland dysfunction; ATs artificial tears; PFAT's preservative free artificial tears; NSC  nuclear sclerotic cataract; PSC posterior subcapsular cataract; ERM epi-retinal membrane; PVD posterior vitreous detachment; RD retinal detachment; DM diabetes mellitus; DR diabetic retinopathy; NPDR non-proliferative diabetic retinopathy; PDR proliferative diabetic retinopathy; CSME clinically significant macular edema; DME diabetic macular edema; dbh dot blot hemorrhages; CWS cotton wool spot; POAG primary open angle glaucoma; C/D cup-to-disc ratio; HVF humphrey visual field; GVF goldmann visual field; OCT optical coherence tomography; IOP intraocular pressure; BRVO Branch retinal vein occlusion; CRVO central retinal vein occlusion; CRAO central retinal artery occlusion; BRAO branch retinal artery occlusion; RT retinal tear; SB scleral buckle; PPV pars plana vitrectomy; VH Vitreous hemorrhage; PRP panretinal laser photocoagulation; IVK intravitreal kenalog; VMT vitreomacular traction; MH Macular hole;  NVD neovascularization of the disc; NVE neovascularization elsewhere; AREDS age related eye disease study; ARMD age related macular degeneration; POAG primary open angle glaucoma; EBMD epithelial/anterior basement membrane dystrophy; ACIOL anterior chamber intraocular lens; IOL intraocular lens; PCIOL posterior chamber intraocular lens; Phaco/IOL phacoemulsification with intraocular lens placement; PRK photorefractive keratectomy; LASIK laser assisted in situ keratomileusis; HTN hypertension; DM diabetes mellitus; COPD chronic obstructive pulmonary disease

## 2019-10-09 DIAGNOSIS — M9901 Segmental and somatic dysfunction of cervical region: Secondary | ICD-10-CM | POA: Diagnosis not present

## 2019-10-09 DIAGNOSIS — M9902 Segmental and somatic dysfunction of thoracic region: Secondary | ICD-10-CM | POA: Diagnosis not present

## 2019-10-09 DIAGNOSIS — M9903 Segmental and somatic dysfunction of lumbar region: Secondary | ICD-10-CM | POA: Diagnosis not present

## 2019-10-09 DIAGNOSIS — S233XXA Sprain of ligaments of thoracic spine, initial encounter: Secondary | ICD-10-CM | POA: Diagnosis not present

## 2019-10-09 DIAGNOSIS — M47816 Spondylosis without myelopathy or radiculopathy, lumbar region: Secondary | ICD-10-CM | POA: Diagnosis not present

## 2019-10-09 DIAGNOSIS — S134XXA Sprain of ligaments of cervical spine, initial encounter: Secondary | ICD-10-CM | POA: Diagnosis not present

## 2019-10-19 DIAGNOSIS — H43813 Vitreous degeneration, bilateral: Secondary | ICD-10-CM | POA: Diagnosis not present

## 2019-10-19 DIAGNOSIS — E083213 Diabetes mellitus due to underlying condition with mild nonproliferative diabetic retinopathy with macular edema, bilateral: Secondary | ICD-10-CM | POA: Diagnosis not present

## 2019-10-19 DIAGNOSIS — H524 Presbyopia: Secondary | ICD-10-CM | POA: Diagnosis not present

## 2019-10-19 DIAGNOSIS — H16223 Keratoconjunctivitis sicca, not specified as Sjogren's, bilateral: Secondary | ICD-10-CM | POA: Diagnosis not present

## 2019-10-19 DIAGNOSIS — H3540 Unspecified peripheral retinal degeneration: Secondary | ICD-10-CM | POA: Diagnosis not present

## 2019-10-19 DIAGNOSIS — H04123 Dry eye syndrome of bilateral lacrimal glands: Secondary | ICD-10-CM | POA: Diagnosis not present

## 2019-10-22 DIAGNOSIS — S233XXA Sprain of ligaments of thoracic spine, initial encounter: Secondary | ICD-10-CM | POA: Diagnosis not present

## 2019-10-22 DIAGNOSIS — S134XXA Sprain of ligaments of cervical spine, initial encounter: Secondary | ICD-10-CM | POA: Diagnosis not present

## 2019-10-22 DIAGNOSIS — M9901 Segmental and somatic dysfunction of cervical region: Secondary | ICD-10-CM | POA: Diagnosis not present

## 2019-10-22 DIAGNOSIS — M9902 Segmental and somatic dysfunction of thoracic region: Secondary | ICD-10-CM | POA: Diagnosis not present

## 2019-10-22 DIAGNOSIS — M47816 Spondylosis without myelopathy or radiculopathy, lumbar region: Secondary | ICD-10-CM | POA: Diagnosis not present

## 2019-10-22 DIAGNOSIS — M9903 Segmental and somatic dysfunction of lumbar region: Secondary | ICD-10-CM | POA: Diagnosis not present

## 2019-11-05 DIAGNOSIS — M47816 Spondylosis without myelopathy or radiculopathy, lumbar region: Secondary | ICD-10-CM | POA: Diagnosis not present

## 2019-11-05 DIAGNOSIS — M9902 Segmental and somatic dysfunction of thoracic region: Secondary | ICD-10-CM | POA: Diagnosis not present

## 2019-11-05 DIAGNOSIS — S134XXA Sprain of ligaments of cervical spine, initial encounter: Secondary | ICD-10-CM | POA: Diagnosis not present

## 2019-11-05 DIAGNOSIS — M9903 Segmental and somatic dysfunction of lumbar region: Secondary | ICD-10-CM | POA: Diagnosis not present

## 2019-11-05 DIAGNOSIS — M9901 Segmental and somatic dysfunction of cervical region: Secondary | ICD-10-CM | POA: Diagnosis not present

## 2019-11-05 DIAGNOSIS — S233XXA Sprain of ligaments of thoracic spine, initial encounter: Secondary | ICD-10-CM | POA: Diagnosis not present

## 2019-11-19 DIAGNOSIS — S134XXA Sprain of ligaments of cervical spine, initial encounter: Secondary | ICD-10-CM | POA: Diagnosis not present

## 2019-11-19 DIAGNOSIS — M9901 Segmental and somatic dysfunction of cervical region: Secondary | ICD-10-CM | POA: Diagnosis not present

## 2019-11-19 DIAGNOSIS — S233XXA Sprain of ligaments of thoracic spine, initial encounter: Secondary | ICD-10-CM | POA: Diagnosis not present

## 2019-11-19 DIAGNOSIS — M47816 Spondylosis without myelopathy or radiculopathy, lumbar region: Secondary | ICD-10-CM | POA: Diagnosis not present

## 2019-11-19 DIAGNOSIS — M9903 Segmental and somatic dysfunction of lumbar region: Secondary | ICD-10-CM | POA: Diagnosis not present

## 2019-11-19 DIAGNOSIS — M9902 Segmental and somatic dysfunction of thoracic region: Secondary | ICD-10-CM | POA: Diagnosis not present

## 2019-12-03 DIAGNOSIS — M9903 Segmental and somatic dysfunction of lumbar region: Secondary | ICD-10-CM | POA: Diagnosis not present

## 2019-12-03 DIAGNOSIS — S134XXA Sprain of ligaments of cervical spine, initial encounter: Secondary | ICD-10-CM | POA: Diagnosis not present

## 2019-12-03 DIAGNOSIS — S233XXA Sprain of ligaments of thoracic spine, initial encounter: Secondary | ICD-10-CM | POA: Diagnosis not present

## 2019-12-03 DIAGNOSIS — M9901 Segmental and somatic dysfunction of cervical region: Secondary | ICD-10-CM | POA: Diagnosis not present

## 2019-12-03 DIAGNOSIS — M9902 Segmental and somatic dysfunction of thoracic region: Secondary | ICD-10-CM | POA: Diagnosis not present

## 2019-12-03 DIAGNOSIS — M47816 Spondylosis without myelopathy or radiculopathy, lumbar region: Secondary | ICD-10-CM | POA: Diagnosis not present

## 2019-12-14 ENCOUNTER — Ambulatory Visit (INDEPENDENT_AMBULATORY_CARE_PROVIDER_SITE_OTHER): Payer: PPO | Admitting: Ophthalmology

## 2019-12-14 ENCOUNTER — Other Ambulatory Visit: Payer: Self-pay

## 2019-12-14 ENCOUNTER — Encounter (INDEPENDENT_AMBULATORY_CARE_PROVIDER_SITE_OTHER): Payer: Self-pay | Admitting: Ophthalmology

## 2019-12-14 DIAGNOSIS — E113411 Type 2 diabetes mellitus with severe nonproliferative diabetic retinopathy with macular edema, right eye: Secondary | ICD-10-CM

## 2019-12-14 MED ORDER — AFLIBERCEPT 2MG/0.05ML IZ SOLN FOR KALEIDOSCOPE
2.0000 mg | INTRAVITREAL | Status: AC | PRN
  Administered 2019-12-14: 2 mg via INTRAVITREAL

## 2019-12-14 NOTE — Progress Notes (Signed)
12/14/2019     CHIEF COMPLAINT Patient presents for Retina Follow Up   HISTORY OF PRESENT ILLNESS: [name] is a 64 y.o. male who presents to the clinic today for:   HPI    Retina Follow Up    Patient presents with  Diabetic Retinopathy.  In right eye.  This started 10 weeks ago.  Severity is mild.  Duration of 10 weeks.  Since onset it is stable.          Comments    10 Week Diabetic F/U OD, poss Eylea OD  Pt denies noticeable changes to [place] OU since last visit. Pt denies ocular pain, flashes of light, or floaters OU.  LBS: 116 this AM A1c: 4.8, 09/2019       Last edited by Ileana Roup, COA on 12/14/2019  8:16 AM. (History)      Referring physician: Juliette Alcide, MD [address] [place],  [place] [address]  HISTORICAL INFORMATION:   Selected notes from the MEDICAL RECORD NUMBER    Lab Results  Component Value Date   HGBA1C (H) 02/19/2007    8.5 (NOTE)   The ADA recommends the following therapeutic goals for glycemic   control related to Hgb A1C measurement:   Goal of Therapy:   < 7.0% Hgb A1C   Action Suggested:  > 8.0% Hgb A1C   Ref:  Diabetes Care, 22, Suppl. 1, 1999     CURRENT MEDICATIONS: No current outpatient medications on file. (Ophthalmic Drugs)   No current facility-administered medications for this visit. (Ophthalmic Drugs)   Current Outpatient Medications (Other)  Medication Sig  . aspirin 81 MG tablet Take 81 mg by mouth daily.  Marland Kitchen b complex vitamins tablet Take 1 tablet by mouth daily.  . Calcium 500-100 MG-UNIT CHEW Chew 1 tablet by mouth 4 (four) times daily.  . empagliflozin (JARDIANCE) 25 MG TABS tablet Take 25 mg by mouth daily.  . ergocalciferol (VITAMIN D2) 50000 units capsule Take 50,000 Units by mouth once a week.  . ferrous sulfate 325 (65 FE) MG EC tablet Take 325 mg by mouth daily.  Bess Harvest Ethyl 1 g CAPS Take 2 capsules twice a day for triglycerides  . Magnesium 250 MG TABS Take by mouth 2 (two) times daily.  . metFORMIN  (GLUCOPHAGE) 850 MG tablet Take 850 mg by mouth 2 (two) times daily with a meal.  . metoprolol succinate (TOPROL-XL) 25 MG 24 hr tablet TAKE 1 TABLET BY MOUTH  DAILY  . Multiple Vitamin (MULTIVITAMIN) tablet Take 1 tablet by mouth 2 (two) times daily.  . potassium chloride SA (K-DUR,KLOR-CON) 20 MEQ tablet Take 20 mEq by mouth 2 (two) times daily.   . ramipril (ALTACE) 2.5 MG capsule TAKE 1 CAPSULE BY MOUTH  DAILY  . rosuvastatin (CRESTOR) 20 MG tablet Take 1 tablet (20 mg total) by mouth daily.  . vitamin B-12 (CYANOCOBALAMIN) 1000 MCG tablet Take 1,000 mcg by mouth daily.   No current facility-administered medications for this visit. (Other)      REVIEW OF SYSTEMS:    ALLERGIES No Known Allergies  PAST MEDICAL HISTORY Past Medical History:  Diagnosis Date  . Coronary artery disease    stents placed 2003  . Diabetes mellitus without complication (HCC)   . Hyperlipidemia   . Hypertension   . Sleep apnea    on CPAP   Past Surgical History:  Procedure Laterality Date  . BARIATRIC SURGERY  12/2015  . BICEPS TENDON REPAIR    .  CARDIAC CATHETERIZATION    . KNEE ARTHROSCOPY Bilateral   . SHOULDER ARTHROSCOPY Bilateral   . TONSILLECTOMY    . UVULLA REMOVAL      FAMILY HISTORY Family History  Problem Relation Age of Onset  . Diabetes Mother   . Diabetes Father   . Heart attack Father     SOCIAL HISTORY Social History   Tobacco Use  . Smoking status: Never Smoker  . Smokeless tobacco: Never Used  Vaping Use  . Vaping Use: Never used  Substance Use Topics  . Alcohol use: Not Currently    Alcohol/week: 0.0 standard drinks  . Drug use: Never         OPHTHALMIC EXAM: Base Eye Exam    Visual Acuity (ETDRS)      Right Left   Dist [place] 20/20 -2 20/100 near eye +2   Dist ph [place]  20/20 -1       Tonometry (Tonopen, 8:16 AM)      Right Left   Pressure 10 08       Pupils      Pupils Dark Light Shape React APD   Right PERRL 4 3 Round Brisk None   Left PERRL 4  3 Round Brisk None       Visual Fields (Counting fingers)      Left Right    Full Full       Extraocular Movement      Right Left    Full Full       Neuro/Psych    Oriented x3: Yes   Mood/Affect: Normal       Dilation    Right eye: 1.0% Mydriacyl, 2.5% Phenylephrine @ 8:19 AM        Slit Lamp and Fundus Exam    External Exam      Right Left   External Normal Normal       Slit Lamp Exam      Right Left   Lids/Lashes Normal Normal   Conjunctiva/Sclera White and quiet White and quiet   Cornea Clear Clear   Anterior Chamber Deep and quiet Deep and quiet   Iris Round and reactive Round and reactive   Lens Posterior chamber intraocular lens Posterior chamber intraocular lens   Anterior Vitreous Normal Normal       Fundus Exam      Right Left   Posterior Vitreous Normal    Disc Normal    C/D Ratio 0.65    Macula Mild clinically significant macular edema, Microaneurysms    Vessels Severe NPDR    Periphery Normal           IMAGING AND PROCEDURES  Imaging and Procedures for 12/14/19  OCT, Retina - OU - Both Eyes       Right Eye Quality was good. Scan locations included subfoveal. Central Foveal Thickness: 274. Progression has been stable. Findings include cystoid macular edema.   Left Eye Quality was good. Scan locations included subfoveal. Central Foveal Thickness: 277. Progression has been stable.   Notes CSME OD, temporal to fovea, stable at this current interval 10-week.  We will repeat intravitreal Eylea OD today and examination in 3 months       Intravitreal Injection, Pharmacologic Agent - OD - Right Eye       Time Out 12/14/2019. 9:04 AM. Confirmed correct patient, procedure, site, and patient consented.   Anesthesia Topical anesthesia was used. Anesthetic medications included Akten 3.5%.   Procedure Preparation included Tobramycin 0.3%, 10% betadine to  eyelids, 5% betadine to ocular surface. A 30 gauge needle was used.   Injection:  2  mg aflibercept [name]) SOLN   NDC: L6038910, Lot: [id number]   Route: Intravitreal, Site: Right Eye, Waste: 0 mg  Post-op Post injection exam found visual acuity of at least counting fingers. The patient tolerated the procedure well. There were no complications. The patient received written and verbal post procedure care education. Post injection medications were not given.                 ASSESSMENT/PLAN:  No problem-specific Assessment & Plan notes found for this encounter.      ICD-10-CM   1. Severe nonproliferative diabetic retinopathy of right eye, with macular edema, associated with type 2 diabetes mellitus (HCC)  E11.3411 OCT, Retina - OU - Both Eyes    Intravitreal Injection, Pharmacologic Agent - OD - Right Eye    aflibercept (EYLEA) SOLN 2 mg    1.  Repeat intravitreal Eylea OD today for diabetic CSME.  Goal shall be will be to maintain CSME such that it does not become center involved OD.  2.  3.  Ophthalmic Meds Ordered this visit:  Meds ordered this encounter  Medications  . aflibercept (EYLEA) SOLN 2 mg       Return in about 3 months (around 03/15/2020) for DILATE OU, dilate, OD, EYLEA OCT.  Patient Instructions  Patient to report promptly new onset visual acuity decline or distortions    Explained the diagnoses, plan, and follow up with the patient and they expressed understanding.  Patient expressed understanding of the importance of proper follow up care.   Alford Highland Lemont Sitzmann M.D. Diseases & Surgery of the Retina and Vitreous [facility] 12/14/19     Abbreviations: M myopia (nearsighted); A astigmatism; H hyperopia (farsighted); P presbyopia; Mrx spectacle prescription;  CTL contact lenses; OD right eye; OS left eye; OU both eyes  XT exotropia; ET esotropia; PEK punctate epithelial keratitis; PEE punctate epithelial erosions; DES dry eye syndrome; MGD meibomian gland dysfunction; ATs artificial tears; PFAT's preservative free  artificial tears; NSC nuclear sclerotic cataract; PSC posterior subcapsular cataract; ERM epi-retinal membrane; PVD posterior vitreous detachment; RD retinal detachment; DM diabetes mellitus; DR diabetic retinopathy; NPDR non-proliferative diabetic retinopathy; PDR proliferative diabetic retinopathy; CSME clinically significant macular edema; DME diabetic macular edema; dbh dot blot hemorrhages; CWS cotton wool spot; POAG primary open angle glaucoma; C/D cup-to-disc ratio; HVF humphrey visual field; GVF goldmann visual field; OCT optical coherence tomography; IOP intraocular pressure; BRVO Branch retinal vein occlusion; CRVO central retinal vein occlusion; CRAO central retinal artery occlusion; BRAO branch retinal artery occlusion; RT retinal tear; SB scleral buckle; PPV pars plana vitrectomy; VH Vitreous hemorrhage; PRP panretinal laser photocoagulation; IVK intravitreal kenalog; VMT vitreomacular traction; MH Macular hole;  NVD neovascularization of the disc; NVE neovascularization elsewhere; AREDS age related eye disease study; ARMD age related macular degeneration; POAG primary open angle glaucoma; EBMD epithelial/anterior basement membrane dystrophy; ACIOL anterior chamber intraocular lens; IOL intraocular lens; PCIOL posterior chamber intraocular lens; Phaco/IOL phacoemulsification with intraocular lens placement; PRK photorefractive keratectomy; LASIK laser assisted in situ keratomileusis; HTN hypertension; DM diabetes mellitus; COPD chronic obstructive pulmonary disease

## 2019-12-14 NOTE — Patient Instructions (Signed)
Patient to report promptly new onset visual acuity decline or distortions

## 2019-12-17 DIAGNOSIS — S233XXA Sprain of ligaments of thoracic spine, initial encounter: Secondary | ICD-10-CM | POA: Diagnosis not present

## 2019-12-17 DIAGNOSIS — M9902 Segmental and somatic dysfunction of thoracic region: Secondary | ICD-10-CM | POA: Diagnosis not present

## 2019-12-17 DIAGNOSIS — M9901 Segmental and somatic dysfunction of cervical region: Secondary | ICD-10-CM | POA: Diagnosis not present

## 2019-12-17 DIAGNOSIS — M9903 Segmental and somatic dysfunction of lumbar region: Secondary | ICD-10-CM | POA: Diagnosis not present

## 2019-12-17 DIAGNOSIS — S134XXA Sprain of ligaments of cervical spine, initial encounter: Secondary | ICD-10-CM | POA: Diagnosis not present

## 2019-12-17 DIAGNOSIS — M47816 Spondylosis without myelopathy or radiculopathy, lumbar region: Secondary | ICD-10-CM | POA: Diagnosis not present

## 2019-12-24 DIAGNOSIS — E782 Mixed hyperlipidemia: Secondary | ICD-10-CM | POA: Diagnosis not present

## 2019-12-24 DIAGNOSIS — E876 Hypokalemia: Secondary | ICD-10-CM | POA: Diagnosis not present

## 2019-12-24 DIAGNOSIS — E559 Vitamin D deficiency, unspecified: Secondary | ICD-10-CM | POA: Diagnosis not present

## 2019-12-24 DIAGNOSIS — E538 Deficiency of other specified B group vitamins: Secondary | ICD-10-CM | POA: Diagnosis not present

## 2019-12-24 DIAGNOSIS — E1159 Type 2 diabetes mellitus with other circulatory complications: Secondary | ICD-10-CM | POA: Diagnosis not present

## 2019-12-24 DIAGNOSIS — Z9884 Bariatric surgery status: Secondary | ICD-10-CM | POA: Diagnosis not present

## 2019-12-24 DIAGNOSIS — I10 Essential (primary) hypertension: Secondary | ICD-10-CM | POA: Diagnosis not present

## 2019-12-30 DIAGNOSIS — E1159 Type 2 diabetes mellitus with other circulatory complications: Secondary | ICD-10-CM | POA: Diagnosis not present

## 2019-12-30 DIAGNOSIS — E559 Vitamin D deficiency, unspecified: Secondary | ICD-10-CM | POA: Diagnosis not present

## 2019-12-30 DIAGNOSIS — I10 Essential (primary) hypertension: Secondary | ICD-10-CM | POA: Diagnosis not present

## 2019-12-30 DIAGNOSIS — E782 Mixed hyperlipidemia: Secondary | ICD-10-CM | POA: Diagnosis not present

## 2019-12-30 DIAGNOSIS — I2510 Atherosclerotic heart disease of native coronary artery without angina pectoris: Secondary | ICD-10-CM | POA: Diagnosis not present

## 2019-12-30 DIAGNOSIS — E1142 Type 2 diabetes mellitus with diabetic polyneuropathy: Secondary | ICD-10-CM | POA: Diagnosis not present

## 2019-12-30 DIAGNOSIS — Z9884 Bariatric surgery status: Secondary | ICD-10-CM | POA: Diagnosis not present

## 2019-12-30 DIAGNOSIS — E538 Deficiency of other specified B group vitamins: Secondary | ICD-10-CM | POA: Diagnosis not present

## 2019-12-30 DIAGNOSIS — D508 Other iron deficiency anemias: Secondary | ICD-10-CM | POA: Diagnosis not present

## 2019-12-30 DIAGNOSIS — E876 Hypokalemia: Secondary | ICD-10-CM | POA: Diagnosis not present

## 2019-12-30 DIAGNOSIS — E113313 Type 2 diabetes mellitus with moderate nonproliferative diabetic retinopathy with macular edema, bilateral: Secondary | ICD-10-CM | POA: Diagnosis not present

## 2019-12-31 DIAGNOSIS — M9902 Segmental and somatic dysfunction of thoracic region: Secondary | ICD-10-CM | POA: Diagnosis not present

## 2019-12-31 DIAGNOSIS — M47816 Spondylosis without myelopathy or radiculopathy, lumbar region: Secondary | ICD-10-CM | POA: Diagnosis not present

## 2019-12-31 DIAGNOSIS — S134XXA Sprain of ligaments of cervical spine, initial encounter: Secondary | ICD-10-CM | POA: Diagnosis not present

## 2019-12-31 DIAGNOSIS — M9903 Segmental and somatic dysfunction of lumbar region: Secondary | ICD-10-CM | POA: Diagnosis not present

## 2019-12-31 DIAGNOSIS — M9901 Segmental and somatic dysfunction of cervical region: Secondary | ICD-10-CM | POA: Diagnosis not present

## 2019-12-31 DIAGNOSIS — S233XXA Sprain of ligaments of thoracic spine, initial encounter: Secondary | ICD-10-CM | POA: Diagnosis not present

## 2020-01-14 DIAGNOSIS — S134XXA Sprain of ligaments of cervical spine, initial encounter: Secondary | ICD-10-CM | POA: Diagnosis not present

## 2020-01-14 DIAGNOSIS — M9903 Segmental and somatic dysfunction of lumbar region: Secondary | ICD-10-CM | POA: Diagnosis not present

## 2020-01-14 DIAGNOSIS — M9901 Segmental and somatic dysfunction of cervical region: Secondary | ICD-10-CM | POA: Diagnosis not present

## 2020-01-14 DIAGNOSIS — M47816 Spondylosis without myelopathy or radiculopathy, lumbar region: Secondary | ICD-10-CM | POA: Diagnosis not present

## 2020-01-14 DIAGNOSIS — S233XXA Sprain of ligaments of thoracic spine, initial encounter: Secondary | ICD-10-CM | POA: Diagnosis not present

## 2020-01-14 DIAGNOSIS — M9902 Segmental and somatic dysfunction of thoracic region: Secondary | ICD-10-CM | POA: Diagnosis not present

## 2020-01-28 DIAGNOSIS — S134XXA Sprain of ligaments of cervical spine, initial encounter: Secondary | ICD-10-CM | POA: Diagnosis not present

## 2020-01-28 DIAGNOSIS — M9902 Segmental and somatic dysfunction of thoracic region: Secondary | ICD-10-CM | POA: Diagnosis not present

## 2020-01-28 DIAGNOSIS — M9901 Segmental and somatic dysfunction of cervical region: Secondary | ICD-10-CM | POA: Diagnosis not present

## 2020-01-28 DIAGNOSIS — M9903 Segmental and somatic dysfunction of lumbar region: Secondary | ICD-10-CM | POA: Diagnosis not present

## 2020-01-28 DIAGNOSIS — M47816 Spondylosis without myelopathy or radiculopathy, lumbar region: Secondary | ICD-10-CM | POA: Diagnosis not present

## 2020-01-28 DIAGNOSIS — S233XXA Sprain of ligaments of thoracic spine, initial encounter: Secondary | ICD-10-CM | POA: Diagnosis not present

## 2020-02-01 ENCOUNTER — Other Ambulatory Visit: Payer: Self-pay | Admitting: Cardiology

## 2020-02-11 DIAGNOSIS — M47816 Spondylosis without myelopathy or radiculopathy, lumbar region: Secondary | ICD-10-CM | POA: Diagnosis not present

## 2020-02-11 DIAGNOSIS — M9903 Segmental and somatic dysfunction of lumbar region: Secondary | ICD-10-CM | POA: Diagnosis not present

## 2020-02-11 DIAGNOSIS — S134XXA Sprain of ligaments of cervical spine, initial encounter: Secondary | ICD-10-CM | POA: Diagnosis not present

## 2020-02-11 DIAGNOSIS — S233XXA Sprain of ligaments of thoracic spine, initial encounter: Secondary | ICD-10-CM | POA: Diagnosis not present

## 2020-02-11 DIAGNOSIS — M9902 Segmental and somatic dysfunction of thoracic region: Secondary | ICD-10-CM | POA: Diagnosis not present

## 2020-02-11 DIAGNOSIS — M9901 Segmental and somatic dysfunction of cervical region: Secondary | ICD-10-CM | POA: Diagnosis not present

## 2020-02-25 DIAGNOSIS — M9901 Segmental and somatic dysfunction of cervical region: Secondary | ICD-10-CM | POA: Diagnosis not present

## 2020-02-25 DIAGNOSIS — S233XXA Sprain of ligaments of thoracic spine, initial encounter: Secondary | ICD-10-CM | POA: Diagnosis not present

## 2020-02-25 DIAGNOSIS — S134XXA Sprain of ligaments of cervical spine, initial encounter: Secondary | ICD-10-CM | POA: Diagnosis not present

## 2020-02-25 DIAGNOSIS — M9902 Segmental and somatic dysfunction of thoracic region: Secondary | ICD-10-CM | POA: Diagnosis not present

## 2020-02-25 DIAGNOSIS — M47816 Spondylosis without myelopathy or radiculopathy, lumbar region: Secondary | ICD-10-CM | POA: Diagnosis not present

## 2020-02-25 DIAGNOSIS — M9903 Segmental and somatic dysfunction of lumbar region: Secondary | ICD-10-CM | POA: Diagnosis not present

## 2020-03-10 DIAGNOSIS — M9901 Segmental and somatic dysfunction of cervical region: Secondary | ICD-10-CM | POA: Diagnosis not present

## 2020-03-10 DIAGNOSIS — S233XXA Sprain of ligaments of thoracic spine, initial encounter: Secondary | ICD-10-CM | POA: Diagnosis not present

## 2020-03-10 DIAGNOSIS — M47816 Spondylosis without myelopathy or radiculopathy, lumbar region: Secondary | ICD-10-CM | POA: Diagnosis not present

## 2020-03-10 DIAGNOSIS — M9903 Segmental and somatic dysfunction of lumbar region: Secondary | ICD-10-CM | POA: Diagnosis not present

## 2020-03-10 DIAGNOSIS — S134XXA Sprain of ligaments of cervical spine, initial encounter: Secondary | ICD-10-CM | POA: Diagnosis not present

## 2020-03-10 DIAGNOSIS — M9902 Segmental and somatic dysfunction of thoracic region: Secondary | ICD-10-CM | POA: Diagnosis not present

## 2020-03-15 ENCOUNTER — Encounter (INDEPENDENT_AMBULATORY_CARE_PROVIDER_SITE_OTHER): Payer: Self-pay | Admitting: Ophthalmology

## 2020-03-15 ENCOUNTER — Ambulatory Visit (INDEPENDENT_AMBULATORY_CARE_PROVIDER_SITE_OTHER): Payer: PPO | Admitting: Ophthalmology

## 2020-03-15 ENCOUNTER — Other Ambulatory Visit: Payer: Self-pay

## 2020-03-15 DIAGNOSIS — E113492 Type 2 diabetes mellitus with severe nonproliferative diabetic retinopathy without macular edema, left eye: Secondary | ICD-10-CM

## 2020-03-15 DIAGNOSIS — E113411 Type 2 diabetes mellitus with severe nonproliferative diabetic retinopathy with macular edema, right eye: Secondary | ICD-10-CM

## 2020-03-15 MED ORDER — AFLIBERCEPT 2MG/0.05ML IZ SOLN FOR KALEIDOSCOPE
2.0000 mg | INTRAVITREAL | Status: AC | PRN
  Administered 2020-03-15: 2 mg via INTRAVITREAL

## 2020-03-15 NOTE — Assessment & Plan Note (Signed)
OS stable by OCT will observe

## 2020-03-15 NOTE — Progress Notes (Signed)
03/15/2020     CHIEF COMPLAINT Patient presents for Retina Follow Up (3 Month F/U OU, poss Eylea OD//Pt c/o sharp pains that come and go OU. Stable [place] OU.//A1c: 4.9, 01/2020/LBS: 150 this AM)   HISTORY OF PRESENT ILLNESS: [name] is a 65 y.o. male who presents to the clinic today for:   HPI    Retina Follow Up    Patient presents with  Diabetic Retinopathy.  In both eyes.  This started 3 months ago.  Severity is mild.  Duration of 3 months.  Since onset it is stable. Additional comments: 3 Month F/U OU, poss Eylea OD  Pt c/o sharp pains that come and go OU. Stable [place] OU.  A1c: 4.9, 01/2020 LBS: 150 this AM       Last edited by Ileana Roup, COA on 03/15/2020  8:23 AM. (History)      Referring physician: Juliette Alcide, MD [address] [place],  [place] [address]  HISTORICAL INFORMATION:   Selected notes from the MEDICAL RECORD NUMBER    Lab Results  Component Value Date   HGBA1C (H) 02/19/2007    8.5 (NOTE)   The ADA recommends the following therapeutic goals for glycemic   control related to Hgb A1C measurement:   Goal of Therapy:   < 7.0% Hgb A1C   Action Suggested:  > 8.0% Hgb A1C   Ref:  Diabetes Care, 22, Suppl. 1, 1999     CURRENT MEDICATIONS: No current outpatient medications on file. (Ophthalmic Drugs)   No current facility-administered medications for this visit. (Ophthalmic Drugs)   Current Outpatient Medications (Other)  Medication Sig  . aspirin 81 MG tablet Take 81 mg by mouth daily.  Marland Kitchen b complex vitamins tablet Take 1 tablet by mouth daily.  . Calcium 500-100 MG-UNIT CHEW Chew 1 tablet by mouth 4 (four) times daily.  . empagliflozin (JARDIANCE) 25 MG TABS tablet Take 25 mg by mouth daily.  . ergocalciferol (VITAMIN D2) 50000 units capsule Take 50,000 Units by mouth once a week.  . ferrous sulfate 325 (65 FE) MG EC tablet Take 325 mg by mouth daily.  Bess Harvest Ethyl 1 g CAPS Take 2 capsules twice a day for triglycerides  . Magnesium 250 MG TABS  Take by mouth 2 (two) times daily.  . metFORMIN (GLUCOPHAGE) 850 MG tablet Take 850 mg by mouth 2 (two) times daily with a meal.  . metoprolol succinate (TOPROL-XL) 25 MG 24 hr tablet TAKE 1 TABLET BY MOUTH  DAILY  . Multiple Vitamin (MULTIVITAMIN) tablet Take 1 tablet by mouth 2 (two) times daily.  . potassium chloride SA (K-DUR,KLOR-CON) 20 MEQ tablet Take 20 mEq by mouth 2 (two) times daily.   . ramipril (ALTACE) 2.5 MG capsule TAKE 1 CAPSULE BY MOUTH  DAILY  . rosuvastatin (CRESTOR) 20 MG tablet TAKE 1 TABLET(20 MG) BY MOUTH DAILY  . vitamin B-12 (CYANOCOBALAMIN) 1000 MCG tablet Take 1,000 mcg by mouth daily.   No current facility-administered medications for this visit. (Other)      REVIEW OF SYSTEMS:    ALLERGIES No Known Allergies  PAST MEDICAL HISTORY Past Medical History:  Diagnosis Date  . Coronary artery disease    stents placed 2003  . Diabetes mellitus without complication (HCC)   . Hyperlipidemia   . Hypertension   . Sleep apnea    on CPAP   Past Surgical History:  Procedure Laterality Date  . BARIATRIC SURGERY  12/2015  . BICEPS TENDON REPAIR    .  CARDIAC CATHETERIZATION    . KNEE ARTHROSCOPY Bilateral   . SHOULDER ARTHROSCOPY Bilateral   . TONSILLECTOMY    . UVULLA REMOVAL      FAMILY HISTORY Family History  Problem Relation Age of Onset  . Diabetes Mother   . Diabetes Father   . Heart attack Father     SOCIAL HISTORY Social History   Tobacco Use  . Smoking status: Never Smoker  . Smokeless tobacco: Never Used  Vaping Use  . Vaping Use: Never used  Substance Use Topics  . Alcohol use: Not Currently    Alcohol/week: 0.0 standard drinks  . Drug use: Never         OPHTHALMIC EXAM: Base Eye Exam    Visual Acuity (ETDRS)      Right Left   Dist [place] 20/25 -2 20/50 +2   Dist ph [place]  20/20 -2       Tonometry (Tonopen, 8:23 AM)      Right Left   Pressure 09 09       Pupils      Pupils Dark Light Shape React APD   Right PERRL 4 3  Round Brisk None   Left PERRL 4 3 Round Brisk None       Visual Fields (Counting fingers)      Left Right    Full Full       Extraocular Movement      Right Left    Full Full       Neuro/Psych    Oriented x3: Yes   Mood/Affect: Normal       Dilation    Both eyes: 1.0% Mydriacyl, 2.5% Phenylephrine @ 8:26 AM        Slit Lamp and Fundus Exam    External Exam      Right Left   External Normal Normal       Slit Lamp Exam      Right Left   Lids/Lashes Normal Normal   Conjunctiva/Sclera White and quiet White and quiet   Cornea Clear Clear   Anterior Chamber Deep and quiet Deep and quiet   Iris Round and reactive Round and reactive   Lens Posterior chamber intraocular lens Posterior chamber intraocular lens   Anterior Vitreous Normal Normal       Fundus Exam      Right Left   Posterior Vitreous Normal    Disc Normal    C/D Ratio 0.65    Macula Mild clinically significant macular edema, Microaneurysms    Vessels Severe NPDR    Periphery Normal           IMAGING AND PROCEDURES  Imaging and Procedures for 03/15/20  OCT, Retina - OU - Both Eyes       Right Eye Quality was good. Central Foveal Thickness: 277. Progression has improved.   Left Eye Quality was good. Scan locations included subfoveal. Central Foveal Thickness: 282. Progression has been stable.   Notes Region of CSME temporal to the fovea, now 3 months post Eylea vastly improved we will repeat injection Eylea OD today and examined OU in 4 months       Intravitreal Injection, Pharmacologic Agent - OD - Right Eye       Time Out 03/15/2020. 8:57 AM. Confirmed correct patient, procedure, site, and patient consented.   Anesthesia Topical anesthesia was used. Anesthetic medications included Akten 3.5%.   Procedure Preparation included Tobramycin 0.3%, 10% betadine to eyelids, 5% betadine to ocular surface. A 30 gauge  needle was used.   Injection:  2 mg aflibercept Alfonse Flavors) SOLN   NDC:  [id number], Lot: [id number]   Route: Intravitreal, Site: Right Eye, Waste: 0 mg  Post-op Post injection exam found visual acuity of at least counting fingers. The patient tolerated the procedure well. There were no complications. The patient received written and verbal post procedure care education. Post injection medications were not given.                 ASSESSMENT/PLAN:  Severe nonproliferative diabetic retinopathy of right eye, with macular edema, associated with type 2 diabetes mellitus (HCC) Improved CSME overall temporal aspect of fovea, will repeat injection today Eylea at 50-month interval  Dilate OU next at 4 months with no planned injection  Severe nonproliferative diabetic retinopathy of left eye without macular edema associated with type 2 diabetes mellitus ([place]) OS stable by OCT will observe      ICD-10-CM   1. Severe nonproliferative diabetic retinopathy of right eye, with macular edema, associated with type 2 diabetes mellitus (HCC)  E11.3411 OCT, Retina - OU - Both Eyes    Intravitreal Injection, Pharmacologic Agent - OD - Right Eye    aflibercept (EYLEA) SOLN 2 mg  2. Severe nonproliferative diabetic retinopathy of left eye without macular edema associated with type 2 diabetes mellitus (HCC)  Q03.4742 OCT, Retina - OU - Both Eyes    1.  Repeat intravitreal Eylea OD today, at 16-month interval to maintain resolution of CSME which was present 3 months prior  Dilate OU next with no planned injection  2.  3.  Ophthalmic Meds Ordered this visit:  Meds ordered this encounter  Medications  . aflibercept (EYLEA) SOLN 2 mg       Return in about 4 months (around 07/13/2020) for DILATE OU, COLOR FP, OCT.  There are no Patient Instructions on file for this visit.   Explained the diagnoses, plan, and follow up with the patient and they expressed understanding.  Patient expressed understanding of the importance of proper follow up care.   Clent Demark Demarco Bacci  M.D. Diseases & Surgery of the Retina and Vitreous [facility] 03/15/20     Abbreviations: M myopia (nearsighted); A astigmatism; H hyperopia (farsighted); P presbyopia; Mrx spectacle prescription;  CTL contact lenses; OD right eye; OS left eye; OU both eyes  XT exotropia; ET esotropia; PEK punctate epithelial keratitis; PEE punctate epithelial erosions; DES dry eye syndrome; MGD meibomian gland dysfunction; ATs artificial tears; PFAT's preservative free artificial tears; [place] nuclear sclerotic cataract; PSC posterior subcapsular cataract; ERM epi-retinal membrane; PVD posterior vitreous detachment; RD retinal detachment; DM diabetes mellitus; DR diabetic retinopathy; NPDR non-proliferative diabetic retinopathy; PDR proliferative diabetic retinopathy; CSME clinically significant macular edema; DME diabetic macular edema; dbh dot blot hemorrhages; CWS cotton wool spot; POAG primary open angle glaucoma; C/D cup-to-disc ratio; HVF humphrey visual field; GVF goldmann visual field; OCT optical coherence tomography; IOP intraocular pressure; BRVO Branch retinal vein occlusion; CRVO central retinal vein occlusion; CRAO central retinal artery occlusion; BRAO branch retinal artery occlusion; RT retinal tear; SB scleral buckle; PPV pars plana vitrectomy; VH Vitreous hemorrhage; PRP panretinal laser photocoagulation; IVK intravitreal kenalog; VMT vitreomacular traction; MH Macular hole;  NVD neovascularization of the disc; NVE neovascularization elsewhere; AREDS age related eye disease study; ARMD age related macular degeneration; POAG primary open angle glaucoma; EBMD epithelial/anterior basement membrane dystrophy; ACIOL anterior chamber intraocular lens; IOL intraocular lens; PCIOL posterior chamber intraocular lens; Phaco/IOL phacoemulsification with intraocular lens placement; PRK photorefractive  keratectomy; LASIK laser assisted in situ keratomileusis; HTN hypertension; DM diabetes mellitus; COPD  chronic obstructive pulmonary disease

## 2020-03-15 NOTE — Assessment & Plan Note (Signed)
Improved CSME overall temporal aspect of fovea, will repeat injection today Eylea at 78-month interval  Dilate OU next at 4 months with no planned injection

## 2020-03-16 DIAGNOSIS — K760 Fatty (change of) liver, not elsewhere classified: Secondary | ICD-10-CM | POA: Diagnosis not present

## 2020-03-16 DIAGNOSIS — I10 Essential (primary) hypertension: Secondary | ICD-10-CM | POA: Diagnosis not present

## 2020-03-16 DIAGNOSIS — E782 Mixed hyperlipidemia: Secondary | ICD-10-CM | POA: Diagnosis not present

## 2020-03-23 DIAGNOSIS — Z1331 Encounter for screening for depression: Secondary | ICD-10-CM | POA: Diagnosis not present

## 2020-03-23 DIAGNOSIS — E1143 Type 2 diabetes mellitus with diabetic autonomic (poly)neuropathy: Secondary | ICD-10-CM | POA: Diagnosis not present

## 2020-03-23 DIAGNOSIS — I10 Essential (primary) hypertension: Secondary | ICD-10-CM | POA: Diagnosis not present

## 2020-03-23 DIAGNOSIS — K760 Fatty (change of) liver, not elsewhere classified: Secondary | ICD-10-CM | POA: Diagnosis not present

## 2020-03-23 DIAGNOSIS — Z6830 Body mass index (BMI) 30.0-30.9, adult: Secondary | ICD-10-CM | POA: Diagnosis not present

## 2020-03-23 DIAGNOSIS — E782 Mixed hyperlipidemia: Secondary | ICD-10-CM | POA: Diagnosis not present

## 2020-03-23 DIAGNOSIS — E113313 Type 2 diabetes mellitus with moderate nonproliferative diabetic retinopathy with macular edema, bilateral: Secondary | ICD-10-CM | POA: Diagnosis not present

## 2020-03-23 DIAGNOSIS — Z1389 Encounter for screening for other disorder: Secondary | ICD-10-CM | POA: Diagnosis not present

## 2020-03-24 DIAGNOSIS — S134XXA Sprain of ligaments of cervical spine, initial encounter: Secondary | ICD-10-CM | POA: Diagnosis not present

## 2020-03-24 DIAGNOSIS — M47816 Spondylosis without myelopathy or radiculopathy, lumbar region: Secondary | ICD-10-CM | POA: Diagnosis not present

## 2020-03-24 DIAGNOSIS — M9902 Segmental and somatic dysfunction of thoracic region: Secondary | ICD-10-CM | POA: Diagnosis not present

## 2020-03-24 DIAGNOSIS — M9901 Segmental and somatic dysfunction of cervical region: Secondary | ICD-10-CM | POA: Diagnosis not present

## 2020-03-24 DIAGNOSIS — S233XXA Sprain of ligaments of thoracic spine, initial encounter: Secondary | ICD-10-CM | POA: Diagnosis not present

## 2020-03-24 DIAGNOSIS — M9903 Segmental and somatic dysfunction of lumbar region: Secondary | ICD-10-CM | POA: Diagnosis not present

## 2020-04-06 DIAGNOSIS — I10 Essential (primary) hypertension: Secondary | ICD-10-CM | POA: Diagnosis not present

## 2020-04-06 DIAGNOSIS — D509 Iron deficiency anemia, unspecified: Secondary | ICD-10-CM | POA: Diagnosis not present

## 2020-04-06 DIAGNOSIS — Z9884 Bariatric surgery status: Secondary | ICD-10-CM | POA: Diagnosis not present

## 2020-04-06 DIAGNOSIS — E113311 Type 2 diabetes mellitus with moderate nonproliferative diabetic retinopathy with macular edema, right eye: Secondary | ICD-10-CM | POA: Diagnosis not present

## 2020-04-06 DIAGNOSIS — E559 Vitamin D deficiency, unspecified: Secondary | ICD-10-CM | POA: Diagnosis not present

## 2020-04-06 DIAGNOSIS — E1142 Type 2 diabetes mellitus with diabetic polyneuropathy: Secondary | ICD-10-CM | POA: Diagnosis not present

## 2020-04-06 DIAGNOSIS — I2510 Atherosclerotic heart disease of native coronary artery without angina pectoris: Secondary | ICD-10-CM | POA: Diagnosis not present

## 2020-04-06 DIAGNOSIS — E782 Mixed hyperlipidemia: Secondary | ICD-10-CM | POA: Diagnosis not present

## 2020-04-07 DIAGNOSIS — M9901 Segmental and somatic dysfunction of cervical region: Secondary | ICD-10-CM | POA: Diagnosis not present

## 2020-04-07 DIAGNOSIS — S134XXA Sprain of ligaments of cervical spine, initial encounter: Secondary | ICD-10-CM | POA: Diagnosis not present

## 2020-04-07 DIAGNOSIS — M47816 Spondylosis without myelopathy or radiculopathy, lumbar region: Secondary | ICD-10-CM | POA: Diagnosis not present

## 2020-04-07 DIAGNOSIS — M9902 Segmental and somatic dysfunction of thoracic region: Secondary | ICD-10-CM | POA: Diagnosis not present

## 2020-04-07 DIAGNOSIS — S233XXA Sprain of ligaments of thoracic spine, initial encounter: Secondary | ICD-10-CM | POA: Diagnosis not present

## 2020-04-07 DIAGNOSIS — M9903 Segmental and somatic dysfunction of lumbar region: Secondary | ICD-10-CM | POA: Diagnosis not present

## 2020-04-21 DIAGNOSIS — M9902 Segmental and somatic dysfunction of thoracic region: Secondary | ICD-10-CM | POA: Diagnosis not present

## 2020-04-21 DIAGNOSIS — S134XXA Sprain of ligaments of cervical spine, initial encounter: Secondary | ICD-10-CM | POA: Diagnosis not present

## 2020-04-21 DIAGNOSIS — M9903 Segmental and somatic dysfunction of lumbar region: Secondary | ICD-10-CM | POA: Diagnosis not present

## 2020-04-21 DIAGNOSIS — S233XXA Sprain of ligaments of thoracic spine, initial encounter: Secondary | ICD-10-CM | POA: Diagnosis not present

## 2020-04-21 DIAGNOSIS — M47816 Spondylosis without myelopathy or radiculopathy, lumbar region: Secondary | ICD-10-CM | POA: Diagnosis not present

## 2020-04-21 DIAGNOSIS — M9901 Segmental and somatic dysfunction of cervical region: Secondary | ICD-10-CM | POA: Diagnosis not present

## 2020-05-05 DIAGNOSIS — M9901 Segmental and somatic dysfunction of cervical region: Secondary | ICD-10-CM | POA: Diagnosis not present

## 2020-05-05 DIAGNOSIS — S134XXA Sprain of ligaments of cervical spine, initial encounter: Secondary | ICD-10-CM | POA: Diagnosis not present

## 2020-05-05 DIAGNOSIS — M9903 Segmental and somatic dysfunction of lumbar region: Secondary | ICD-10-CM | POA: Diagnosis not present

## 2020-05-05 DIAGNOSIS — M9902 Segmental and somatic dysfunction of thoracic region: Secondary | ICD-10-CM | POA: Diagnosis not present

## 2020-05-05 DIAGNOSIS — S233XXA Sprain of ligaments of thoracic spine, initial encounter: Secondary | ICD-10-CM | POA: Diagnosis not present

## 2020-05-05 DIAGNOSIS — M47816 Spondylosis without myelopathy or radiculopathy, lumbar region: Secondary | ICD-10-CM | POA: Diagnosis not present

## 2020-05-19 DIAGNOSIS — S233XXA Sprain of ligaments of thoracic spine, initial encounter: Secondary | ICD-10-CM | POA: Diagnosis not present

## 2020-05-19 DIAGNOSIS — S134XXA Sprain of ligaments of cervical spine, initial encounter: Secondary | ICD-10-CM | POA: Diagnosis not present

## 2020-05-19 DIAGNOSIS — M9903 Segmental and somatic dysfunction of lumbar region: Secondary | ICD-10-CM | POA: Diagnosis not present

## 2020-05-19 DIAGNOSIS — M9901 Segmental and somatic dysfunction of cervical region: Secondary | ICD-10-CM | POA: Diagnosis not present

## 2020-05-19 DIAGNOSIS — M9902 Segmental and somatic dysfunction of thoracic region: Secondary | ICD-10-CM | POA: Diagnosis not present

## 2020-05-19 DIAGNOSIS — M47816 Spondylosis without myelopathy or radiculopathy, lumbar region: Secondary | ICD-10-CM | POA: Diagnosis not present

## 2020-06-02 DIAGNOSIS — M9902 Segmental and somatic dysfunction of thoracic region: Secondary | ICD-10-CM | POA: Diagnosis not present

## 2020-06-02 DIAGNOSIS — M9901 Segmental and somatic dysfunction of cervical region: Secondary | ICD-10-CM | POA: Diagnosis not present

## 2020-06-02 DIAGNOSIS — M47816 Spondylosis without myelopathy or radiculopathy, lumbar region: Secondary | ICD-10-CM | POA: Diagnosis not present

## 2020-06-02 DIAGNOSIS — S233XXA Sprain of ligaments of thoracic spine, initial encounter: Secondary | ICD-10-CM | POA: Diagnosis not present

## 2020-06-02 DIAGNOSIS — S134XXA Sprain of ligaments of cervical spine, initial encounter: Secondary | ICD-10-CM | POA: Diagnosis not present

## 2020-06-02 DIAGNOSIS — M9903 Segmental and somatic dysfunction of lumbar region: Secondary | ICD-10-CM | POA: Diagnosis not present

## 2020-06-14 ENCOUNTER — Other Ambulatory Visit: Payer: Self-pay

## 2020-06-14 ENCOUNTER — Encounter (INDEPENDENT_AMBULATORY_CARE_PROVIDER_SITE_OTHER): Payer: Self-pay | Admitting: Ophthalmology

## 2020-06-14 ENCOUNTER — Ambulatory Visit (INDEPENDENT_AMBULATORY_CARE_PROVIDER_SITE_OTHER): Payer: PPO | Admitting: Ophthalmology

## 2020-06-14 DIAGNOSIS — E113411 Type 2 diabetes mellitus with severe nonproliferative diabetic retinopathy with macular edema, right eye: Secondary | ICD-10-CM

## 2020-06-14 DIAGNOSIS — E113492 Type 2 diabetes mellitus with severe nonproliferative diabetic retinopathy without macular edema, left eye: Secondary | ICD-10-CM | POA: Diagnosis not present

## 2020-06-14 NOTE — Progress Notes (Signed)
06/14/2020     CHIEF COMPLAINT Patient presents for Retina Follow Up (3 Month diabetic f\u OU. OCT and FP/Pt states he has had occasional sharp pains in OS. Denies floaters and FOL./BGL: 134/A1C: 5)   HISTORY OF PRESENT ILLNESS: [name] is a 65 y.o. male who presents to the clinic today for:   HPI    Retina Follow Up    Patient presents with  Diabetic Retinopathy.  In right eye.  Severity is moderate.  Duration of 3 months.  Since onset it is stable.  I, the attending physician,  performed the HPI with the patient and updated documentation appropriately. Additional comments: 3 Month diabetic f\u OU. OCT and FP Pt states he has had occasional sharp pains in OS. Denies floaters and FOL. BGL: 134 A1C: 5       Last edited by Elyse Jarvis on 06/14/2020  8:08 AM. (History)      Referring physician: Juliette Alcide, MD [address] [place],  [place] [address]  HISTORICAL INFORMATION:   Selected notes from the MEDICAL RECORD NUMBER    Lab Results  Component Value Date   HGBA1C (H) 02/19/2007    8.5 (NOTE)   The ADA recommends the following therapeutic goals for glycemic   control related to Hgb A1C measurement:   Goal of Therapy:   < 7.0% Hgb A1C   Action Suggested:  > 8.0% Hgb A1C   Ref:  Diabetes Care, 22, Suppl. 1, 1999     CURRENT MEDICATIONS: No current outpatient medications on file. (Ophthalmic Drugs)   No current facility-administered medications for this visit. (Ophthalmic Drugs)   Current Outpatient Medications (Other)  Medication Sig  . aspirin 81 MG tablet Take 81 mg by mouth daily.  Marland Kitchen b complex vitamins tablet Take 1 tablet by mouth daily.  . Calcium 500-100 MG-UNIT CHEW Chew 1 tablet by mouth 4 (four) times daily.  . empagliflozin (JARDIANCE) 25 MG TABS tablet Take 25 mg by mouth daily.  . ergocalciferol (VITAMIN D2) 50000 units capsule Take 50,000 Units by mouth once a week.  . ferrous sulfate 325 (65 FE) MG EC tablet Take 325 mg by mouth daily.  Bess Harvest Ethyl 1 g CAPS Take 2 capsules twice a day for triglycerides  . Magnesium 250 MG TABS Take by mouth 2 (two) times daily.  . metFORMIN (GLUCOPHAGE) 850 MG tablet Take 850 mg by mouth 2 (two) times daily with a meal.  . metoprolol succinate (TOPROL-XL) 25 MG 24 hr tablet TAKE 1 TABLET BY MOUTH  DAILY  . Multiple Vitamin (MULTIVITAMIN) tablet Take 1 tablet by mouth 2 (two) times daily.  . potassium chloride SA (K-DUR,KLOR-CON) 20 MEQ tablet Take 20 mEq by mouth 2 (two) times daily.   . ramipril (ALTACE) 2.5 MG capsule TAKE 1 CAPSULE BY MOUTH  DAILY  . rosuvastatin (CRESTOR) 20 MG tablet TAKE 1 TABLET(20 MG) BY MOUTH DAILY  . vitamin B-12 (CYANOCOBALAMIN) 1000 MCG tablet Take 1,000 mcg by mouth daily.   No current facility-administered medications for this visit. (Other)      REVIEW OF SYSTEMS: ROS    Positive for: Endocrine   Last edited by Elyse Jarvis on 06/14/2020  8:08 AM. (History)       ALLERGIES No Known Allergies  PAST MEDICAL HISTORY Past Medical History:  Diagnosis Date  . Coronary artery disease    stents placed 2003  . Diabetes mellitus without complication (HCC)   . Hyperlipidemia   .  Hypertension   . Sleep apnea    on CPAP   Past Surgical History:  Procedure Laterality Date  . BARIATRIC SURGERY  12/2015  . BICEPS TENDON REPAIR    . CARDIAC CATHETERIZATION    . KNEE ARTHROSCOPY Bilateral   . SHOULDER ARTHROSCOPY Bilateral   . TONSILLECTOMY    . UVULLA REMOVAL      FAMILY HISTORY Family History  Problem Relation Age of Onset  . Diabetes Mother   . Diabetes Father   . Heart attack Father     SOCIAL HISTORY Social History   Tobacco Use  . Smoking status: Never Smoker  . Smokeless tobacco: Never Used  Vaping Use  . Vaping Use: Never used  Substance Use Topics  . Alcohol use: Not Currently    Alcohol/week: 0.0 standard drinks  . Drug use: Never         OPHTHALMIC EXAM:  Base Eye Exam    Visual Acuity (Snellen - Linear)       Right Left   Dist [place] 20/25 -2 20/60   Dist ph [place]  20/25 -1       Tonometry (Tonopen, 8:13 AM)      Right Left   Pressure 11 10       Pupils      Pupils Dark Light Shape React APD   Right PERRL 4 3 Round Brisk None   Left PERRL 4 3 Round Brisk None       Visual Fields (Counting fingers)      Left Right    Full Full       Neuro/Psych    Oriented x3: Yes   Mood/Affect: Normal       Dilation    Both eyes: 1.0% Mydriacyl, 2.5% Phenylephrine @ 8:13 AM        Slit Lamp and Fundus Exam    External Exam      Right Left   External Normal Normal       Slit Lamp Exam      Right Left   Lids/Lashes Normal Normal   Conjunctiva/Sclera White and quiet White and quiet   Cornea Clear Clear   Anterior Chamber Deep and quiet Deep and quiet   Iris Round and reactive Round and reactive   Lens Posterior chamber intraocular lens Posterior chamber intraocular lens   Anterior Vitreous Normal Normal       Fundus Exam      Right Left   Posterior Vitreous Normal Posterior vitreous detachment   Disc Normal Normal   C/D Ratio 0.65 0.65   Macula Microaneurysms , no clinically significant macular edema Microaneurysms, no macular thickening, no clinically significant macular edema   Vessels Severe NPDR Severe NPDR   Periphery Normal Normal          IMAGING AND PROCEDURES  Imaging and Procedures for 06/14/20  OCT, Retina - OU - Both Eyes       Right Eye Quality was good. Central Foveal Thickness: 275. Progression has improved.   Left Eye Quality was good. Scan locations included subfoveal. Central Foveal Thickness: 291. Progression has been stable.   Notes Region of CSME temporal to the fovea, now 3 months post Eylea vastly improved        Color Fundus Photography Optos - OU - Both Eyes       Right Eye Progression has been stable. Disc findings include increased cup to disc ratio. Macula : normal observations. Vessels : IRMA.   Left Eye Progression has  been  stable. Disc findings include increased cup to disc ratio. Macula : normal observations. Vessels : IRMA.   Notes Good PRP temporally OS                ASSESSMENT/PLAN:  Severe nonproliferative diabetic retinopathy of left eye without macular edema associated with type 2 diabetes mellitus (HCC) Stable, good PRP in the temporal nonperfused retina, may have decrease vegF load as CSME has not recurred  Severe nonproliferative diabetic retinopathy of right eye, with macular edema, associated with type 2 diabetes mellitus (HCC) Former CSME OD under control, no recurrence at 67-month interval we will continue to monitor and observe      ICD-10-CM   1. Severe nonproliferative diabetic retinopathy of right eye, with macular edema, associated with type 2 diabetes mellitus (HCC)  E11.3411 OCT, Retina - OU - Both Eyes    Color Fundus Photography Optos - OU - Both Eyes  2. Severe nonproliferative diabetic retinopathy of left eye without macular edema associated with type 2 diabetes mellitus (HCC)  G53.6468     1.  2.  3.  Ophthalmic Meds Ordered this visit:  No orders of the defined types were placed in this encounter.      Return in about 3 months (around 09/13/2020) for DILATE OU, OCT.  There are no Patient Instructions on file for this visit.   Explained the diagnoses, plan, and follow up with the patient and they expressed understanding.  Patient expressed understanding of the importance of proper follow up care.   Alford Highland Kaitelyn Jamison M.D. Diseases & Surgery of the Retina and Vitreous [facility] 06/14/20     Abbreviations: M myopia (nearsighted); A astigmatism; H hyperopia (farsighted); P presbyopia; Mrx spectacle prescription;  CTL contact lenses; OD right eye; OS left eye; OU both eyes  XT exotropia; ET esotropia; PEK punctate epithelial keratitis; PEE punctate epithelial erosions; DES dry eye syndrome; MGD meibomian gland dysfunction; ATs artificial tears;  PFAT's preservative free artificial tears; NSC nuclear sclerotic cataract; PSC posterior subcapsular cataract; ERM epi-retinal membrane; PVD posterior vitreous detachment; RD retinal detachment; DM diabetes mellitus; DR diabetic retinopathy; NPDR non-proliferative diabetic retinopathy; PDR proliferative diabetic retinopathy; CSME clinically significant macular edema; DME diabetic macular edema; dbh dot blot hemorrhages; CWS cotton wool spot; POAG primary open angle glaucoma; C/D cup-to-disc ratio; HVF humphrey visual field; GVF goldmann visual field; OCT optical coherence tomography; IOP intraocular pressure; BRVO Branch retinal vein occlusion; CRVO central retinal vein occlusion; CRAO central retinal artery occlusion; BRAO branch retinal artery occlusion; RT retinal tear; SB scleral buckle; PPV pars plana vitrectomy; VH Vitreous hemorrhage; PRP panretinal laser photocoagulation; IVK intravitreal kenalog; VMT vitreomacular traction; MH Macular hole;  NVD neovascularization of the disc; NVE neovascularization elsewhere; AREDS age related eye disease study; ARMD age related macular degeneration; POAG primary open angle glaucoma; EBMD epithelial/anterior basement membrane dystrophy; ACIOL anterior chamber intraocular lens; IOL intraocular lens; PCIOL posterior chamber intraocular lens; Phaco/IOL phacoemulsification with intraocular lens placement; PRK photorefractive keratectomy; LASIK laser assisted in situ keratomileusis; HTN hypertension; DM diabetes mellitus; COPD chronic obstructive pulmonary disease

## 2020-06-14 NOTE — Assessment & Plan Note (Signed)
Stable, good PRP in the temporal nonperfused retina, may have decrease vegF load as CSME has not recurred

## 2020-06-14 NOTE — Assessment & Plan Note (Signed)
Former CSME OD under control, no recurrence at 75-month interval we will continue to monitor and observe

## 2020-06-16 DIAGNOSIS — S233XXA Sprain of ligaments of thoracic spine, initial encounter: Secondary | ICD-10-CM | POA: Diagnosis not present

## 2020-06-16 DIAGNOSIS — M9903 Segmental and somatic dysfunction of lumbar region: Secondary | ICD-10-CM | POA: Diagnosis not present

## 2020-06-16 DIAGNOSIS — M47816 Spondylosis without myelopathy or radiculopathy, lumbar region: Secondary | ICD-10-CM | POA: Diagnosis not present

## 2020-06-16 DIAGNOSIS — S134XXA Sprain of ligaments of cervical spine, initial encounter: Secondary | ICD-10-CM | POA: Diagnosis not present

## 2020-06-16 DIAGNOSIS — M9902 Segmental and somatic dysfunction of thoracic region: Secondary | ICD-10-CM | POA: Diagnosis not present

## 2020-06-16 DIAGNOSIS — M9901 Segmental and somatic dysfunction of cervical region: Secondary | ICD-10-CM | POA: Diagnosis not present

## 2020-06-21 DIAGNOSIS — E538 Deficiency of other specified B group vitamins: Secondary | ICD-10-CM | POA: Diagnosis not present

## 2020-06-21 DIAGNOSIS — E782 Mixed hyperlipidemia: Secondary | ICD-10-CM | POA: Diagnosis not present

## 2020-06-21 DIAGNOSIS — E559 Vitamin D deficiency, unspecified: Secondary | ICD-10-CM | POA: Diagnosis not present

## 2020-06-21 DIAGNOSIS — D508 Other iron deficiency anemias: Secondary | ICD-10-CM | POA: Diagnosis not present

## 2020-06-21 DIAGNOSIS — E1159 Type 2 diabetes mellitus with other circulatory complications: Secondary | ICD-10-CM | POA: Diagnosis not present

## 2020-06-30 DIAGNOSIS — M9901 Segmental and somatic dysfunction of cervical region: Secondary | ICD-10-CM | POA: Diagnosis not present

## 2020-06-30 DIAGNOSIS — M9902 Segmental and somatic dysfunction of thoracic region: Secondary | ICD-10-CM | POA: Diagnosis not present

## 2020-06-30 DIAGNOSIS — M47816 Spondylosis without myelopathy or radiculopathy, lumbar region: Secondary | ICD-10-CM | POA: Diagnosis not present

## 2020-06-30 DIAGNOSIS — S233XXA Sprain of ligaments of thoracic spine, initial encounter: Secondary | ICD-10-CM | POA: Diagnosis not present

## 2020-06-30 DIAGNOSIS — M9903 Segmental and somatic dysfunction of lumbar region: Secondary | ICD-10-CM | POA: Diagnosis not present

## 2020-06-30 DIAGNOSIS — S134XXA Sprain of ligaments of cervical spine, initial encounter: Secondary | ICD-10-CM | POA: Diagnosis not present

## 2020-07-06 ENCOUNTER — Encounter (INDEPENDENT_AMBULATORY_CARE_PROVIDER_SITE_OTHER): Payer: Self-pay

## 2020-07-06 DIAGNOSIS — I10 Essential (primary) hypertension: Secondary | ICD-10-CM | POA: Diagnosis not present

## 2020-07-06 DIAGNOSIS — E538 Deficiency of other specified B group vitamins: Secondary | ICD-10-CM | POA: Diagnosis not present

## 2020-07-06 DIAGNOSIS — E559 Vitamin D deficiency, unspecified: Secondary | ICD-10-CM | POA: Diagnosis not present

## 2020-07-06 DIAGNOSIS — E1142 Type 2 diabetes mellitus with diabetic polyneuropathy: Secondary | ICD-10-CM | POA: Diagnosis not present

## 2020-07-06 DIAGNOSIS — D508 Other iron deficiency anemias: Secondary | ICD-10-CM | POA: Diagnosis not present

## 2020-07-06 DIAGNOSIS — Z9884 Bariatric surgery status: Secondary | ICD-10-CM | POA: Diagnosis not present

## 2020-07-06 DIAGNOSIS — E1159 Type 2 diabetes mellitus with other circulatory complications: Secondary | ICD-10-CM | POA: Diagnosis not present

## 2020-07-06 DIAGNOSIS — I2510 Atherosclerotic heart disease of native coronary artery without angina pectoris: Secondary | ICD-10-CM | POA: Diagnosis not present

## 2020-07-06 DIAGNOSIS — E876 Hypokalemia: Secondary | ICD-10-CM | POA: Diagnosis not present

## 2020-07-06 DIAGNOSIS — E113313 Type 2 diabetes mellitus with moderate nonproliferative diabetic retinopathy with macular edema, bilateral: Secondary | ICD-10-CM | POA: Diagnosis not present

## 2020-07-06 DIAGNOSIS — E782 Mixed hyperlipidemia: Secondary | ICD-10-CM | POA: Diagnosis not present

## 2020-07-14 DIAGNOSIS — M9902 Segmental and somatic dysfunction of thoracic region: Secondary | ICD-10-CM | POA: Diagnosis not present

## 2020-07-14 DIAGNOSIS — S134XXA Sprain of ligaments of cervical spine, initial encounter: Secondary | ICD-10-CM | POA: Diagnosis not present

## 2020-07-14 DIAGNOSIS — M47816 Spondylosis without myelopathy or radiculopathy, lumbar region: Secondary | ICD-10-CM | POA: Diagnosis not present

## 2020-07-14 DIAGNOSIS — M9901 Segmental and somatic dysfunction of cervical region: Secondary | ICD-10-CM | POA: Diagnosis not present

## 2020-07-14 DIAGNOSIS — S233XXA Sprain of ligaments of thoracic spine, initial encounter: Secondary | ICD-10-CM | POA: Diagnosis not present

## 2020-07-14 DIAGNOSIS — M9903 Segmental and somatic dysfunction of lumbar region: Secondary | ICD-10-CM | POA: Diagnosis not present

## 2020-07-28 DIAGNOSIS — S134XXA Sprain of ligaments of cervical spine, initial encounter: Secondary | ICD-10-CM | POA: Diagnosis not present

## 2020-07-28 DIAGNOSIS — M9901 Segmental and somatic dysfunction of cervical region: Secondary | ICD-10-CM | POA: Diagnosis not present

## 2020-07-28 DIAGNOSIS — M9903 Segmental and somatic dysfunction of lumbar region: Secondary | ICD-10-CM | POA: Diagnosis not present

## 2020-07-28 DIAGNOSIS — M47816 Spondylosis without myelopathy or radiculopathy, lumbar region: Secondary | ICD-10-CM | POA: Diagnosis not present

## 2020-07-28 DIAGNOSIS — S233XXA Sprain of ligaments of thoracic spine, initial encounter: Secondary | ICD-10-CM | POA: Diagnosis not present

## 2020-07-28 DIAGNOSIS — M9902 Segmental and somatic dysfunction of thoracic region: Secondary | ICD-10-CM | POA: Diagnosis not present

## 2020-08-11 DIAGNOSIS — S134XXA Sprain of ligaments of cervical spine, initial encounter: Secondary | ICD-10-CM | POA: Diagnosis not present

## 2020-08-11 DIAGNOSIS — M9901 Segmental and somatic dysfunction of cervical region: Secondary | ICD-10-CM | POA: Diagnosis not present

## 2020-08-11 DIAGNOSIS — M47816 Spondylosis without myelopathy or radiculopathy, lumbar region: Secondary | ICD-10-CM | POA: Diagnosis not present

## 2020-08-11 DIAGNOSIS — S233XXA Sprain of ligaments of thoracic spine, initial encounter: Secondary | ICD-10-CM | POA: Diagnosis not present

## 2020-08-11 DIAGNOSIS — M9902 Segmental and somatic dysfunction of thoracic region: Secondary | ICD-10-CM | POA: Diagnosis not present

## 2020-08-11 DIAGNOSIS — M9903 Segmental and somatic dysfunction of lumbar region: Secondary | ICD-10-CM | POA: Diagnosis not present

## 2020-08-16 DIAGNOSIS — G4733 Obstructive sleep apnea (adult) (pediatric): Secondary | ICD-10-CM | POA: Diagnosis not present

## 2020-08-17 ENCOUNTER — Other Ambulatory Visit: Payer: Self-pay | Admitting: Otolaryngology

## 2020-08-19 ENCOUNTER — Encounter (HOSPITAL_BASED_OUTPATIENT_CLINIC_OR_DEPARTMENT_OTHER): Payer: Self-pay | Admitting: Otolaryngology

## 2020-08-19 ENCOUNTER — Other Ambulatory Visit: Payer: Self-pay

## 2020-08-19 ENCOUNTER — Encounter (HOSPITAL_BASED_OUTPATIENT_CLINIC_OR_DEPARTMENT_OTHER)
Admission: RE | Admit: 2020-08-19 | Discharge: 2020-08-19 | Disposition: A | Discharge status: Home / Self Care | Payer: PPO | Source: Ambulatory Visit | Attending: Otolaryngology | Admitting: Otolaryngology

## 2020-08-19 DIAGNOSIS — Z01818 Encounter for other preprocedural examination: Secondary | ICD-10-CM | POA: Diagnosis not present

## 2020-08-19 LAB — BASIC METABOLIC PANEL
Anion gap: 8 (ref 5–15)
BUN: 16 mg/dL (ref 8–23)
CO2: 22 mmol/L (ref 22–32)
Calcium: 8.5 mg/dL — ABNORMAL LOW (ref 8.9–10.3)
Chloride: 108 mmol/L (ref 98–111)
Creatinine, Ser: 0.82 mg/dL (ref 0.61–1.24)
GFR, Estimated: >60 mL/min (ref >60)
Glucose, Bld: 117 mg/dL — ABNORMAL HIGH (ref 70–99)
Potassium: 4.1 mmol/L (ref 3.5–5.1)
Sodium: 138 mmol/L (ref 135–145)

## 2020-08-25 DIAGNOSIS — M9901 Segmental and somatic dysfunction of cervical region: Secondary | ICD-10-CM | POA: Diagnosis not present

## 2020-08-25 DIAGNOSIS — S233XXA Sprain of ligaments of thoracic spine, initial encounter: Secondary | ICD-10-CM | POA: Diagnosis not present

## 2020-08-25 DIAGNOSIS — M9902 Segmental and somatic dysfunction of thoracic region: Secondary | ICD-10-CM | POA: Diagnosis not present

## 2020-08-25 DIAGNOSIS — M47816 Spondylosis without myelopathy or radiculopathy, lumbar region: Secondary | ICD-10-CM | POA: Diagnosis not present

## 2020-08-25 DIAGNOSIS — M9903 Segmental and somatic dysfunction of lumbar region: Secondary | ICD-10-CM | POA: Diagnosis not present

## 2020-08-25 DIAGNOSIS — S134XXA Sprain of ligaments of cervical spine, initial encounter: Secondary | ICD-10-CM | POA: Diagnosis not present

## 2020-08-26 ENCOUNTER — Ambulatory Visit (HOSPITAL_BASED_OUTPATIENT_CLINIC_OR_DEPARTMENT_OTHER)
Admission: RE | Admit: 2020-08-26 | Discharge: 2020-08-26 | Disposition: A | Discharge status: Home / Self Care | Payer: PPO | Attending: Otolaryngology | Admitting: Otolaryngology

## 2020-08-26 ENCOUNTER — Ambulatory Visit (HOSPITAL_BASED_OUTPATIENT_CLINIC_OR_DEPARTMENT_OTHER): Payer: PPO | Admitting: Anesthesiology

## 2020-08-26 ENCOUNTER — Encounter (HOSPITAL_BASED_OUTPATIENT_CLINIC_OR_DEPARTMENT_OTHER)
Admission: RE | Disposition: A | Discharge status: Home / Self Care | Payer: Self-pay | Source: Home / Self Care | Attending: Otolaryngology

## 2020-08-26 ENCOUNTER — Encounter (HOSPITAL_BASED_OUTPATIENT_CLINIC_OR_DEPARTMENT_OTHER): Payer: Self-pay | Admitting: Otolaryngology

## 2020-08-26 ENCOUNTER — Other Ambulatory Visit: Payer: Self-pay

## 2020-08-26 DIAGNOSIS — Z79899 Other long term (current) drug therapy: Secondary | ICD-10-CM | POA: Diagnosis not present

## 2020-08-26 DIAGNOSIS — G4733 Obstructive sleep apnea (adult) (pediatric): Secondary | ICD-10-CM | POA: Insufficient documentation

## 2020-08-26 DIAGNOSIS — Z7984 Long term (current) use of oral hypoglycemic drugs: Secondary | ICD-10-CM | POA: Diagnosis not present

## 2020-08-26 DIAGNOSIS — Z7982 Long term (current) use of aspirin: Secondary | ICD-10-CM | POA: Insufficient documentation

## 2020-08-26 DIAGNOSIS — Z9884 Bariatric surgery status: Secondary | ICD-10-CM | POA: Diagnosis not present

## 2020-08-26 DIAGNOSIS — E113411 Type 2 diabetes mellitus with severe nonproliferative diabetic retinopathy with macular edema, right eye: Secondary | ICD-10-CM | POA: Diagnosis not present

## 2020-08-26 DIAGNOSIS — Z955 Presence of coronary angioplasty implant and graft: Secondary | ICD-10-CM | POA: Diagnosis not present

## 2020-08-26 DIAGNOSIS — I10 Essential (primary) hypertension: Secondary | ICD-10-CM | POA: Diagnosis not present

## 2020-08-26 HISTORY — PX: DRUG INDUCED ENDOSCOPY: SHX6808

## 2020-08-26 LAB — GLUCOSE, CAPILLARY
Glucose-Capillary: 121 mg/dL — ABNORMAL HIGH (ref 70–99)
Glucose-Capillary: 118 mg/dL — ABNORMAL HIGH (ref 70–99)

## 2020-08-26 SURGERY — DRUG INDUCED SLEEP ENDOSCOPY
Anesthesia: Monitor Anesthesia Care | Site: Nose

## 2020-08-26 MED ORDER — OXYCODONE HCL 5 MG/5ML PO SOLN: 5.0000 mg | Freq: Once | ORAL | Status: DC | PRN

## 2020-08-26 MED ORDER — ACETAMINOPHEN 325 MG PO TABS: 325.0000 mg | ORAL_TABLET | ORAL | Status: DC | PRN

## 2020-08-26 MED ORDER — LACTATED RINGERS IV SOLN: INTRAVENOUS | Status: DC

## 2020-08-26 MED ORDER — PROPOFOL 10 MG/ML IV BOLUS
INTRAVENOUS | Status: DC | PRN
  Administered 2020-08-26: 20 mg via INTRAVENOUS
  Administered 2020-08-26 (×3): 10 mg via INTRAVENOUS

## 2020-08-26 MED ORDER — OXYCODONE HCL 5 MG PO TABS: 5.0000 mg | ORAL_TABLET | Freq: Once | ORAL | Status: DC | PRN

## 2020-08-26 MED ORDER — FENTANYL CITRATE (PF) 100 MCG/2ML IJ SOLN: 25.0000 ug | INTRAMUSCULAR | Status: DC | PRN

## 2020-08-26 MED ORDER — PROPOFOL 500 MG/50ML IV EMUL
INTRAVENOUS | Status: DC | PRN
  Administered 2020-08-26: 50 ug/kg/min via INTRAVENOUS

## 2020-08-26 MED ORDER — ONDANSETRON HCL 4 MG/2ML IJ SOLN: 4.0000 mg | Freq: Once | INTRAMUSCULAR | Status: DC | PRN

## 2020-08-26 MED ORDER — MEPERIDINE HCL 25 MG/ML IJ SOLN: 6.2500 mg | INTRAMUSCULAR | Status: DC | PRN

## 2020-08-26 MED ORDER — ACETAMINOPHEN 160 MG/5ML PO SOLN: 325.0000 mg | ORAL | Status: DC | PRN

## 2020-08-26 SURGICAL SUPPLY — 17 items
CANISTER SUCT 1200ML W/VALVE (MISCELLANEOUS) IMPLANT
COVER WAND RF STERILE (DRAPES) IMPLANT
GAUZE SPONGE 4X4 12PLY STRL LF (GAUZE/BANDAGES/DRESSINGS) ×2 IMPLANT
GLOVE SURG ENC TEXT LTX SZ7 (GLOVE) ×2 IMPLANT
GLOVE SURG UNDER POLY LF SZ6.5 (GLOVE) ×2 IMPLANT
GLOVE SURG UNDER POLY LF SZ7 (GLOVE) ×2 IMPLANT
KIT CLEAN ENDO (MISCELLANEOUS) ×2 IMPLANT
NEEDLE PRECISIONGLIDE 27X1.5 (NEEDLE) IMPLANT
PACK BASIN DAY SURGERY FS (CUSTOM PROCEDURE TRAY) IMPLANT
PATTIES SURGICAL .5 X3 (DISPOSABLE) IMPLANT
SHEET MEDIUM DRAPE 40X70 STRL (DRAPES) ×2 IMPLANT
SOL ANTI FOG 6CC (MISCELLANEOUS) IMPLANT
SOLUTION ANTI FOG 6CC (MISCELLANEOUS)
SPONGE NEURO XRAY DETECT 1X3 (DISPOSABLE) IMPLANT
SYR CONTROL 10ML LL (SYRINGE) IMPLANT
TOWEL GREEN STERILE FF (TOWEL DISPOSABLE) ×2 IMPLANT
TUBE CONNECTING 20X1/4 (TUBING) IMPLANT

## 2020-08-26 NOTE — H&P (Signed)
[name] is an 65 y.o. male.   Chief Complaint: Obstructive sleep apnea HPI: History of obstructive sleep apnea, unable to tolerate CPAP for long-term treatment.  Past Medical History:  Diagnosis Date   Coronary artery disease    stents placed 2003   Diabetes mellitus without complication (HCC)    Hyperlipidemia    Hypertension    Sleep apnea    on CPAP    Past Surgical History:  Procedure Laterality Date   BARIATRIC SURGERY  12/2015   BICEPS TENDON REPAIR     CARDIAC CATHETERIZATION     KNEE ARTHROSCOPY Bilateral    SHOULDER ARTHROSCOPY Bilateral    TONSILLECTOMY     UVULLA REMOVAL      Family History  Problem Relation Age of Onset   Diabetes Mother    Diabetes Father    Heart attack Father    Social History:  reports that he has never smoked. He has never used smokeless tobacco. He reports previous alcohol use. He reports that he does not use drugs.  Allergies: No Known Allergies  Medications Prior to Admission  Medication Sig Dispense Refill   aspirin 81 MG tablet Take 81 mg by mouth daily.     b complex vitamins tablet Take 1 tablet by mouth daily.     Calcium 500-100 MG-UNIT CHEW Chew 1 tablet by mouth 4 (four) times daily.     empagliflozin (JARDIANCE) 25 MG TABS tablet Take 25 mg by mouth daily.     ergocalciferol (VITAMIN D2) 50000 units capsule Take 50,000 Units by mouth once a week.     ferrous sulfate 325 (65 FE) MG EC tablet Take 325 mg by mouth daily.     Icosapent Ethyl 1 g CAPS Take 2 capsules twice a day for triglycerides     Magnesium 250 MG TABS Take by mouth 2 (two) times daily.     metFORMIN (GLUCOPHAGE) 850 MG tablet Take 850 mg by mouth 2 (two) times daily with a meal.     metoprolol succinate (TOPROL-XL) 25 MG 24 hr tablet TAKE 1 TABLET BY MOUTH  DAILY 90 tablet 3   Multiple Vitamin (MULTIVITAMIN) tablet Take 1 tablet by mouth 2 (two) times daily.     potassium chloride SA (K-DUR,KLOR-CON) 20 MEQ tablet Take 20 mEq by mouth 2 (two) times  daily.      ramipril (ALTACE) 2.5 MG capsule TAKE 1 CAPSULE BY MOUTH  DAILY 90 capsule 3   rosuvastatin (CRESTOR) 20 MG tablet TAKE 1 TABLET(20 MG) BY MOUTH DAILY 90 tablet 2   vitamin B-12 (CYANOCOBALAMIN) 1000 MCG tablet Take 1,000 mcg by mouth daily.      No results found for this or any previous visit (from the past 48 hour(s)). No results found.  Review of Systems  Respiratory:  Positive for apnea.    Blood pressure 127/66, pulse 69, temperature 98.1 F (36.7 C), temperature source Oral, resp. rate 18, height 5\' 11"  (1.803 m), weight 96 kg, SpO2 97 %. Physical Exam Cardiovascular:     Rate and Rhythm: Normal rate.  Pulmonary:     Effort: Pulmonary effort is normal.  Musculoskeletal:     Cervical back: Normal range of motion.     Assessment/Plan Patient admitted for outpatient drug-induced sleep endoscopy for assessment of airway obstruction.  , MD 08/26/2020, 10:13 AM

## 2020-08-26 NOTE — Anesthesia Postprocedure Evaluation (Signed)
Anesthesia Post Note  Patient: [name]  Procedure(s) Performed: DRUG INDUCED ENDOSCOPY (Nose)     Patient location during evaluation: PACU Anesthesia Type: MAC Level of consciousness: awake and alert Pain management: pain level controlled Vital Signs Assessment: post-procedure vital signs reviewed and stable Respiratory status: spontaneous breathing, nonlabored ventilation, respiratory function stable and patient connected to nasal cannula oxygen Cardiovascular status: stable and blood pressure returned to baseline Postop Assessment: no apparent nausea or vomiting Anesthetic complications: no   No notable events documented.  Last Vitals:  Vitals:   08/26/20 1115 08/26/20 1135  BP: 120/73 127/70  Pulse: 67 62  Resp: 11 15  Temp:  36.6 C  SpO2: 95% 97%    Last Pain:  Vitals:   08/26/20 1135  TempSrc:   PainSc: 0-No pain                 Stormy Connon

## 2020-08-26 NOTE — Transfer of Care (Signed)
Immediate Anesthesia Transfer of Care Note  Patient: [name]  Procedure(s) Performed: DRUG INDUCED ENDOSCOPY (Nose)  Patient Location: PACU  Anesthesia Type:MAC  Level of Consciousness: awake, alert  and oriented  Airway & Oxygen Therapy: Patient Spontanous Breathing and Patient connected to face mask oxygen  Post-op Assessment: Report given to RN and Post -op Vital signs reviewed and stable  Post vital signs: Reviewed and stable  Last Vitals:  Vitals Value Taken Time  BP    Temp    Pulse    Resp    SpO2      Last Pain:  Vitals:   08/26/20 1012  TempSrc: Oral  PainSc: 0-No pain         Complications: No notable events documented.

## 2020-08-26 NOTE — Op Note (Signed)
Operative Note: DRUG INDUCED SLEEP ENDOSCOPY  Patient: [name] record number: [id number]  Date:08/26/2020  Pre-operative Indications: 1.  Obstructive Sleep Apnea  Postoperative Indications: Same  Surgical Procedure: 1.  Drug Induced Sleep Endoscopy (DISE)  Anesthesia: MAC with IV sedation  Surgeon: Delsa Bern, M.D.  Complications: None  EBL: None  Findings: There is no evidence of complete concentric palatal obstruction.  Anatomically the patient should be a candidate for hypoglossal nerve stimulation therapy.     Brief History: The patient is a 65 y.o. male with a history of obstructive sleep apnea. The patient has undergone previous sleep study which showed mild to moderate levels of obstructive sleep apnea.  The patient was prescribed CPAP which they consistently attempted to use without success.  Given the patient's history and findings, the above drug-induced sleep endoscopy was recommended to assess the patient's anatomic level of apnea.  Risks and benefits were discussed in detail with the patient today understand and agree with our plan for surgery which is scheduled at [facility] under sedated anesthesia as an outpatient.  Surgical Procedure: The patient is brought to the operating room on 08/26/2020 and placed in supine position on the operating table.  Intravenous sedated anesthesia was established without difficulty using the standard drug-induced sleep endoscopy protocol. When the patient was adequately anesthetized, surgical timeout was performed and correct identification of the patient and the surgical procedure.    A propofol infusion was administered and the patient was monitored carefully to achieve a level of sedation appropriate for DISE.  The patient did not respond to verbal commands but still had spontaneous respiration, sleep disordered breathing and associated desaturations were observed.  With the patient under adequate sedated anesthesia the  flexible nasal laryngoscope was passed without difficulty.  The patient's nasal cavity showed mild septal deviation.  The endoscope was then passed to visualize the velopharynx, oropharynx, tongue base and epiglottis to assess areas of obstruction.  Patient's airway showed previous UPPP, obstruction of BOT and supraglotits. Obstruction responded to jaw thrust.   There was no evidence of complete concentric palatal obstruction and the patient appeared to be a candidate anatomically for hypoglossal nerve stimulation therapy.  Surgical sponge count was correct. Patient was awakened from anesthetic and transferred from the operating room to the recovery room in stable condition. There were no complications and no blood loss.   Delsa Bern, M.D. [facility] 08/26/2020

## 2020-08-26 NOTE — Anesthesia Preprocedure Evaluation (Addendum)
Anesthesia Evaluation  Patient identified by MRN, date of birth, ID band Patient awake    Reviewed: Allergy & Precautions, H&P , NPO status , Patient's Chart, lab work & pertinent test results, reviewed documented beta blocker date and time   Airway Mallampati: II  TM Distance: >3 FB Neck ROM: full    Dental no notable dental hx.    Pulmonary sleep apnea ,    Pulmonary exam normal breath sounds clear to auscultation       Cardiovascular Exercise Tolerance: Good hypertension, Pt. on medications and Pt. on home beta blockers  Rhythm:regular Rate:Normal  ECHO 5/17 Left ventricle: The cavity size was normal. Wall thickness was  increased in a pattern of moderate LVH. Systolic function was  normal. The estimated ejection fraction was in the range of 60%  to 65%. Wall motion was normal; there were no regional wall  motion abnormalities. Left ventricular diastolic function  parameters were normal.  - Aortic valve: Valve area (VTI): 4.3 cm^2. Valve area (Vmax): 4.12  cm^2. Valve area (Vmean): 3.19 cm^2.  - Technically adequate study.    Neuro/Psych negative neurological ROS  negative psych ROS   GI/Hepatic negative GI ROS, Neg liver ROS,   Endo/Other  diabetes, Oral Hypoglycemic Agents  Renal/GU negative Renal ROS  negative genitourinary   Musculoskeletal   Abdominal   Peds  Hematology negative hematology ROS (+)   Anesthesia Other Findings   Reproductive/Obstetrics negative OB ROS                            Anesthesia Physical Anesthesia Plan  ASA: 3  Anesthesia Plan: MAC   Post-op Pain Management:    Induction: Intravenous  PONV Risk Score and Plan: 1  Airway Management Planned: Natural Airway and Simple Face Mask  Additional Equipment: None  Intra-op Plan:   Post-operative Plan:   Informed Consent: I have reviewed the patients History and Physical, chart, labs  and discussed the procedure including the risks, benefits and alternatives for the proposed anesthesia with the patient or authorized representative who has indicated his/her understanding and acceptance.     Dental Advisory Given  Plan Discussed with: CRNA and Anesthesiologist  Anesthesia Plan Comments:         Anesthesia Quick Evaluation

## 2020-08-28 ENCOUNTER — Encounter (HOSPITAL_BASED_OUTPATIENT_CLINIC_OR_DEPARTMENT_OTHER): Payer: Self-pay | Admitting: Otolaryngology

## 2020-09-06 DIAGNOSIS — E119 Type 2 diabetes mellitus without complications: Secondary | ICD-10-CM | POA: Diagnosis not present

## 2020-09-06 DIAGNOSIS — G4733 Obstructive sleep apnea (adult) (pediatric): Secondary | ICD-10-CM | POA: Diagnosis not present

## 2020-09-06 DIAGNOSIS — F5112 Insufficient sleep syndrome: Secondary | ICD-10-CM | POA: Diagnosis not present

## 2020-09-06 DIAGNOSIS — Z794 Long term (current) use of insulin: Secondary | ICD-10-CM | POA: Diagnosis not present

## 2020-09-06 DIAGNOSIS — I10 Essential (primary) hypertension: Secondary | ICD-10-CM | POA: Diagnosis not present

## 2020-09-07 ENCOUNTER — Encounter (INDEPENDENT_AMBULATORY_CARE_PROVIDER_SITE_OTHER): Payer: Self-pay | Admitting: Ophthalmology

## 2020-09-07 ENCOUNTER — Other Ambulatory Visit: Payer: Self-pay

## 2020-09-07 ENCOUNTER — Ambulatory Visit (INDEPENDENT_AMBULATORY_CARE_PROVIDER_SITE_OTHER): Payer: PPO | Admitting: Ophthalmology

## 2020-09-07 DIAGNOSIS — G4733 Obstructive sleep apnea (adult) (pediatric): Secondary | ICD-10-CM

## 2020-09-07 DIAGNOSIS — E113411 Type 2 diabetes mellitus with severe nonproliferative diabetic retinopathy with macular edema, right eye: Secondary | ICD-10-CM

## 2020-09-07 DIAGNOSIS — E113492 Type 2 diabetes mellitus with severe nonproliferative diabetic retinopathy without macular edema, left eye: Secondary | ICD-10-CM | POA: Diagnosis not present

## 2020-09-07 NOTE — Assessment & Plan Note (Signed)
Compliant on CPAP 

## 2020-09-07 NOTE — Assessment & Plan Note (Signed)
Mild CSME temporally, not center involved will observe

## 2020-09-07 NOTE — Assessment & Plan Note (Signed)
Mild CSME temporally, observe

## 2020-09-07 NOTE — Progress Notes (Signed)
09/07/2020     CHIEF COMPLAINT Patient presents for Retina Follow Up (3 month fu OU and OCT/Pt states, "I feel like my vision kind of gets blurred at times and I do experience pain at times behind my OS."/Type II DM - A1C: 5.0  LBS: 118///)   HISTORY OF PRESENT ILLNESS: [name] is a 65 y.o. male who presents to the clinic today for:   HPI     Retina Follow Up           Diagnosis: Diabetic Retinopathy   Laterality: both eyes   Onset: 3 months ago   Severity: mild   Duration: 3 months   Course: stable   Comments: 3 month fu OU and OCT Pt states, "I feel like my vision kind of gets blurred at times and I do experience pain at times behind my OS." Type II DM - A1C: 5.0  LBS: 118          Last edited by Demetrios Loll, COA on 09/07/2020  8:40 AM.      Referring physician: Juliette Alcide, MD [address] [place],  [place] [address]  HISTORICAL INFORMATION:   Selected notes from the MEDICAL RECORD NUMBER    Lab Results  Component Value Date   HGBA1C (H) 02/19/2007    8.5 (NOTE)   The ADA recommends the following therapeutic goals for glycemic   control related to Hgb A1C measurement:   Goal of Therapy:   < 7.0% Hgb A1C   Action Suggested:  > 8.0% Hgb A1C   Ref:  Diabetes Care, 22, Suppl. 1, 1999     CURRENT MEDICATIONS: No current outpatient medications on file. (Ophthalmic Drugs)   No current facility-administered medications for this visit. (Ophthalmic Drugs)   Current Outpatient Medications (Other)  Medication Sig   aspirin 81 MG tablet Take 81 mg by mouth daily.   b complex vitamins tablet Take 1 tablet by mouth daily.   Calcium 500-100 MG-UNIT CHEW Chew 1 tablet by mouth 4 (four) times daily.   empagliflozin (JARDIANCE) 25 MG TABS tablet Take 25 mg by mouth daily.   ergocalciferol (VITAMIN D2) 50000 units capsule Take 50,000 Units by mouth once a week.   ferrous sulfate 325 (65 FE) MG EC tablet Take 325 mg by mouth daily.   Icosapent Ethyl 1 g CAPS Take  2 capsules twice a day for triglycerides   Magnesium 250 MG TABS Take by mouth 2 (two) times daily.   metFORMIN (GLUCOPHAGE) 850 MG tablet Take 850 mg by mouth 2 (two) times daily with a meal.   metoprolol succinate (TOPROL-XL) 25 MG 24 hr tablet TAKE 1 TABLET BY MOUTH  DAILY   Multiple Vitamin (MULTIVITAMIN) tablet Take 1 tablet by mouth 2 (two) times daily.   potassium chloride SA (K-DUR,KLOR-CON) 20 MEQ tablet Take 20 mEq by mouth 2 (two) times daily.    ramipril (ALTACE) 2.5 MG capsule TAKE 1 CAPSULE BY MOUTH  DAILY   rosuvastatin (CRESTOR) 20 MG tablet TAKE 1 TABLET(20 MG) BY MOUTH DAILY   vitamin B-12 (CYANOCOBALAMIN) 1000 MCG tablet Take 1,000 mcg by mouth daily.   No current facility-administered medications for this visit. (Other)      REVIEW OF SYSTEMS:    ALLERGIES No Known Allergies  PAST MEDICAL HISTORY Past Medical History:  Diagnosis Date   Coronary artery disease    stents placed 2003   Diabetes mellitus without complication (HCC)    Hyperlipidemia  Hypertension    Sleep apnea    on CPAP   Past Surgical History:  Procedure Laterality Date   BARIATRIC SURGERY  12/2015   BICEPS TENDON REPAIR     CARDIAC CATHETERIZATION     DRUG INDUCED ENDOSCOPY N/A 08/26/2020   Procedure: DRUG INDUCED ENDOSCOPY;  Surgeon: Osborn Coho, MD;  Location: [facility];  Service: ENT;  Laterality: N/A;   KNEE ARTHROSCOPY Bilateral    SHOULDER ARTHROSCOPY Bilateral    TONSILLECTOMY     UVULLA REMOVAL      FAMILY HISTORY Family History  Problem Relation Age of Onset   Diabetes Mother    Diabetes Father    Heart attack Father     SOCIAL HISTORY Social History   Tobacco Use   Smoking status: Never   Smokeless tobacco: Never  Vaping Use   Vaping Use: Never used  Substance Use Topics   Alcohol use: Not Currently    Alcohol/week: 0.0 standard drinks   Drug use: Never         OPHTHALMIC EXAM:  Base Eye Exam     Visual Acuity (ETDRS)        Right Left   Dist [place] 20/30 -2 20/200   Dist ph [place] 20/25         Tonometry (Tonopen, 8:46 AM)       Right Left   Pressure 12 13         Pupils       Pupils Dark Light Shape React APD   Right PERRL 4 3 Round Brisk None   Left PERRL 4 3 Round Brisk None         Visual Fields (Counting fingers)       Left Right    Full Full         Extraocular Movement       Right Left    Full Full         Neuro/Psych     Oriented x3: Yes   Mood/Affect: Normal         Dilation     Both eyes: 1.0% Mydriacyl, 2.5% Phenylephrine @ 8:46 AM           Slit Lamp and Fundus Exam     External Exam       Right Left   External Normal Normal         Slit Lamp Exam       Right Left   Lids/Lashes Normal Normal   Conjunctiva/Sclera White and quiet White and quiet   Cornea Clear Clear   Anterior Chamber Deep and quiet Deep and quiet   Iris Round and reactive Round and reactive   Lens Posterior chamber intraocular lens Posterior chamber intraocular lens   Anterior Vitreous Normal Normal         Fundus Exam       Right Left   Posterior Vitreous Normal Posterior vitreous detachment   Disc Normal Normal   C/D Ratio 0.65 0.65   Macula Microaneurysms , no clinically significant macular edema, Focal laser scars Microaneurysms, no macular thickening, no clinically significant macular edema, Focal laser scars   Vessels Severe NPDR Severe NPDR   Periphery Normal Normal            IMAGING AND PROCEDURES  Imaging and Procedures for 09/07/20  OCT, Retina - OU - Both Eyes       Right Eye Quality was good. Scan locations included subfoveal. Central Foveal Thickness: 276.  Progression has been stable. Findings include abnormal foveal contour.   Left Eye Quality was good. Scan locations included subfoveal. Central Foveal Thickness: 282. Progression has been stable. Findings include abnormal foveal contour.   Notes Focal area of thickening and CME temporally,  small and stable over time we will continue to observe OD  Similar OS no center involvement with good acuity             ASSESSMENT/PLAN:  Severe nonproliferative diabetic retinopathy of left eye without macular edema associated with type 2 diabetes mellitus (HCC) Mild CSME temporally, not center involved will observe  Severe nonproliferative diabetic retinopathy of right eye, with macular edema, associated with type 2 diabetes mellitus (HCC) Mild CSME temporally, observe  Obstructive sleep apnea Compliant on CPAP     ICD-10-CM   1. Severe nonproliferative diabetic retinopathy of right eye, with macular edema, associated with type 2 diabetes mellitus (HCC)  E11.3411 OCT, Retina - OU - Both Eyes    2. Severe nonproliferative diabetic retinopathy of left eye without macular edema associated with type 2 diabetes mellitus (HCC)  E11.3492 OCT, Retina - OU - Both Eyes    3. Obstructive sleep apnea  G47.33       1.  No progression of severe NPDR OU and minor CSME will continue to observe currently at 63-month interval.  We will dilate next in 4 months  2.  3.  Ophthalmic Meds Ordered this visit:  No orders of the defined types were placed in this encounter.      Return in about 4 months (around 01/07/2021) for DILATE OU, OCT.  There are no Patient Instructions on file for this visit.   Explained the diagnoses, plan, and follow up with the patient and they expressed understanding.  Patient expressed understanding of the importance of proper follow up care.   Alford Highland Jagger Demonte M.D. Diseases & Surgery of the Retina and Vitreous [facility] 09/07/20     Abbreviations: M myopia (nearsighted); A astigmatism; H hyperopia (farsighted); P presbyopia; Mrx spectacle prescription;  CTL contact lenses; OD right eye; OS left eye; OU both eyes  XT exotropia; ET esotropia; PEK punctate epithelial keratitis; PEE punctate epithelial erosions; DES dry eye syndrome; MGD  meibomian gland dysfunction; ATs artificial tears; PFAT's preservative free artificial tears; NSC nuclear sclerotic cataract; PSC posterior subcapsular cataract; ERM epi-retinal membrane; PVD posterior vitreous detachment; RD retinal detachment; DM diabetes mellitus; DR diabetic retinopathy; NPDR non-proliferative diabetic retinopathy; PDR proliferative diabetic retinopathy; CSME clinically significant macular edema; DME diabetic macular edema; dbh dot blot hemorrhages; CWS cotton wool spot; POAG primary open angle glaucoma; C/D cup-to-disc ratio; HVF humphrey visual field; GVF goldmann visual field; OCT optical coherence tomography; IOP intraocular pressure; BRVO Branch retinal vein occlusion; CRVO central retinal vein occlusion; CRAO central retinal artery occlusion; BRAO branch retinal artery occlusion; RT retinal tear; SB scleral buckle; PPV pars plana vitrectomy; VH Vitreous hemorrhage; PRP panretinal laser photocoagulation; IVK intravitreal kenalog; VMT vitreomacular traction; MH Macular hole;  NVD neovascularization of the disc; NVE neovascularization elsewhere; AREDS age related eye disease study; ARMD age related macular degeneration; POAG primary open angle glaucoma; EBMD epithelial/anterior basement membrane dystrophy; ACIOL anterior chamber intraocular lens; IOL intraocular lens; PCIOL posterior chamber intraocular lens; Phaco/IOL phacoemulsification with intraocular lens placement; PRK photorefractive keratectomy; LASIK laser assisted in situ keratomileusis; HTN hypertension; DM diabetes mellitus; COPD chronic obstructive pulmonary disease

## 2020-09-08 DIAGNOSIS — M9902 Segmental and somatic dysfunction of thoracic region: Secondary | ICD-10-CM | POA: Diagnosis not present

## 2020-09-08 DIAGNOSIS — S233XXA Sprain of ligaments of thoracic spine, initial encounter: Secondary | ICD-10-CM | POA: Diagnosis not present

## 2020-09-08 DIAGNOSIS — M9901 Segmental and somatic dysfunction of cervical region: Secondary | ICD-10-CM | POA: Diagnosis not present

## 2020-09-08 DIAGNOSIS — M9903 Segmental and somatic dysfunction of lumbar region: Secondary | ICD-10-CM | POA: Diagnosis not present

## 2020-09-08 DIAGNOSIS — S134XXA Sprain of ligaments of cervical spine, initial encounter: Secondary | ICD-10-CM | POA: Diagnosis not present

## 2020-09-08 DIAGNOSIS — M47816 Spondylosis without myelopathy or radiculopathy, lumbar region: Secondary | ICD-10-CM | POA: Diagnosis not present

## 2020-09-13 ENCOUNTER — Encounter (INDEPENDENT_AMBULATORY_CARE_PROVIDER_SITE_OTHER): Payer: PPO | Admitting: Ophthalmology

## 2020-09-20 ENCOUNTER — Encounter: Payer: Self-pay | Admitting: Cardiology

## 2020-09-20 DIAGNOSIS — E1143 Type 2 diabetes mellitus with diabetic autonomic (poly)neuropathy: Secondary | ICD-10-CM | POA: Diagnosis not present

## 2020-09-20 DIAGNOSIS — R5383 Other fatigue: Secondary | ICD-10-CM | POA: Diagnosis not present

## 2020-09-20 DIAGNOSIS — K760 Fatty (change of) liver, not elsewhere classified: Secondary | ICD-10-CM | POA: Diagnosis not present

## 2020-09-20 DIAGNOSIS — E782 Mixed hyperlipidemia: Secondary | ICD-10-CM | POA: Diagnosis not present

## 2020-09-20 DIAGNOSIS — E7849 Other hyperlipidemia: Secondary | ICD-10-CM | POA: Diagnosis not present

## 2020-09-22 DIAGNOSIS — M9901 Segmental and somatic dysfunction of cervical region: Secondary | ICD-10-CM | POA: Diagnosis not present

## 2020-09-22 DIAGNOSIS — S134XXA Sprain of ligaments of cervical spine, initial encounter: Secondary | ICD-10-CM | POA: Diagnosis not present

## 2020-09-22 DIAGNOSIS — M47816 Spondylosis without myelopathy or radiculopathy, lumbar region: Secondary | ICD-10-CM | POA: Diagnosis not present

## 2020-09-22 DIAGNOSIS — M9903 Segmental and somatic dysfunction of lumbar region: Secondary | ICD-10-CM | POA: Diagnosis not present

## 2020-09-22 DIAGNOSIS — S233XXA Sprain of ligaments of thoracic spine, initial encounter: Secondary | ICD-10-CM | POA: Diagnosis not present

## 2020-09-22 DIAGNOSIS — M9902 Segmental and somatic dysfunction of thoracic region: Secondary | ICD-10-CM | POA: Diagnosis not present

## 2020-09-26 DIAGNOSIS — I4891 Unspecified atrial fibrillation: Secondary | ICD-10-CM | POA: Diagnosis not present

## 2020-09-26 DIAGNOSIS — Z0001 Encounter for general adult medical examination with abnormal findings: Secondary | ICD-10-CM | POA: Diagnosis not present

## 2020-09-26 DIAGNOSIS — D692 Other nonthrombocytopenic purpura: Secondary | ICD-10-CM | POA: Diagnosis not present

## 2020-09-26 DIAGNOSIS — R238 Other skin changes: Secondary | ICD-10-CM | POA: Diagnosis not present

## 2020-09-26 DIAGNOSIS — E113313 Type 2 diabetes mellitus with moderate nonproliferative diabetic retinopathy with macular edema, bilateral: Secondary | ICD-10-CM | POA: Diagnosis not present

## 2020-09-26 DIAGNOSIS — E1143 Type 2 diabetes mellitus with diabetic autonomic (poly)neuropathy: Secondary | ICD-10-CM | POA: Diagnosis not present

## 2020-09-26 DIAGNOSIS — I10 Essential (primary) hypertension: Secondary | ICD-10-CM | POA: Diagnosis not present

## 2020-09-26 DIAGNOSIS — E7849 Other hyperlipidemia: Secondary | ICD-10-CM | POA: Diagnosis not present

## 2020-09-27 ENCOUNTER — Encounter: Payer: Self-pay | Admitting: Cardiology

## 2020-09-27 ENCOUNTER — Ambulatory Visit: Payer: PPO | Admitting: Cardiology

## 2020-09-27 ENCOUNTER — Other Ambulatory Visit: Payer: Self-pay

## 2020-09-27 VITALS — BP 110/70 | HR 82 | Ht 71.0 in | Wt 211.8 lb

## 2020-09-27 DIAGNOSIS — E782 Mixed hyperlipidemia: Secondary | ICD-10-CM

## 2020-09-27 DIAGNOSIS — F5112 Insufficient sleep syndrome: Secondary | ICD-10-CM | POA: Diagnosis not present

## 2020-09-27 DIAGNOSIS — G4733 Obstructive sleep apnea (adult) (pediatric): Secondary | ICD-10-CM | POA: Diagnosis not present

## 2020-09-27 DIAGNOSIS — E119 Type 2 diabetes mellitus without complications: Secondary | ICD-10-CM

## 2020-09-27 DIAGNOSIS — I2510 Atherosclerotic heart disease of native coronary artery without angina pectoris: Secondary | ICD-10-CM | POA: Diagnosis not present

## 2020-09-27 DIAGNOSIS — I10 Essential (primary) hypertension: Secondary | ICD-10-CM | POA: Diagnosis not present

## 2020-09-27 NOTE — Patient Instructions (Signed)

## 2020-09-27 NOTE — Progress Notes (Signed)
Clinical Summary [name] is a 65 y.o.male seen today for follow up of the following medical problems.   1. CAD - history of prior stent to LAD, with repeat balloon angioplsty of LAD stent in 2005 - last cath 2008 with patent stent and vessels.  - 07/2015 echo LVEF 60-65% and now WMAs. 08/2015 nuclear stress test without ischemia.        - no recent chest pain. No SOB/DOE - compliant withmeds - does ellipitical 35 min, no symptoms.     2. Hyperlipidemia - on crestor, vascepa    09/2020 TC 87 TG 117 HDL 37 LDL 29    3. HTN - he is compliant with meds      4. 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 Big [employer] fan   Had covid vaccine x 2.      Past Medical History:  Diagnosis Date   Coronary artery disease    stents placed 2003   Diabetes mellitus without complication (HCC)    Hyperlipidemia    Hypertension    Sleep apnea    on CPAP     No Known Allergies   Current Outpatient Medications  Medication Sig Dispense Refill   aspirin 81 MG tablet Take 81 mg by mouth daily.     b complex vitamins tablet Take 1 tablet by mouth daily.     Calcium 500-100 MG-UNIT CHEW Chew 1 tablet by mouth 4 (four) times daily.     empagliflozin (JARDIANCE) 25 MG TABS tablet Take 25 mg by mouth daily.     ergocalciferol (VITAMIN D2) 50000 units capsule Take 50,000 Units by mouth once a week.     ferrous sulfate 325 (65 FE) MG EC tablet Take 325 mg by mouth daily.     Icosapent Ethyl 1 g CAPS Take 2 capsules twice a day for triglycerides     Magnesium 250 MG TABS Take by mouth 2 (two) times daily.     metFORMIN (GLUCOPHAGE) 850 MG tablet Take 850 mg by mouth 2 (two) times daily with a meal.     metoprolol succinate (TOPROL-XL) 25 MG 24 hr tablet TAKE 1 TABLET BY MOUTH  DAILY 90 tablet 3   Multiple Vitamin (MULTIVITAMIN)  tablet Take 1 tablet by mouth 2 (two) times daily.     potassium chloride SA (K-DUR,KLOR-CON) 20 MEQ tablet Take 20 mEq by mouth 2 (two) times daily.      ramipril (ALTACE) 2.5 MG capsule TAKE 1 CAPSULE BY MOUTH  DAILY 90 capsule 3   rosuvastatin (CRESTOR) 20 MG tablet TAKE 1 TABLET(20 MG) BY MOUTH DAILY 90 tablet 2   vitamin B-12 (CYANOCOBALAMIN) 1000 MCG tablet Take 1,000 mcg by mouth daily.     No current facility-administered medications for this visit.     Past Surgical History:  Procedure Laterality Date   BARIATRIC SURGERY  12/2015   BICEPS TENDON REPAIR     CARDIAC CATHETERIZATION     DRUG INDUCED ENDOSCOPY N/A 08/26/2020   Procedure: DRUG INDUCED ENDOSCOPY;  Surgeon: Osborn Coho, MD;  Location: [facility];  Service: ENT;  Laterality: N/A;   KNEE ARTHROSCOPY Bilateral    SHOULDER ARTHROSCOPY Bilateral    TONSILLECTOMY     UVULLA REMOVAL       No Known Allergies  Family History  Problem Relation Age of Onset   Diabetes Mother    Diabetes Father    Heart attack Father      Social History Mr. Blansett reports that he has never smoked. He has never used smokeless tobacco. Mr. Jeansonne reports previous alcohol use.   Review of Systems CONSTITUTIONAL: No weight loss, fever, chills, weakness or fatigue.  HEENT: Eyes: No visual loss, blurred vision, double vision or yellow sclerae.No hearing loss, sneezing, congestion, runny nose or sore throat.  SKIN: No rash or itching.  CARDIOVASCULAR: per hpi RESPIRATORY: No shortness of breath, cough or sputum.  GASTROINTESTINAL: No anorexia, nausea, vomiting or diarrhea. No abdominal pain or blood.  GENITOURINARY: No burning on urination, no polyuria NEUROLOGICAL: No headache, dizziness, syncope, paralysis, ataxia, numbness or tingling in the extremities. No change in bowel or bladder control.  MUSCULOSKELETAL: No muscle, back pain, joint pain or stiffness.  LYMPHATICS: No enlarged nodes. No history of  splenectomy.  PSYCHIATRIC: No history of depression or anxiety.  ENDOCRINOLOGIC: No reports of sweating, cold or heat intolerance. No polyuria or polydipsia.  Marland Kitchen   Physical Examination Today's Vitals   09/27/20 1544  BP: 110/70  Pulse: 82  SpO2: 97%  Weight: 211 lb 12.8 oz (96.1 kg)  Height: 5\' 11"  (1.803 m)   Body mass index is 29.54 kg/m.  Gen: resting comfortably, no acute distress HEENT: no scleral icterus, pupils equal round and reactive, no palptable cervical adenopathy,  CV: RRR, no m/r/g, no jvd Resp: Clear to auscultation bilaterally GI: abdomen is soft, non-tender, non-distended, normal bowel sounds, no hepatosplenomegaly MSK: extremities are warm, no edema.  Skin: warm, no rash Neuro:  no focal deficits Psych: appropriate affect   Diagnostic Studies  02/2007 cath CONCLUSION:  1. Preserved left ventricular function with ejection fraction in      excess of 60%.  2. Continued patency of the stent site at the site of subsequent      cutting balloon angioplasty.  3. Scattered coronary irregularities as noted above.  4. Probable poor control of diabetes mellitus.    RECOMMENDATIONS:  We will likely recommend medical therapy.  We will  keep him overnight and recheck his enzymes.     07/2015 echo Study Conclusions   - Left ventricle: The cavity size was normal. Wall thickness was   increased in a pattern of moderate LVH. Systolic function was   normal. The estimated ejection fraction was in the range of 60%   to 65%. Wall motion was normal; there were no regional wall   motion abnormalities. Left ventricular diastolic function   parameters were normal. - Aortic valve: Valve area (VTI): 4.3 cm^2. Valve area (Vmax): 4.12   cm^2. Valve area (Vmean): 3.19 cm^2. - Technically adequate study.   08/2015 Nuclear stress test Blood pressure demonstrated a hypertensive response to exercise. There was no ST segment deviation noted during stress. Defect 1: There is a  small defect of mild severity present in the mid inferior, mid inferolateral and apical inferior location. This is consistent with soft tissue attenuation artifact. No areas of ischemia or infarction. This is a low risk study. Nuclear stress EF: 68%.   Assessment and Plan  1. CAD -no recent symptoms, continue current meds   2. Hyperlipidemia - at goal, continue current meds   3. HTN - at goal, continue current meds     4. DM2 -from cardiac standpoint continue statin, ACE-I   09/2015, M.D.

## 2020-09-28 ENCOUNTER — Other Ambulatory Visit (HOSPITAL_BASED_OUTPATIENT_CLINIC_OR_DEPARTMENT_OTHER): Payer: Self-pay

## 2020-09-28 DIAGNOSIS — G4733 Obstructive sleep apnea (adult) (pediatric): Secondary | ICD-10-CM

## 2020-10-06 DIAGNOSIS — M9901 Segmental and somatic dysfunction of cervical region: Secondary | ICD-10-CM | POA: Diagnosis not present

## 2020-10-06 DIAGNOSIS — M9903 Segmental and somatic dysfunction of lumbar region: Secondary | ICD-10-CM | POA: Diagnosis not present

## 2020-10-06 DIAGNOSIS — M47816 Spondylosis without myelopathy or radiculopathy, lumbar region: Secondary | ICD-10-CM | POA: Diagnosis not present

## 2020-10-06 DIAGNOSIS — M9902 Segmental and somatic dysfunction of thoracic region: Secondary | ICD-10-CM | POA: Diagnosis not present

## 2020-10-06 DIAGNOSIS — S233XXA Sprain of ligaments of thoracic spine, initial encounter: Secondary | ICD-10-CM | POA: Diagnosis not present

## 2020-10-06 DIAGNOSIS — S134XXA Sprain of ligaments of cervical spine, initial encounter: Secondary | ICD-10-CM | POA: Diagnosis not present

## 2020-10-07 DIAGNOSIS — E1129 Type 2 diabetes mellitus with other diabetic kidney complication: Secondary | ICD-10-CM | POA: Diagnosis not present

## 2020-10-07 DIAGNOSIS — E113313 Type 2 diabetes mellitus with moderate nonproliferative diabetic retinopathy with macular edema, bilateral: Secondary | ICD-10-CM | POA: Diagnosis not present

## 2020-10-07 DIAGNOSIS — K760 Fatty (change of) liver, not elsewhere classified: Secondary | ICD-10-CM | POA: Diagnosis not present

## 2020-10-07 DIAGNOSIS — E538 Deficiency of other specified B group vitamins: Secondary | ICD-10-CM | POA: Diagnosis not present

## 2020-10-07 DIAGNOSIS — E1159 Type 2 diabetes mellitus with other circulatory complications: Secondary | ICD-10-CM | POA: Diagnosis not present

## 2020-10-07 DIAGNOSIS — E1140 Type 2 diabetes mellitus with diabetic neuropathy, unspecified: Secondary | ICD-10-CM | POA: Diagnosis not present

## 2020-10-07 DIAGNOSIS — Z9884 Bariatric surgery status: Secondary | ICD-10-CM | POA: Diagnosis not present

## 2020-10-07 DIAGNOSIS — I10 Essential (primary) hypertension: Secondary | ICD-10-CM | POA: Diagnosis not present

## 2020-10-07 DIAGNOSIS — E785 Hyperlipidemia, unspecified: Secondary | ICD-10-CM | POA: Diagnosis not present

## 2020-10-07 DIAGNOSIS — Z7984 Long term (current) use of oral hypoglycemic drugs: Secondary | ICD-10-CM | POA: Diagnosis not present

## 2020-10-07 DIAGNOSIS — E559 Vitamin D deficiency, unspecified: Secondary | ICD-10-CM | POA: Diagnosis not present

## 2020-10-07 DIAGNOSIS — R809 Proteinuria, unspecified: Secondary | ICD-10-CM | POA: Diagnosis not present

## 2020-10-24 DIAGNOSIS — M47816 Spondylosis without myelopathy or radiculopathy, lumbar region: Secondary | ICD-10-CM | POA: Diagnosis not present

## 2020-10-24 DIAGNOSIS — S233XXA Sprain of ligaments of thoracic spine, initial encounter: Secondary | ICD-10-CM | POA: Diagnosis not present

## 2020-10-24 DIAGNOSIS — M9903 Segmental and somatic dysfunction of lumbar region: Secondary | ICD-10-CM | POA: Diagnosis not present

## 2020-10-24 DIAGNOSIS — M9901 Segmental and somatic dysfunction of cervical region: Secondary | ICD-10-CM | POA: Diagnosis not present

## 2020-10-24 DIAGNOSIS — M9902 Segmental and somatic dysfunction of thoracic region: Secondary | ICD-10-CM | POA: Diagnosis not present

## 2020-10-24 DIAGNOSIS — S134XXA Sprain of ligaments of cervical spine, initial encounter: Secondary | ICD-10-CM | POA: Diagnosis not present

## 2020-11-03 DIAGNOSIS — S134XXA Sprain of ligaments of cervical spine, initial encounter: Secondary | ICD-10-CM | POA: Diagnosis not present

## 2020-11-03 DIAGNOSIS — M47816 Spondylosis without myelopathy or radiculopathy, lumbar region: Secondary | ICD-10-CM | POA: Diagnosis not present

## 2020-11-03 DIAGNOSIS — M9902 Segmental and somatic dysfunction of thoracic region: Secondary | ICD-10-CM | POA: Diagnosis not present

## 2020-11-03 DIAGNOSIS — M9901 Segmental and somatic dysfunction of cervical region: Secondary | ICD-10-CM | POA: Diagnosis not present

## 2020-11-03 DIAGNOSIS — M9903 Segmental and somatic dysfunction of lumbar region: Secondary | ICD-10-CM | POA: Diagnosis not present

## 2020-11-03 DIAGNOSIS — S233XXA Sprain of ligaments of thoracic spine, initial encounter: Secondary | ICD-10-CM | POA: Diagnosis not present

## 2020-11-10 ENCOUNTER — Other Ambulatory Visit: Payer: Self-pay

## 2020-11-10 ENCOUNTER — Ambulatory Visit: Payer: PPO | Attending: Sleep Medicine | Admitting: Sleep Medicine

## 2020-11-10 DIAGNOSIS — G4733 Obstructive sleep apnea (adult) (pediatric): Secondary | ICD-10-CM | POA: Diagnosis not present

## 2020-11-17 DIAGNOSIS — S233XXA Sprain of ligaments of thoracic spine, initial encounter: Secondary | ICD-10-CM | POA: Diagnosis not present

## 2020-11-17 DIAGNOSIS — M47816 Spondylosis without myelopathy or radiculopathy, lumbar region: Secondary | ICD-10-CM | POA: Diagnosis not present

## 2020-11-17 DIAGNOSIS — S134XXA Sprain of ligaments of cervical spine, initial encounter: Secondary | ICD-10-CM | POA: Diagnosis not present

## 2020-11-17 DIAGNOSIS — M9903 Segmental and somatic dysfunction of lumbar region: Secondary | ICD-10-CM | POA: Diagnosis not present

## 2020-11-17 DIAGNOSIS — M9901 Segmental and somatic dysfunction of cervical region: Secondary | ICD-10-CM | POA: Diagnosis not present

## 2020-11-17 DIAGNOSIS — M9902 Segmental and somatic dysfunction of thoracic region: Secondary | ICD-10-CM | POA: Diagnosis not present

## 2020-11-20 NOTE — Procedures (Signed)
   NAME: [name] DATE OF BIRTH:  Dec 30, 1955 MEDICAL RECORD NUMBER [id number]  LOCATION: [facility]  PHYSICIAN: Crystel Demarco D Yuki Brunsman  DATE OF STUDY: 11/10/2020  SLEEP STUDY TYPE: Nocturnal Polysomnogram               REFERRING PHYSICIAN: Alanda Slim, MD  CLINICAL INFORMATION [name] is a 65 year [name] Male and was referred to the sleep center for evaluation of obstructive sleep apnea.  MEDICATIONS Patient self administered medications include: N/A. No sleep medicine administered.  SLEEP STUDY TECHNIQUE A multi-channel overnight Polysomnography study was performed. The channels recorded and monitored were central and occipital EEG, electrooculogram (EOG), submentalis EMG (chin), nasal and oral airflow, thoracic and abdominal wall motion, anterior tibialis EMG, snore microphone, electrocardiogram, and a pulse oximetry. TECHNICAL COMMENTS Comments added by Technician: Patient had difficulty initiating sleep. SLEEP ARCHITECTURE The study was initiated at 10:28:09 PM and terminated at 4:42:33 AM. The total recorded time was 374.4 minutes. EEG confirmed total sleep time was 204 minutes yielding a sleep efficiency of 54.5%. Sleep onset after lights out was 41.3 minutes with a REM latency of 98.5 minutes. The patient spent 15.93% of the night in stage N1 sleep, 59.31% in stage N2 sleep, 0.00% in stage N3 and 24.8% in REM. Wake after sleep onset (WASO) was 129.1 minutes. The Arousal Index was 18.8/hour. RESPIRATORY PARAMETERS There were a total of 46 respiratory disturbances out of which 4 were apneas (3 obstructive, 0 mixed, 1 central) and 42 hypopneas. The apnea/hypopnea index (AHI) was 13.5 events/hour. The central sleep apnea index was 0.3 events/hour. The REM AHI was 2.4 events/hour and NREM AHI was 17.2 events/hour. The supine AHI was 22.3 events/hour and the non supine AHI was 10.2 supine during 27.70% of sleep. Respiratory disturbances were associated with oxygen  desaturation down to a nadir of 86% during sleep. The mean oxygen saturation during the study was 93%.  LEG MOVEMENT DATA The total leg movements were 0 with a resulting leg movement index of 0/hr. Associated arousal with leg movement index was 0.0/hr.  CARDIAC DATA The underlying cardiac rhythm was most consistent with sinus rhythm. Mean heart rate during sleep was 57.97 bpm. Additional rhythm abnormalities include None. IMPRESSIONS - Mild to moderate obstructive sleep apnea (OSA) DIAGNOSIS - Obstructive Sleep Apnea (G47.33) RECOMMENDATIONS - Therapeutic CPAP titration to determine optimal pressure required to alleviate sleep disordered breathing. - Positional therapy avoiding supine position during sleep. - Avoid alcohol, sedatives and other CNS depressants that may worsen sleep apnea and disrupt normal sleep architecture. - Sleep hygiene should be reviewed to assess factors that may improve sleep quality. - Weight management and regular exercise should be initiated or continued.  Kimmora Risenhoover D Jeriko Kowalke Diplomate, American Board of Internal Medicine  ELECTRONICALLY SIGNED ON:  11/20/2020, 10:21 PM [facility] PH: [phone]   FX: [phone] ACCREDITED BY THE AMERICAN ACADEMY OF SLEEP MEDICINE

## 2020-11-22 DIAGNOSIS — R0981 Nasal congestion: Secondary | ICD-10-CM | POA: Diagnosis not present

## 2020-11-22 DIAGNOSIS — G4733 Obstructive sleep apnea (adult) (pediatric): Secondary | ICD-10-CM | POA: Diagnosis not present

## 2020-12-01 DIAGNOSIS — M9901 Segmental and somatic dysfunction of cervical region: Secondary | ICD-10-CM | POA: Diagnosis not present

## 2020-12-01 DIAGNOSIS — M9902 Segmental and somatic dysfunction of thoracic region: Secondary | ICD-10-CM | POA: Diagnosis not present

## 2020-12-01 DIAGNOSIS — E113213 Type 2 diabetes mellitus with mild nonproliferative diabetic retinopathy with macular edema, bilateral: Secondary | ICD-10-CM | POA: Diagnosis not present

## 2020-12-01 DIAGNOSIS — M9903 Segmental and somatic dysfunction of lumbar region: Secondary | ICD-10-CM | POA: Diagnosis not present

## 2020-12-01 DIAGNOSIS — M47816 Spondylosis without myelopathy or radiculopathy, lumbar region: Secondary | ICD-10-CM | POA: Diagnosis not present

## 2020-12-01 DIAGNOSIS — S134XXA Sprain of ligaments of cervical spine, initial encounter: Secondary | ICD-10-CM | POA: Diagnosis not present

## 2020-12-01 DIAGNOSIS — S233XXA Sprain of ligaments of thoracic spine, initial encounter: Secondary | ICD-10-CM | POA: Diagnosis not present

## 2020-12-01 DIAGNOSIS — H524 Presbyopia: Secondary | ICD-10-CM | POA: Diagnosis not present

## 2020-12-06 ENCOUNTER — Other Ambulatory Visit: Payer: Self-pay | Admitting: Otolaryngology

## 2020-12-15 DIAGNOSIS — M47816 Spondylosis without myelopathy or radiculopathy, lumbar region: Secondary | ICD-10-CM | POA: Diagnosis not present

## 2020-12-15 DIAGNOSIS — S134XXA Sprain of ligaments of cervical spine, initial encounter: Secondary | ICD-10-CM | POA: Diagnosis not present

## 2020-12-15 DIAGNOSIS — M9901 Segmental and somatic dysfunction of cervical region: Secondary | ICD-10-CM | POA: Diagnosis not present

## 2020-12-15 DIAGNOSIS — M9902 Segmental and somatic dysfunction of thoracic region: Secondary | ICD-10-CM | POA: Diagnosis not present

## 2020-12-15 DIAGNOSIS — S233XXA Sprain of ligaments of thoracic spine, initial encounter: Secondary | ICD-10-CM | POA: Diagnosis not present

## 2020-12-15 DIAGNOSIS — M9903 Segmental and somatic dysfunction of lumbar region: Secondary | ICD-10-CM | POA: Diagnosis not present

## 2020-12-28 DIAGNOSIS — G4733 Obstructive sleep apnea (adult) (pediatric): Secondary | ICD-10-CM | POA: Diagnosis not present

## 2020-12-29 DIAGNOSIS — S233XXA Sprain of ligaments of thoracic spine, initial encounter: Secondary | ICD-10-CM | POA: Diagnosis not present

## 2020-12-29 DIAGNOSIS — M47816 Spondylosis without myelopathy or radiculopathy, lumbar region: Secondary | ICD-10-CM | POA: Diagnosis not present

## 2020-12-29 DIAGNOSIS — M9903 Segmental and somatic dysfunction of lumbar region: Secondary | ICD-10-CM | POA: Diagnosis not present

## 2020-12-29 DIAGNOSIS — S134XXA Sprain of ligaments of cervical spine, initial encounter: Secondary | ICD-10-CM | POA: Diagnosis not present

## 2020-12-29 DIAGNOSIS — M9902 Segmental and somatic dysfunction of thoracic region: Secondary | ICD-10-CM | POA: Diagnosis not present

## 2020-12-29 DIAGNOSIS — M9901 Segmental and somatic dysfunction of cervical region: Secondary | ICD-10-CM | POA: Diagnosis not present

## 2021-01-06 DIAGNOSIS — Z9884 Bariatric surgery status: Secondary | ICD-10-CM | POA: Diagnosis not present

## 2021-01-06 DIAGNOSIS — E876 Hypokalemia: Secondary | ICD-10-CM | POA: Diagnosis not present

## 2021-01-06 DIAGNOSIS — I10 Essential (primary) hypertension: Secondary | ICD-10-CM | POA: Diagnosis not present

## 2021-01-06 DIAGNOSIS — D508 Other iron deficiency anemias: Secondary | ICD-10-CM | POA: Diagnosis not present

## 2021-01-06 DIAGNOSIS — R809 Proteinuria, unspecified: Secondary | ICD-10-CM | POA: Diagnosis not present

## 2021-01-06 DIAGNOSIS — E1159 Type 2 diabetes mellitus with other circulatory complications: Secondary | ICD-10-CM | POA: Diagnosis not present

## 2021-01-06 DIAGNOSIS — E559 Vitamin D deficiency, unspecified: Secondary | ICD-10-CM | POA: Diagnosis not present

## 2021-01-06 DIAGNOSIS — E1129 Type 2 diabetes mellitus with other diabetic kidney complication: Secondary | ICD-10-CM | POA: Diagnosis not present

## 2021-01-06 DIAGNOSIS — E538 Deficiency of other specified B group vitamins: Secondary | ICD-10-CM | POA: Diagnosis not present

## 2021-01-06 DIAGNOSIS — E1169 Type 2 diabetes mellitus with other specified complication: Secondary | ICD-10-CM | POA: Diagnosis not present

## 2021-01-06 DIAGNOSIS — E785 Hyperlipidemia, unspecified: Secondary | ICD-10-CM | POA: Diagnosis not present

## 2021-01-09 ENCOUNTER — Encounter (INDEPENDENT_AMBULATORY_CARE_PROVIDER_SITE_OTHER): Payer: Self-pay | Admitting: Ophthalmology

## 2021-01-09 ENCOUNTER — Other Ambulatory Visit: Payer: Self-pay

## 2021-01-09 ENCOUNTER — Ambulatory Visit (INDEPENDENT_AMBULATORY_CARE_PROVIDER_SITE_OTHER): Payer: PPO | Admitting: Ophthalmology

## 2021-01-09 DIAGNOSIS — E113492 Type 2 diabetes mellitus with severe nonproliferative diabetic retinopathy without macular edema, left eye: Secondary | ICD-10-CM

## 2021-01-09 DIAGNOSIS — E113411 Type 2 diabetes mellitus with severe nonproliferative diabetic retinopathy with macular edema, right eye: Secondary | ICD-10-CM

## 2021-01-09 DIAGNOSIS — G4733 Obstructive sleep apnea (adult) (pediatric): Secondary | ICD-10-CM | POA: Diagnosis not present

## 2021-01-09 NOTE — Assessment & Plan Note (Signed)
Chronic CSME, now controlled.  Residual temporal changes in the macula could represent ongoing sleep apnea type of damage yet the patient reports excellent compliance with CPAP use and confirmation on a daily basis of effective use.

## 2021-01-09 NOTE — Assessment & Plan Note (Signed)
Continues excellent compliance on CPAP, over the last 40 years  Scheduled next month to have the inspire implant

## 2021-01-09 NOTE — Assessment & Plan Note (Addendum)
Interval examination only upon confirmation at this does not worsen over the next 4 monthsCSME focally OD, has continued to be present but yet still overall much less active than prior.  No impact on acuity.  Some of these features in the right eye are chronic thus we will observe currently at 73-month interval and extend

## 2021-01-09 NOTE — Progress Notes (Signed)
01/09/2021     CHIEF COMPLAINT Patient presents for  Chief Complaint  Patient presents with   Retina Follow Up      HISTORY OF PRESENT ILLNESS: [name] is a 65 y.o. male who presents to the clinic today for:   HPI     Retina Follow Up   Patient presents with  Diabetic Retinopathy.  In both eyes.  This started 4 months ago.  Duration of 4 months.        Comments   4 month f/u OU with OCT  Pt states he doesn't seem to be able to see as well with his left eye as before.       Last edited by Frederik Pear, COA on 01/09/2021  8:05 AM.      Referring physician: Juliette Alcide, MD [address] [place],  [place] [address]  HISTORICAL INFORMATION:   Selected notes from the MEDICAL RECORD NUMBER    Lab Results  Component Value Date   HGBA1C (H) 02/19/2007    8.5 (NOTE)   The ADA recommends the following therapeutic goals for glycemic   control related to Hgb A1C measurement:   Goal of Therapy:   < 7.0% Hgb A1C   Action Suggested:  > 8.0% Hgb A1C   Ref:  Diabetes Care, 22, Suppl. 1, 1999     CURRENT MEDICATIONS: No current outpatient medications on file. (Ophthalmic Drugs)   No current facility-administered medications for this visit. (Ophthalmic Drugs)   Current Outpatient Medications (Other)  Medication Sig   aspirin 81 MG tablet Take 81 mg by mouth daily.   b complex vitamins tablet Take 1 tablet by mouth daily.   Calcium 500-100 MG-UNIT CHEW Chew 1 tablet by mouth 4 (four) times daily.   empagliflozin (JARDIANCE) 25 MG TABS tablet Take 25 mg by mouth daily.   ergocalciferol (VITAMIN D2) 50000 units capsule Take 50,000 Units by mouth once a week.   ferrous sulfate 325 (65 FE) MG EC tablet Take 325 mg by mouth daily.   icosapent Ethyl (VASCEPA) 1 g capsule Take 1 g by mouth 3 (three) times daily.   Magnesium 250 MG TABS Take by mouth 2 (two) times daily.   metFORMIN (GLUCOPHAGE) 850 MG tablet Take 850 mg by mouth 2 (two) times daily with a meal.    metoprolol succinate (TOPROL-XL) 25 MG 24 hr tablet TAKE 1 TABLET BY MOUTH  DAILY   Multiple Vitamin (MULTIVITAMIN) tablet Take 1 tablet by mouth 2 (two) times daily.   potassium chloride SA (K-DUR,KLOR-CON) 20 MEQ tablet Take 20 mEq by mouth 2 (two) times daily.    ramipril (ALTACE) 2.5 MG capsule TAKE 1 CAPSULE BY MOUTH  DAILY   rosuvastatin (CRESTOR) 20 MG tablet TAKE 1 TABLET(20 MG) BY MOUTH DAILY   vitamin B-12 (CYANOCOBALAMIN) 1000 MCG tablet Take 1,000 mcg by mouth daily.   No current facility-administered medications for this visit. (Other)      REVIEW OF SYSTEMS:    ALLERGIES No Known Allergies  PAST MEDICAL HISTORY Past Medical History:  Diagnosis Date   Coronary artery disease    stents placed 2003   Diabetes mellitus without complication (HCC)    Hyperlipidemia    Hypertension    Sleep apnea    on CPAP   Past Surgical History:  Procedure Laterality Date   BARIATRIC SURGERY  12/2015   BICEPS TENDON REPAIR     CARDIAC CATHETERIZATION     DRUG INDUCED ENDOSCOPY N/A  08/26/2020   Procedure: DRUG INDUCED ENDOSCOPY;  Surgeon: Osborn Coho, MD;  Location: [facility];  Service: ENT;  Laterality: N/A;   KNEE ARTHROSCOPY Bilateral    SHOULDER ARTHROSCOPY Bilateral    TONSILLECTOMY     UVULLA REMOVAL      FAMILY HISTORY Family History  Problem Relation Age of Onset   Diabetes Mother    Diabetes Father    Heart attack Father     SOCIAL HISTORY Social History   Tobacco Use   Smoking status: Never   Smokeless tobacco: Never  Vaping Use   Vaping Use: Never used  Substance Use Topics   Alcohol use: Not Currently    Alcohol/week: 0.0 standard drinks   Drug use: Never         OPHTHALMIC EXAM:  Base Eye Exam     Visual Acuity (ETDRS)       Right Left   Dist [place] 20/25 -1 20/50 -1   Dist ph [place]  20/20 -1         Tonometry (Tonopen, 8:11 AM)       Right Left   Pressure 7 10         Pupils       Pupils   Right PERRL    Left PERRL         Visual Fields (Counting fingers)       Left Right    Full Full         Extraocular Movement       Right Left    Full, Ortho Full, Ortho         Neuro/Psych     Oriented x3: Yes   Mood/Affect: Normal         Dilation     Both eyes: 1.0% Mydriacyl, 2.5% Phenylephrine @ 8:11 AM           Slit Lamp and Fundus Exam     External Exam       Right Left   External Normal Normal         Slit Lamp Exam       Right Left   Lids/Lashes Normal Normal   Conjunctiva/Sclera White and quiet White and quiet   Cornea Clear Clear   Anterior Chamber Deep and quiet Deep and quiet   Iris Round and reactive Round and reactive   Lens Posterior chamber intraocular lens Posterior chamber intraocular lens   Anterior Vitreous Normal Normal         Fundus Exam       Right Left   Posterior Vitreous Normal Posterior vitreous detachment   Disc Normal Normal   C/D Ratio 0.65 0.65   Macula Microaneurysms , no clinically significant macular edema, Focal laser scars Microaneurysms, no macular thickening, no clinically significant macular edema, Focal laser scars   Vessels Severe NPDR Severe NPDR   Periphery Normal Normal            IMAGING AND PROCEDURES  Imaging and Procedures for 01/09/21  OCT, Retina - OU - Both Eyes       Right Eye Quality was good. Scan locations included subfoveal. Central Foveal Thickness: 306. Progression has been stable. Findings include abnormal foveal contour.   Left Eye Quality was good. Scan locations included subfoveal. Central Foveal Thickness: 282. Progression has been stable. Findings include abnormal foveal contour.   Notes Focal area of thickening and CME temporally, small and stable over time we will continue to observe OD  Similar  OS no center involvement with good acuity             ASSESSMENT/PLAN:  Severe nonproliferative diabetic retinopathy of right eye, with macular edema, associated with  type 2 diabetes mellitus (HCC) Interval examination only upon confirmation at this does not worsen over the next 4 monthsCSME focally OD, has continued to be present but yet still overall much less active than prior.  No impact on acuity.  Some of these features in the right eye are chronic thus we will observe currently at 62-month interval and extend  Obstructive sleep apnea Continues excellent compliance on CPAP, over the last 40 years  Scheduled next month to have the inspire implant  Severe nonproliferative diabetic retinopathy of left eye without macular edema associated with type 2 diabetes mellitus (HCC) Chronic CSME, now controlled.  Residual temporal changes in the macula could represent ongoing sleep apnea type of damage yet the patient reports excellent compliance with CPAP use and confirmation on a daily basis of effective use.     ICD-10-CM   1. Severe nonproliferative diabetic retinopathy of right eye, with macular edema, associated with type 2 diabetes mellitus (HCC)  E11.3411 OCT, Retina - OU - Both Eyes    2. Obstructive sleep apnea  G47.33     3. Severe nonproliferative diabetic retinopathy of left eye without macular edema associated with type 2 diabetes mellitus (HCC)  U98.1191       1.  OU, minor CSME none center of fovea threatening and controlled after 4 months of observation will continue to observe.  2.  Notably patient have inspire implant planned for treatment of sleep apnea next month  3.  Ophthalmic Meds Ordered this visit:  No orders of the defined types were placed in this encounter.      Return in about 4 months (around 05/09/2021) for DILATE OU, COLOR FP, OCT.  There are no Patient Instructions on file for this visit.   Explained the diagnoses, plan, and follow up with the patient and they expressed understanding.  Patient expressed understanding of the importance of proper follow up care.   Alford Highland Tangia Pinard M.D. Diseases & Surgery of the Retina  and [facility] 01/09/21     Abbreviations: M myopia (nearsighted); A astigmatism; H hyperopia (farsighted); P presbyopia; Mrx spectacle prescription;  CTL contact lenses; OD right eye; OS left eye; OU both eyes  XT exotropia; ET esotropia; PEK punctate epithelial keratitis; PEE punctate epithelial erosions; DES dry eye syndrome; MGD meibomian gland dysfunction; ATs artificial tears; PFAT's preservative free artificial tears; NSC nuclear sclerotic cataract; PSC posterior subcapsular cataract; ERM epi-retinal membrane; PVD posterior vitreous detachment; RD retinal detachment; DM diabetes mellitus; DR diabetic retinopathy; NPDR non-proliferative diabetic retinopathy; PDR proliferative diabetic retinopathy; CSME clinically significant macular edema; DME diabetic macular edema; dbh dot blot hemorrhages; CWS cotton wool spot; POAG primary open angle glaucoma; C/D cup-to-disc ratio; HVF humphrey visual field; GVF goldmann visual field; OCT optical coherence tomography; IOP intraocular pressure; BRVO Branch retinal vein occlusion; CRVO central retinal vein occlusion; CRAO central retinal artery occlusion; BRAO branch retinal artery occlusion; RT retinal tear; SB scleral buckle; PPV pars plana vitrectomy; VH Vitreous hemorrhage; PRP panretinal laser photocoagulation; IVK intravitreal kenalog; VMT vitreomacular traction; MH Macular hole;  NVD neovascularization of the disc; NVE neovascularization elsewhere; AREDS age related eye disease study; ARMD age related macular degeneration; POAG primary open angle glaucoma; EBMD epithelial/anterior basement membrane dystrophy; ACIOL anterior chamber intraocular lens; IOL intraocular lens; PCIOL posterior  chamber intraocular lens; Phaco/IOL phacoemulsification with intraocular lens placement; PRK photorefractive keratectomy; LASIK laser assisted in situ keratomileusis; HTN hypertension; DM diabetes mellitus; COPD chronic obstructive pulmonary  disease

## 2021-01-10 DIAGNOSIS — I10 Essential (primary) hypertension: Secondary | ICD-10-CM | POA: Diagnosis not present

## 2021-01-10 DIAGNOSIS — Z7984 Long term (current) use of oral hypoglycemic drugs: Secondary | ICD-10-CM | POA: Diagnosis not present

## 2021-01-10 DIAGNOSIS — R809 Proteinuria, unspecified: Secondary | ICD-10-CM | POA: Diagnosis not present

## 2021-01-10 DIAGNOSIS — E1129 Type 2 diabetes mellitus with other diabetic kidney complication: Secondary | ICD-10-CM | POA: Diagnosis not present

## 2021-01-10 DIAGNOSIS — Z9884 Bariatric surgery status: Secondary | ICD-10-CM | POA: Diagnosis not present

## 2021-01-10 DIAGNOSIS — E1159 Type 2 diabetes mellitus with other circulatory complications: Secondary | ICD-10-CM | POA: Diagnosis not present

## 2021-01-10 DIAGNOSIS — E785 Hyperlipidemia, unspecified: Secondary | ICD-10-CM | POA: Diagnosis not present

## 2021-01-10 DIAGNOSIS — K760 Fatty (change of) liver, not elsewhere classified: Secondary | ICD-10-CM | POA: Diagnosis not present

## 2021-01-10 DIAGNOSIS — E1140 Type 2 diabetes mellitus with diabetic neuropathy, unspecified: Secondary | ICD-10-CM | POA: Diagnosis not present

## 2021-01-10 DIAGNOSIS — E113313 Type 2 diabetes mellitus with moderate nonproliferative diabetic retinopathy with macular edema, bilateral: Secondary | ICD-10-CM | POA: Diagnosis not present

## 2021-01-10 DIAGNOSIS — E559 Vitamin D deficiency, unspecified: Secondary | ICD-10-CM | POA: Diagnosis not present

## 2021-01-10 DIAGNOSIS — E538 Deficiency of other specified B group vitamins: Secondary | ICD-10-CM | POA: Diagnosis not present

## 2021-01-12 DIAGNOSIS — M9901 Segmental and somatic dysfunction of cervical region: Secondary | ICD-10-CM | POA: Diagnosis not present

## 2021-01-12 DIAGNOSIS — M47816 Spondylosis without myelopathy or radiculopathy, lumbar region: Secondary | ICD-10-CM | POA: Diagnosis not present

## 2021-01-12 DIAGNOSIS — S233XXA Sprain of ligaments of thoracic spine, initial encounter: Secondary | ICD-10-CM | POA: Diagnosis not present

## 2021-01-12 DIAGNOSIS — S134XXA Sprain of ligaments of cervical spine, initial encounter: Secondary | ICD-10-CM | POA: Diagnosis not present

## 2021-01-12 DIAGNOSIS — M9903 Segmental and somatic dysfunction of lumbar region: Secondary | ICD-10-CM | POA: Diagnosis not present

## 2021-01-12 DIAGNOSIS — M9902 Segmental and somatic dysfunction of thoracic region: Secondary | ICD-10-CM | POA: Diagnosis not present

## 2021-01-17 NOTE — Progress Notes (Signed)
Surgical Instructions    Your procedure is scheduled on 01/15/21.  Report to [facility] Main Entrance "A" at 6:30 A.M., then check in with the Admitting office.  Call this number if you have problems the morning of surgery:  [phone]   If you have any questions prior to your surgery date call [phone]: Open Monday-Friday 8am-4pm    Remember:  Do not eat or drink after midnight the night before your surgery      Take these medicines the morning of surgery with A SIP OF WATER  icosapent Ethyl (VASCEPA) metoprolol succinate (TOPROL-XL) rosuvastatin (CRESTOR)  As of today, STOP taking any Aspirin (unless otherwise instructed by your surgeon) Aleve, Naproxen, Ibuprofen, Motrin, Advil, Goody's, BC's, all herbal medications, fish oil, and all vitamins.  WHAT DO I DO ABOUT MY DIABETES MEDICATION?   Do not take oral diabetes medicines (pills) the morning of surgery.  THE DAY BEFORE SURGERY, do not take empagliflozin (JARDIANCE).       THE MORNING OF SURGERY, do not take empagliflozin (JARDIANCE) or metFORMIN (GLUCOPHAGE).  The day of surgery, do not take other diabetes injectables, including Byetta (exenatide), Bydureon (exenatide ER), Victoza (liraglutide), or Trulicity (dulaglutide).  If your CBG is greater than 220 mg/dL, you may take  of your sliding scale (correction) dose of insulin.   HOW TO MANAGE YOUR DIABETES BEFORE AND AFTER SURGERY  Why is it important to control my blood sugar before and after surgery? Improving blood sugar levels before and after surgery helps healing and can limit problems. A way of improving blood sugar control is eating a healthy diet by:  Eating less sugar and carbohydrates  Increasing activity/exercise  Talking with your doctor about reaching your blood sugar goals High blood sugars (greater than 180 mg/dL) can raise your risk of infections and slow your recovery, so you will need to focus on controlling your diabetes during the weeks  before surgery. Make sure that the doctor who takes care of your diabetes knows about your planned surgery including the date and location.  How do I manage my blood sugar before surgery? Check your blood sugar at least 4 times a day, starting 2 days before surgery, to make sure that the level is not too high or low.  Check your blood sugar the morning of your surgery when you wake up and every 2 hours until you get to the Short Stay unit.  If your blood sugar is less than 70 mg/dL, you will need to treat for low blood sugar: Do not take insulin. Treat a low blood sugar (less than 70 mg/dL) with  cup of clear juice (cranberry or apple), 4 glucose tablets, OR glucose gel. Recheck blood sugar in 15 minutes after treatment (to make sure it is greater than 70 mg/dL). If your blood sugar is not greater than 70 mg/dL on recheck, call [phone] for further instructions. Report your blood sugar to the short stay nurse when you get to Short Stay.  If you are admitted to the hospital after surgery: Your blood sugar will be checked by the staff and you will probably be given insulin after surgery (instead of oral diabetes medicines) to make sure you have good blood sugar levels. The goal for blood sugar control after surgery is 80-180 mg/dL.      After your COVID test   You are not required to quarantine however you are required to wear a well-fitting mask when you are out and around people not in your household.  If your mask becomes wet or soiled, replace with a new one.  Wash your hands often with soap and water for 20 seconds or clean your hands with an alcohol-based hand sanitizer that contains at least 60% alcohol.  Do not share personal items.  Notify your provider: if you are in close contact with someone who has COVID  or if you develop a fever of 100.4 or greater, sneezing, cough, sore throat, shortness of breath or body aches.             Do not wear jewelry or makeup Do not  wear lotions, powders, perfumes/colognes, or deodorant. Men may shave face and neck. Do not bring valuables to the hospital.  DO Not wear nail polish, gel polish, artificial nails, or any other type of covering on natural nails including finger and toenails. If patients have artificial nails, gel coating, etc. that need to be removed by a nail salon, please have this removed prior to surgery or surgery may need to be canceled/delayed if the surgeon/ anesthesia feels like the patient is unable to be adequately monitored.             [facility] is not responsible for any belongings or valuables.  Do NOT Smoke (Tobacco/Vaping)  24 hours prior to your procedure  If you use a CPAP at night, you may bring your mask for your overnight stay.   Contacts, glasses, hearing aids, dentures or partials may not be worn into surgery, please bring cases for these belongings   For patients admitted to the hospital, discharge time will be determined by your treatment team.   Patients discharged the day of surgery will not be allowed to drive home, and someone needs to stay with them for 24 hours.  NO VISITORS WILL BE ALLOWED IN PRE-OP WHERE PATIENTS ARE PREPPED FOR SURGERY.  ONLY 1 SUPPORT PERSON MAY BE PRESENT IN THE WAITING ROOM WHILE YOU ARE IN SURGERY.  IF YOU ARE TO BE ADMITTED, ONCE YOU ARE IN YOUR ROOM YOU WILL BE ALLOWED TWO (2) VISITORS. 1 (ONE) VISITOR MAY STAY OVERNIGHT BUT MUST ARRIVE TO THE ROOM BY 8pm.  Minor children may have two parents present. Special consideration for safety and communication needs will be reviewed on a case by case basis.  Special instructions:    Oral Hygiene is also important to reduce your risk of infection.  Remember - BRUSH YOUR TEETH THE MORNING OF SURGERY WITH YOUR REGULAR TOOTHPASTE   [facility]- Preparing For Surgery  Before surgery, you can play an important role. Because skin is not sterile, your skin needs to be as free of germs as possible. You can reduce  the number of germs on your skin by washing with CHG (chlorahexidine gluconate) Soap before surgery.  CHG is an antiseptic cleaner which kills germs and bonds with the skin to continue killing germs even after washing.     Please do not use if you have an allergy to CHG or antibacterial soaps. If your skin becomes reddened/irritated stop using the CHG.  Do not shave (including legs and underarms) for at least 48 hours prior to first CHG shower. It is OK to shave your face.  Please follow these instructions carefully.     Shower the NIGHT BEFORE SURGERY and the MORNING OF SURGERY with CHG Soap.   If you chose to wash your hair, wash your hair first as usual with your normal shampoo. After you shampoo, rinse your hair and body thoroughly to  remove the shampoo.  Then [employer] and genitals (private parts) with your normal soap and rinse thoroughly to remove soap.  After that Use CHG Soap as you would any other liquid soap. You can apply CHG directly to the skin and wash gently with a scrungie or a clean washcloth.   Apply the CHG Soap to your body ONLY FROM THE NECK DOWN.  Do not use on open wounds or open sores. Avoid contact with your eyes, ears, mouth and genitals (private parts). Wash Face and genitals (private parts)  with your normal soap.   Wash thoroughly, paying special attention to the area where your surgery will be performed.  Thoroughly rinse your body with warm water from the neck down.  DO NOT shower/wash with your normal soap after using and rinsing off the CHG Soap.  Pat yourself dry with a CLEAN TOWEL.  Wear CLEAN PAJAMAS to bed the night before surgery  Place CLEAN SHEETS on your bed the night before your surgery  DO NOT SLEEP WITH PETS.   Day of Surgery: Take a shower with CHG soap. Wear Clean/Comfortable clothing the morning of surgery Do not apply any deodorants/lotions.   Remember to brush your teeth WITH YOUR REGULAR TOOTHPASTE.   Please read over the  following fact sheets that you were given.

## 2021-01-18 ENCOUNTER — Other Ambulatory Visit: Payer: Self-pay

## 2021-01-18 ENCOUNTER — Encounter (HOSPITAL_COMMUNITY): Payer: Self-pay

## 2021-01-18 ENCOUNTER — Encounter (HOSPITAL_COMMUNITY)
Admission: RE | Admit: 2021-01-18 | Discharge: 2021-01-18 | Disposition: A | Discharge status: Home / Self Care | Payer: PPO | Source: Ambulatory Visit | Attending: Otolaryngology | Admitting: Otolaryngology

## 2021-01-18 VITALS — BP 137/77 | HR 84 | Temp 97.8°F | Resp 17 | Ht 71.0 in | Wt 218.7 lb

## 2021-01-18 DIAGNOSIS — I10 Essential (primary) hypertension: Secondary | ICD-10-CM | POA: Diagnosis not present

## 2021-01-18 DIAGNOSIS — Z01812 Encounter for preprocedural laboratory examination: Secondary | ICD-10-CM | POA: Insufficient documentation

## 2021-01-18 DIAGNOSIS — E119 Type 2 diabetes mellitus without complications: Secondary | ICD-10-CM | POA: Diagnosis not present

## 2021-01-18 DIAGNOSIS — I2510 Atherosclerotic heart disease of native coronary artery without angina pectoris: Secondary | ICD-10-CM | POA: Diagnosis not present

## 2021-01-18 DIAGNOSIS — Z01818 Encounter for other preprocedural examination: Secondary | ICD-10-CM

## 2021-01-18 HISTORY — DX: Unspecified osteoarthritis, unspecified site: M19.90

## 2021-01-18 LAB — BASIC METABOLIC PANEL
Anion gap: 9 (ref 5–15)
BUN: 14 mg/dL (ref 8–23)
CO2: 22 mmol/L (ref 22–32)
Calcium: 8.3 mg/dL — ABNORMAL LOW (ref 8.9–10.3)
Chloride: 109 mmol/L (ref 98–111)
Creatinine, Ser: 0.83 mg/dL (ref 0.61–1.24)
GFR, Estimated: >60 mL/min (ref >60)
Glucose, Bld: 159 mg/dL — ABNORMAL HIGH (ref 70–99)
Potassium: 3.7 mmol/L (ref 3.5–5.1)
Sodium: 140 mmol/L (ref 135–145)

## 2021-01-18 LAB — CBC
HCT: 37.8 % — ABNORMAL LOW (ref 39.0–52.0)
Hemoglobin: 12 g/dL — ABNORMAL LOW (ref 13.0–17.0)
MCH: 28.8 pg (ref 26.0–34.0)
MCHC: 31.7 g/dL (ref 30.0–36.0)
MCV: 90.9 fL (ref 80.0–100.0)
Platelets: 159 10*3/uL (ref 150–400)
RBC: 4.16 MIL/uL — ABNORMAL LOW (ref 4.22–5.81)
RDW: 15.5 % (ref 11.5–15.5)
WBC: 8.5 10*3/uL (ref 4.0–10.5)
nRBC: 0 % (ref 0.0–0.2)

## 2021-01-18 LAB — GLUCOSE, CAPILLARY: Glucose-Capillary: 139 mg/dL — ABNORMAL HIGH (ref 70–99)

## 2021-01-18 NOTE — Progress Notes (Signed)
PCP - [facility] [place] Cardiologist - Storm Frisk in [place]  PPM/ICD - denies   Chest x-ray - n/a EKG - 08/19/20 Stress Test - 08/18/15 ECHO - 08/04/15 Cardiac Cath - 2000, records requested (done at [facility] but records are not in Epic)  Sleep Study - +OSA CPAP - yes, wears nightly  Fasting Blood Sugar - 110s Checks Blood Sugar once a day  As of today, STOP taking any Aspirin (unless otherwise instructed by your surgeon) Aleve, Naproxen, Ibuprofen, Motrin, Advil, Goody's, BC's, all herbal medications, fish oil, and all vitamins.  ERAS Protcol -no   COVID TEST- ambulatory surgery   Anesthesia review: yes, records requested from cath (done [facility] but records are not in Epic). Cardiac history followed by Storm Frisk  Patient denies shortness of breath, fever, cough and chest pain at PAT appointment   All instructions explained to the patient, with a verbal understanding of the material. Patient agrees to go over the instructions while at home for a better understanding. Patient also instructed to self quarantine after being tested for COVID-19. The opportunity to ask questions was provided.

## 2021-01-19 NOTE — Progress Notes (Signed)
Anesthesia Chart Review:  Follows with cardiology for history of CAD s/p stent to LAD with repeat balloon angioplasty of LAD stent in 2005.  Last cath 2008 with patent stents and vessels.  Echo 07/2015 LVEF 60 to 65% no RWMA, nuclear stress 08/2015 nonischemic.  Last seen by Dr. Allyson Sabal 09/27/2020, stable at that time, recommend continuing current medical therapy.  DM2 well-controlled, A1c 5.6 on 12/29/2020.  Preop labs reviewed, unremarkable.  EKG 08/19/2020: Normal sinus rhythm.  Rate 67.  Incomplete right bundle branch block.   07/2015 echo Study Conclusions   - Left ventricle: The cavity size was normal. Wall thickness was   increased in a pattern of moderate LVH. Systolic function was   normal. The estimated ejection fraction was in the range of 60%   to 65%. Wall motion was normal; there were no regional wall   motion abnormalities. Left ventricular diastolic function   parameters were normal. - Aortic valve: Valve area (VTI): 4.3 cm^2. Valve area (Vmax): 4.12   cm^2. Valve area (Vmean): 3.19 cm^2. - Technically adequate study.   08/2015 Nuclear stress test Blood pressure demonstrated a hypertensive response to exercise. There was no ST segment deviation noted during stress. Defect 1: There is a small defect of mild severity present in the mid inferior, mid inferolateral and apical inferior location. This is consistent with soft tissue attenuation artifact. No areas of ischemia or infarction. This is a low risk study. Nuclear stress EF: 68%.   02/2007 cath CONCLUSION:  1. Preserved left ventricular function with ejection fraction in      excess of 60%.  2. Continued patency of the stent site at the site of subsequent      cutting balloon angioplasty.  3. Scattered coronary irregularities as noted above.  4. Probable poor control of diabetes mellitus.    RECOMMENDATIONS:  We will likely recommend medical therapy.  We will  keep him overnight and recheck his enzymes.     Zannie Cove [facility] Short Stay Center/Anesthesiology Phone [phone] 01/19/2021 1:49 PM

## 2021-01-19 NOTE — Anesthesia Preprocedure Evaluation (Addendum)
Anesthesia Evaluation  Patient identified by MRN, date of birth, ID band Patient awake    Reviewed: Allergy & Precautions, NPO status , Patient's Chart, lab work & pertinent test results  Airway Mallampati: III  TM Distance: >3 FB Neck ROM: Full    Dental no notable dental hx.    Pulmonary sleep apnea and Continuous Positive Airway Pressure Ventilation ,    Pulmonary exam normal        Cardiovascular hypertension, Pt. on medications and Pt. on home beta blockers + CAD and + Cardiac Stents   Rhythm:Regular Rate:Normal     Neuro/Psych negative neurological ROS  negative psych ROS   GI/Hepatic negative GI ROS, Neg liver ROS,   Endo/Other  diabetes, Type 2, Oral Hypoglycemic Agents  Renal/GU negative Renal ROS  negative genitourinary   Musculoskeletal  (+) Arthritis , Osteoarthritis,    Abdominal Normal abdominal exam  (+)   Peds  Hematology negative hematology ROS (+)   Anesthesia Other Findings   Reproductive/Obstetrics                           Anesthesia Physical Anesthesia Plan  ASA: 3  Anesthesia Plan: General   Post-op Pain Management:    Induction: Intravenous  PONV Risk Score and Plan: 2 and Ondansetron, Dexamethasone and Treatment may vary due to age or medical condition  Airway Management Planned: Mask and Oral ETT  Additional Equipment: None  Intra-op Plan:   Post-operative Plan:   Informed Consent: I have reviewed the patients History and Physical, chart, labs and discussed the procedure including the risks, benefits and alternatives for the proposed anesthesia with the patient or authorized representative who has indicated his/her understanding and acceptance.     Dental advisory given  Plan Discussed with: CRNA  Anesthesia Plan Comments: (Lab Results      Component                Value               Date                      WBC                      8.5                  01/18/2021                HGB                      12.0 (L)            01/18/2021                HCT                      37.8 (L)            01/18/2021                MCV                      90.9                01/18/2021                PLT  159                 01/18/2021           Lab Results      Component                Value               Date                      NA                       140                 01/18/2021                K                        3.7                 01/18/2021                CO2                      22                  01/18/2021                GLUCOSE                  159 (H)             01/18/2021                BUN                      14                  01/18/2021                CREATININE               0.83                01/18/2021                CALCIUM                  8.3 (L)             01/18/2021                GFRNONAA                 >60                 01/18/2021            PAT note by Antionette Poles, PA-C: Follows with cardiology for history of CAD s/p stent to LAD with repeat balloon angioplasty of LAD stent in 2005.  Last cath 2008 with patent stents and vessels.  Echo 07/2015 LVEF 60 to 65% no RWMA, nuclear stress 08/2015 nonischemic.  Last seen by Dr. Allyson Sabal 09/27/2020, stable at that time, recommend continuing current medical therapy.  DM2 well-controlled, A1c 5.6 on 12/29/2020.  Preop labs reviewed, unremarkable.  EKG 08/19/2020: Normal sinus rhythm.  Rate 67.  Incomplete right bundle branch block.  07/2015 echo Study Conclusions  -  Left ventricle: The cavity size was normal. Wall thickness was increased in a pattern of moderate LVH. Systolic function was normal. The estimated ejection fraction was in the range of 60% to 65%. Wall motion was normal; there were no regional wall motion abnormalities. Left ventricular diastolic function parameters were normal. - Aortic valve: Valve area (VTI): 4.3  cm^2. Valve area (Vmax): 4.12 cm^2. Valve area (Vmean): 3.19 cm^2. - Technically adequate study.  08/2015 Nuclear stress test . Blood pressure demonstrated a hypertensive response to exercise. . There was no ST segment deviation noted during stress. . Defect 1: There is a small defect of mild severity present in the mid inferior, mid inferolateral and apical inferior location. This is consistent with soft tissue attenuation artifact. No areas of ischemia or infarction. . This is a low risk study. . Nuclear stress EF: 68%.  02/2007 cath CONCLUSION: 1. Preserved left ventricular function with ejection fraction in excess of 60%. 2. Continued patency of the stent site at the site of subsequent cutting balloon angioplasty. 3. Scattered coronary irregularities as noted above. 4. Probable poor control of diabetes mellitus.  RECOMMENDATIONS:We will likely recommend medical therapy.We will keep him overnight and recheck his enzymes.  )      Anesthesia Quick Evaluation

## 2021-01-24 DIAGNOSIS — M47816 Spondylosis without myelopathy or radiculopathy, lumbar region: Secondary | ICD-10-CM | POA: Diagnosis not present

## 2021-01-24 DIAGNOSIS — M9903 Segmental and somatic dysfunction of lumbar region: Secondary | ICD-10-CM | POA: Diagnosis not present

## 2021-01-24 DIAGNOSIS — S134XXA Sprain of ligaments of cervical spine, initial encounter: Secondary | ICD-10-CM | POA: Diagnosis not present

## 2021-01-24 DIAGNOSIS — S233XXA Sprain of ligaments of thoracic spine, initial encounter: Secondary | ICD-10-CM | POA: Diagnosis not present

## 2021-01-24 DIAGNOSIS — M9902 Segmental and somatic dysfunction of thoracic region: Secondary | ICD-10-CM | POA: Diagnosis not present

## 2021-01-24 DIAGNOSIS — M9901 Segmental and somatic dysfunction of cervical region: Secondary | ICD-10-CM | POA: Diagnosis not present

## 2021-01-25 ENCOUNTER — Encounter (HOSPITAL_COMMUNITY)
Admission: RE | Disposition: A | Discharge status: Home / Self Care | Payer: Self-pay | Source: Home / Self Care | Attending: Otolaryngology

## 2021-01-25 ENCOUNTER — Ambulatory Visit (HOSPITAL_COMMUNITY): Payer: PPO | Admitting: Physician Assistant

## 2021-01-25 ENCOUNTER — Ambulatory Visit (HOSPITAL_COMMUNITY): Payer: PPO

## 2021-01-25 ENCOUNTER — Other Ambulatory Visit: Payer: Self-pay

## 2021-01-25 ENCOUNTER — Ambulatory Visit (HOSPITAL_COMMUNITY)
Admission: RE | Admit: 2021-01-25 | Discharge: 2021-01-25 | Disposition: A | Discharge status: Home / Self Care | Payer: PPO | Attending: Otolaryngology | Admitting: Otolaryngology

## 2021-01-25 ENCOUNTER — Encounter (HOSPITAL_COMMUNITY): Payer: Self-pay | Admitting: Otolaryngology

## 2021-01-25 ENCOUNTER — Ambulatory Visit (HOSPITAL_COMMUNITY): Payer: PPO | Admitting: Anesthesiology

## 2021-01-25 DIAGNOSIS — I2510 Atherosclerotic heart disease of native coronary artery without angina pectoris: Secondary | ICD-10-CM | POA: Diagnosis not present

## 2021-01-25 DIAGNOSIS — G4733 Obstructive sleep apnea (adult) (pediatric): Secondary | ICD-10-CM | POA: Insufficient documentation

## 2021-01-25 DIAGNOSIS — Z955 Presence of coronary angioplasty implant and graft: Secondary | ICD-10-CM | POA: Diagnosis not present

## 2021-01-25 DIAGNOSIS — Z9884 Bariatric surgery status: Secondary | ICD-10-CM | POA: Insufficient documentation

## 2021-01-25 DIAGNOSIS — Z7984 Long term (current) use of oral hypoglycemic drugs: Secondary | ICD-10-CM | POA: Insufficient documentation

## 2021-01-25 DIAGNOSIS — Z9989 Dependence on other enabling machines and devices: Secondary | ICD-10-CM | POA: Insufficient documentation

## 2021-01-25 DIAGNOSIS — Z969 Presence of functional implant, unspecified: Secondary | ICD-10-CM | POA: Diagnosis not present

## 2021-01-25 DIAGNOSIS — R918 Other nonspecific abnormal finding of lung field: Secondary | ICD-10-CM | POA: Diagnosis not present

## 2021-01-25 DIAGNOSIS — E785 Hyperlipidemia, unspecified: Secondary | ICD-10-CM | POA: Insufficient documentation

## 2021-01-25 DIAGNOSIS — Z79899 Other long term (current) drug therapy: Secondary | ICD-10-CM | POA: Diagnosis not present

## 2021-01-25 DIAGNOSIS — I10 Essential (primary) hypertension: Secondary | ICD-10-CM | POA: Insufficient documentation

## 2021-01-25 DIAGNOSIS — Z09 Encounter for follow-up examination after completed treatment for conditions other than malignant neoplasm: Secondary | ICD-10-CM

## 2021-01-25 DIAGNOSIS — E119 Type 2 diabetes mellitus without complications: Secondary | ICD-10-CM | POA: Insufficient documentation

## 2021-01-25 DIAGNOSIS — Z6829 Body mass index (BMI) 29.0-29.9, adult: Secondary | ICD-10-CM | POA: Insufficient documentation

## 2021-01-25 DIAGNOSIS — Z6826 Body mass index (BMI) 26.0-26.9, adult: Secondary | ICD-10-CM | POA: Diagnosis not present

## 2021-01-25 DIAGNOSIS — Z789 Other specified health status: Secondary | ICD-10-CM | POA: Insufficient documentation

## 2021-01-25 DIAGNOSIS — M47812 Spondylosis without myelopathy or radiculopathy, cervical region: Secondary | ICD-10-CM | POA: Diagnosis not present

## 2021-01-25 HISTORY — PX: IMPLANTATION OF HYPOGLOSSAL NERVE STIMULATOR: SHX6827

## 2021-01-25 LAB — GLUCOSE, CAPILLARY
Glucose-Capillary: 146 mg/dL — ABNORMAL HIGH (ref 70–99)
Glucose-Capillary: 184 mg/dL — ABNORMAL HIGH (ref 70–99)

## 2021-01-25 SURGERY — INSERTION, HYPOGLOSSAL NERVE STIMULATOR
Anesthesia: General | Site: Chest | Laterality: Right

## 2021-01-25 MED ORDER — ACETAMINOPHEN 10 MG/ML IV SOLN: 1000.0000 mg | Freq: Once | INTRAVENOUS | Status: DC | PRN

## 2021-01-25 MED ORDER — LIDOCAINE-EPINEPHRINE 1 %-1:100000 IJ SOLN
INTRAMUSCULAR | Status: DC | PRN
  Administered 2021-01-25: 7 mL

## 2021-01-25 MED ORDER — ONDANSETRON HCL 4 MG/2ML IJ SOLN
INTRAMUSCULAR | Status: DC | PRN
  Administered 2021-01-25: 4 mg via INTRAVENOUS

## 2021-01-25 MED ORDER — MIDAZOLAM HCL 2 MG/2ML IJ SOLN
INTRAMUSCULAR | Status: DC | PRN
  Administered 2021-01-25: 2 mg via INTRAVENOUS

## 2021-01-25 MED ORDER — PHENYLEPHRINE HCL-NACL 20-0.9 MG/250ML-% IV SOLN
INTRAVENOUS | Status: DC | PRN
  Administered 2021-01-25: 25 ug/min via INTRAVENOUS

## 2021-01-25 MED ORDER — PROPOFOL 1000 MG/100ML IV EMUL
INTRAVENOUS | Status: AC
  Filled 2021-01-25: qty 100

## 2021-01-25 MED ORDER — PROPOFOL 10 MG/ML IV BOLUS
INTRAVENOUS | Status: DC | PRN
  Administered 2021-01-25: 200 mg via INTRAVENOUS

## 2021-01-25 MED ORDER — FENTANYL CITRATE (PF) 100 MCG/2ML IJ SOLN: 25.0000 ug | INTRAMUSCULAR | Status: DC | PRN

## 2021-01-25 MED ORDER — PHENYLEPHRINE 40 MCG/ML (10ML) SYRINGE FOR IV PUSH (FOR BLOOD PRESSURE SUPPORT)
PREFILLED_SYRINGE | INTRAVENOUS | Status: DC | PRN
  Administered 2021-01-25: 160 ug via INTRAVENOUS
  Administered 2021-01-25: 120 ug via INTRAVENOUS

## 2021-01-25 MED ORDER — LIDOCAINE 2% (20 MG/ML) 5 ML SYRINGE
INTRAMUSCULAR | Status: DC | PRN
  Administered 2021-01-25: 60 mg via INTRAVENOUS

## 2021-01-25 MED ORDER — LACTATED RINGERS IV SOLN: INTRAVENOUS | Status: DC

## 2021-01-25 MED ORDER — PROPOFOL 500 MG/50ML IV EMUL
INTRAVENOUS | Status: DC | PRN
  Administered 2021-01-25: 150 ug/kg/min via INTRAVENOUS

## 2021-01-25 MED ORDER — ONDANSETRON HCL 4 MG/2ML IJ SOLN
INTRAMUSCULAR | Status: AC
  Filled 2021-01-25: qty 2

## 2021-01-25 MED ORDER — ORAL CARE MOUTH RINSE: 15.0000 mL | Freq: Once | OROMUCOSAL | Status: AC

## 2021-01-25 MED ORDER — SUCCINYLCHOLINE CHLORIDE 200 MG/10ML IV SOSY
PREFILLED_SYRINGE | INTRAVENOUS | Status: DC | PRN
  Administered 2021-01-25: 120 mg via INTRAVENOUS

## 2021-01-25 MED ORDER — MIDAZOLAM HCL 2 MG/2ML IJ SOLN
INTRAMUSCULAR | Status: AC
  Filled 2021-01-25: qty 2

## 2021-01-25 MED ORDER — DEXAMETHASONE SODIUM PHOSPHATE 10 MG/ML IJ SOLN
INTRAMUSCULAR | Status: AC
  Filled 2021-01-25: qty 1

## 2021-01-25 MED ORDER — PROPOFOL 10 MG/ML IV BOLUS
INTRAVENOUS | Status: AC
  Filled 2021-01-25: qty 20

## 2021-01-25 MED ORDER — LIDOCAINE-EPINEPHRINE 1 %-1:100000 IJ SOLN
INTRAMUSCULAR | Status: AC
  Filled 2021-01-25: qty 1

## 2021-01-25 MED ORDER — CHLORHEXIDINE GLUCONATE 0.12 % MT SOLN
15.0000 mL | Freq: Once | OROMUCOSAL | Status: AC
  Administered 2021-01-25: 15 mL via OROMUCOSAL
  Filled 2021-01-25: qty 15

## 2021-01-25 MED ORDER — FENTANYL CITRATE (PF) 100 MCG/2ML IJ SOLN
INTRAMUSCULAR | Status: DC | PRN
  Administered 2021-01-25 (×3): 50 ug via INTRAVENOUS

## 2021-01-25 MED ORDER — HYDROCODONE-ACETAMINOPHEN 5-325 MG PO TABS: ORAL_TABLET | ORAL | 0 refills | Status: DC

## 2021-01-25 MED ORDER — LIDOCAINE 2% (20 MG/ML) 5 ML SYRINGE
INTRAMUSCULAR | Status: AC
  Filled 2021-01-25: qty 5

## 2021-01-25 MED ORDER — 0.9 % SODIUM CHLORIDE (POUR BTL) OPTIME
TOPICAL | Status: DC | PRN
  Administered 2021-01-25: 1000 mL

## 2021-01-25 MED ORDER — DEXAMETHASONE SODIUM PHOSPHATE 10 MG/ML IJ SOLN
INTRAMUSCULAR | Status: DC | PRN
  Administered 2021-01-25: 5 mg via INTRAVENOUS

## 2021-01-25 MED ORDER — FENTANYL CITRATE (PF) 250 MCG/5ML IJ SOLN
INTRAMUSCULAR | Status: AC
  Filled 2021-01-25: qty 5

## 2021-01-25 MED ORDER — CEFAZOLIN SODIUM-DEXTROSE 2-4 GM/100ML-% IV SOLN
2.0000 g | INTRAVENOUS | Status: AC
  Administered 2021-01-25: 2 g via INTRAVENOUS
  Filled 2021-01-25: qty 100

## 2021-01-25 SURGICAL SUPPLY — 61 items
ADH SKN CLS APL DERMABOND .7 (GAUZE/BANDAGES/DRESSINGS) ×2
BLADE CLIPPER SURG (BLADE) ×3 IMPLANT
BLADE SURG 15 STRL LF DISP TIS (BLADE) ×3 IMPLANT
BLADE SURG 15 STRL SS (BLADE) ×9
CANISTER SUCT 3000ML PPV (MISCELLANEOUS) ×3 IMPLANT
CORD BIPOLAR FORCEPS 12FT (ELECTRODE) ×3 IMPLANT
COVER PROBE W GEL 5X96 (DRAPES) ×3 IMPLANT
COVER SURGICAL LIGHT HANDLE (MISCELLANEOUS) ×3 IMPLANT
DERMABOND ADVANCED (GAUZE/BANDAGES/DRESSINGS) ×4
DERMABOND ADVANCED .7 DNX12 (GAUZE/BANDAGES/DRESSINGS) ×2 IMPLANT
DRAPE C-ARM 35X43 STRL (DRAPES) ×3 IMPLANT
DRAPE HEAD BAR (DRAPES) ×3 IMPLANT
DRAPE INCISE IOBAN 66X45 STRL (DRAPES) ×3 IMPLANT
DRAPE MICROSCOPE LEICA 54X105 (DRAPES) ×3 IMPLANT
DRAPE UTILITY XL STRL (DRAPES) ×3 IMPLANT
DRSG TEGADERM 2-3/8X2-3/4 SM (GAUZE/BANDAGES/DRESSINGS) ×6 IMPLANT
ELECT COATED BLADE 2.86 ST (ELECTRODE) ×3 IMPLANT
ELECT EMG 18 NIMS (NEUROSURGERY SUPPLIES) ×3
ELECT PAIRED SUBDERMAL (MISCELLANEOUS) ×3
ELECTRODE EMG 18 NIMS (NEUROSURGERY SUPPLIES) ×1 IMPLANT
ELECTRODE PAIRED SUBDERMAL (MISCELLANEOUS) ×1 IMPLANT
FORCEPS BIPOLAR SPETZLER 8 1.0 (NEUROSURGERY SUPPLIES) ×3 IMPLANT
GAUZE 4X4 16PLY ~~LOC~~+RFID DBL (SPONGE) ×3 IMPLANT
GAUZE SPONGE 4X4 12PLY STRL (GAUZE/BANDAGES/DRESSINGS) ×6 IMPLANT
GENERATOR PULSE INSPIRE (Generator) ×3 IMPLANT
GLOVE SURG ENC TEXT LTX SZ7 (GLOVE) ×6 IMPLANT
GOWN STRL REUS W/ TWL LRG LVL3 (GOWN DISPOSABLE) ×2 IMPLANT
GOWN STRL REUS W/TWL LRG LVL3 (GOWN DISPOSABLE) ×6
KIT BASIN OR (CUSTOM PROCEDURE TRAY) ×3 IMPLANT
KIT TURNOVER KIT B (KITS) ×3 IMPLANT
LEAD SENSING RESP INSPIRE (Lead) ×3 IMPLANT
LEAD SLEEP STIMULATION INSPIRE (Lead) ×3 IMPLANT
LOOP VESSEL MAXI BLUE (MISCELLANEOUS) ×3 IMPLANT
LOOP VESSEL MINI RED (MISCELLANEOUS) ×3 IMPLANT
MARKER SKIN DUAL TIP RULER LAB (MISCELLANEOUS) ×3 IMPLANT
NEEDLE HYPO 25GX1X1/2 BEV (NEEDLE) ×3 IMPLANT
NS IRRIG 1000ML POUR BTL (IV SOLUTION) ×3 IMPLANT
PAD ARMBOARD 7.5X6 YLW CONV (MISCELLANEOUS) ×3 IMPLANT
PASSER CATH 38CM DISP (INSTRUMENTS) ×3 IMPLANT
PENCIL SMOKE EVACUATOR (MISCELLANEOUS) ×3 IMPLANT
POSITIONER HEAD DONUT 9IN (MISCELLANEOUS) ×3 IMPLANT
PROBE NERVE STIMULATOR (NEUROSURGERY SUPPLIES) ×3 IMPLANT
REMOTE CONTROL SLEEP INSPIRE (MISCELLANEOUS) ×3 IMPLANT
SET WALTER ACTIVATION W/DRAPE (SET/KITS/TRAYS/PACK) ×3 IMPLANT
SPONGE INTESTINAL PEANUT (DISPOSABLE) ×6 IMPLANT
STAPLER VISISTAT 35W (STAPLE) ×3 IMPLANT
SUT SILK 2 0 SH (SUTURE) ×6 IMPLANT
SUT SILK 3 0 RB1 (SUTURE) IMPLANT
SUT SILK 3 0 REEL (SUTURE) ×3 IMPLANT
SUT SILK 3 0 SH 30 (SUTURE) IMPLANT
SUT SILK 3-0 (SUTURE) ×6
SUT SILK 3-0 RB1 30XBRD (SUTURE) ×2
SUT VIC AB 4-0 PS2 27 (SUTURE) ×6 IMPLANT
SUT VIC AB 5-0 P-3 18XBRD (SUTURE) ×2 IMPLANT
SUT VIC AB 5-0 P3 18 (SUTURE) ×6
SUTURE SILK 3-0 RB1 30XBRD (SUTURE) ×2 IMPLANT
SYR 10ML LL (SYRINGE) ×3 IMPLANT
TAPE CLOTH 3X10 TAN LF (GAUZE/BANDAGES/DRESSINGS) ×3 IMPLANT
TAPE CLOTH SURG 4X10 WHT LF (GAUZE/BANDAGES/DRESSINGS) IMPLANT
TOWEL GREEN STERILE (TOWEL DISPOSABLE) ×3 IMPLANT
TRAY ENT MC OR (CUSTOM PROCEDURE TRAY) ×3 IMPLANT

## 2021-01-25 NOTE — Anesthesia Postprocedure Evaluation (Signed)
Anesthesia Post Note  Patient: [name]  Procedure(s) Performed: IMPLANTATION OF HYPOGLOSSAL NERVE STIMULATOR (Right: Chest)     Patient location during evaluation: PACU Anesthesia Type: General Level of consciousness: awake and alert Pain management: pain level controlled Vital Signs Assessment: post-procedure vital signs reviewed and stable Respiratory status: spontaneous breathing, nonlabored ventilation, respiratory function stable and patient connected to nasal cannula oxygen Cardiovascular status: blood pressure returned to baseline and stable Postop Assessment: no apparent nausea or vomiting Anesthetic complications: no   No notable events documented.  Last Vitals:  Vitals:   01/25/21 1250 01/25/21 1305  BP: 117/64 117/61  Pulse: 77 73  Resp: 16 15  Temp:  36.8 C  SpO2: 96% 95%    Last Pain:  Vitals:   01/25/21 1305  PainSc: 0-No pain                 Earl Lites P Alondria Mousseau

## 2021-01-25 NOTE — Op Note (Signed)
Operative Note: INSPIRE IMPLANT  Patient: [name]  Medical record number: [id number]  Date:01/25/2021  Pre-operative Indications: Moderate/severe obstructive sleep apnea with positive airway pressure intolerance  Postoperative Indications: Same  Surgical Procedure:  1.  12th cranial nerve (hypoglossal) stimulation implant    2.  Placement of chest wall respiratory sensor    3.  Electronic analysis of implanted neurostimulator with pulse generator system  Anesthesia: GET  Surgeon: Barbee Cough, M.D.  Assist: Scot Jun, PA  Mr. Spainhour's assistance was required throughout the surgical procedure including surgical planning, retraction, management of bleeding and surgical decision-making throughout the operation.   BMI: 29.5  Complications: None  EBL: Less than 50 cc   Brief History: The patient is a 65 y.o. male with a history of moderate/severe obstructive sleep apnea.  The patient has undergone work-up including sleep study and trial of CPAP which they did not tolerate.  Patient is unable to use long-term CPAP for control of obstructive sleep apnea.  Patient underwent DISE which showed anterior to posterior airway obstruction, considered a good candidate for Inspire Implant. Given the patient's history and findings, I recommended Inspire Implant under general anesthesia, risks and benefits were discussed in detail with the patient and family. They understand and agree with our plan for surgery which is scheduled at [facility] in OR on an elective basis.  Surgical Procedure: The patient is brought to the operating room on 01/25/2021 and placed in supine position on the operating table. General endotracheal anesthesia was established without difficulty. When the patient was adequately anesthetized, surgical timeout was performed and correct identification of the patient and the surgical procedure. The patient was injected with 6 cc of 1% lidocaine 1:100,000 dilution  epinephrine in subcutaneous fashion in the proposed skin incisions.  The patient was positioned and prepped and draped in sterile fashion.  The NIMS monitoring system was positioned and electrodes were placed in the anterior floor of mouth and tongue to assess the genioglossus and styloglossus muscle groups.  Nerve monitoring was used throughout the surgical procedure.  The procedure was begun by creating a modified right submandibular incision in the upper anterior lateral neck ~2 cm below the mandible and in a natural skin crease.  The incision was carried through the skin and underlying subcutaneous tissue to the level of the platysma.  Platysma muscle was divided and subplatysmal flaps were elevated superiorly and inferiorly.  The submandibular space was entered and the submandibular gland was identified and retracted posteriorly.  The digastric tendon was identified and dissection was carried out anterior and posterior along the tendon and muscle bellies.  The mylohyoid muscle was identified and retracted anteriorly and the hypoglossal nerve was carefully dissected across the anterior floor of the submandibular space.  Lateral branches to the retrusor muscles were identified and tested intraoperatively using the NIMS stimulator.  Stimulation electrode cuff for the hypoglossal nerve stimulator was placed distal to these branches on the medial hypoglossal nerve branch innervating the genioglossus muscle.  Diagnostic evaluation confirmed activation of the genioglossus muscle, resulting in genioglossal activation and tongue protrusion which was visually confirmed in the operating room.  The stimulation electrode was  sutured to the digastric tendon with interrupted 4-0 silk sutures.  A second second incision was created in the anterior upper right chest wall overlying the second rib interspace.  Incision was carried through the skin and underlying subcutaneous tissue to the level of the pectoralis major muscle.   The IPG pocket was created deep to  the subcutaneous layer and superficial to the pectoralis muscle.  Placement of the stimulation lead was then undertaken by gentle dissection through the pectoralis major muscle parallel to the muscle fibers overlying the second rib interspace.  The subpectoral fat plane was then divided bluntly and the medial border of the external intercostal muscle was identified.  1 cm posterior to the medial border, dissection was carried through the external intercostal and a plane was developed in the second rib interspace.  The sensor lead was then tunneled into the second rib interspace and sutured with 3-0 silk suture to secure it in proper orientation with the sensing probe facing the pleural space.  The pectoralis major muscle dissection was then brought back into its normal anatomic position and the sense lead was brought out into the subcutaneous pocket.  The lead for the stimulating electrode was then tunneled in a subplatysmal fashion from the submandibular incision to the anterior chest wall incision.  The stimulating electrode and respiration sensing lead were connected to the implantable pulse generator.  Diagnostic evaluation was run confirming respiratory signal and good tongue protrusion on stimulation.  The implantable pulse generator was then placed in the subclavicular pocket and sutured to the pectoralis fascia with 2-0 silk sutures sutures.  The incisions were thoroughly irrigated with bacitracin saline irrigation.  The incisions were then closed in multiple layers with 4-0 and 5-0 Vicryl interrupted sutures in the deep and superficial subcutaneous levels at each incision site.  Dermabond surgical glue was used for skin closure.  The patient's incisions were dressed with rolled gauze and Hypafix tape for pressure dressing.    An orogastric tube was passed and stomach contents were aspirated. Patient was awakened from anesthetic and transferred from the operating  room to the recovery room in stable condition. There were no complications and blood loss was minimal.  X-rays were obtained in the recovery room to assess the proper location of the pulse generator, sensor lead and stimulation lead.   Barbee Cough, M.D. [facility] 01/25/2021

## 2021-01-25 NOTE — H&P (Signed)
[name] is an 65 y.o. male.   Chief Complaint: OSA HPI: Pt with OSA, unable to tol CPAP  Past Medical History:  Diagnosis Date   Arthritis    generalized   Coronary artery disease    stents placed 2003   Diabetes mellitus without complication (HCC)    type 2   Hyperlipidemia    Hypertension    Sleep apnea    on CPAP    Past Surgical History:  Procedure Laterality Date   BARIATRIC SURGERY  12/2015   BICEPS TENDON REPAIR     CARDIAC CATHETERIZATION  2000   DRUG INDUCED ENDOSCOPY N/A 08/26/2020   Procedure: DRUG INDUCED ENDOSCOPY;  Surgeon: Osborn Coho, MD;  Location: [facility];  Service: ENT;  Laterality: N/A;   KNEE ARTHROSCOPY Bilateral    septorhinoplasty     SHOULDER ARTHROSCOPY Bilateral    TONSILLECTOMY     UVULLA REMOVAL      Family History  Problem Relation Age of Onset   Diabetes Mother    Diabetes Father    Heart attack Father    Social History:  reports that he has never smoked. He has never used smokeless tobacco. He reports that he does not currently use alcohol. He reports that he does not use drugs.  Allergies: Not on File  Medications Prior to Admission  Medication Sig Dispense Refill   aspirin 81 MG tablet Take 81 mg by mouth daily.     b complex vitamins tablet Take 1 tablet by mouth daily.     Calcium 500-100 MG-UNIT CHEW Chew 1 tablet by mouth 4 (four) times daily.     Cholecalciferol (VITAMIN D) 50 MCG (2000 UT) tablet Take 6,000 Units by mouth daily.     empagliflozin (JARDIANCE) 25 MG TABS tablet Take 12.5 mg by mouth daily.     ferrous sulfate 325 (65 FE) MG EC tablet Take 325 mg by mouth daily.     icosapent Ethyl (VASCEPA) 1 g capsule Take 1 g by mouth 3 (three) times daily.     Magnesium 250 MG TABS Take 250 mg by mouth daily.     metFORMIN (GLUCOPHAGE) 850 MG tablet Take 850 mg by mouth 2 (two) times daily with a meal.     metoprolol succinate (TOPROL-XL) 25 MG 24 hr tablet TAKE 1 TABLET BY MOUTH  DAILY 90  tablet 3   Multiple Vitamin (MULTIVITAMIN) tablet Take 1 tablet by mouth 2 (two) times daily.     Polyethyl Glycol-Propyl Glycol (SYSTANE OP) Place 1 drop into both eyes daily as needed (dry eyes).     potassium chloride SA (K-DUR,KLOR-CON) 20 MEQ tablet Take 20 mEq by mouth 2 (two) times daily.      ramipril (ALTACE) 2.5 MG capsule TAKE 1 CAPSULE BY MOUTH  DAILY 90 capsule 3   rosuvastatin (CRESTOR) 20 MG tablet TAKE 1 TABLET(20 MG) BY MOUTH DAILY 90 tablet 2   vitamin B-12 (CYANOCOBALAMIN) 1000 MCG tablet Take 1,000 mcg by mouth daily.      Results for orders placed or performed during the hospital encounter of 01/25/21 (from the past 48 hour(s))  Glucose, capillary     Status: Abnormal   Collection Time: 01/25/21  6:35 AM  Result Value Ref Range   Glucose-Capillary 146 (H) 70 - 99 mg/dL    Comment: Glucose reference range applies only to samples taken after fasting for at least 8 hours.   No results found.  Review of Systems  Constitutional: Negative.  HENT: Negative.    Respiratory:  Positive for apnea.   Cardiovascular: Negative.    Blood pressure (!) 165/84, pulse 75, temperature 98.7 F (37.1 C), resp. rate 18, height 5\' 11"  (1.803 m), weight 96.2 kg, SpO2 97 %. Physical Exam Constitutional:      Appearance: He is normal weight.  Cardiovascular:     Rate and Rhythm: Normal rate.  Pulmonary:     Effort: Pulmonary effort is normal.  Musculoskeletal:     Cervical back: Normal range of motion.  Neurological:     Mental Status: He is alert.     Assessment/Plan Adm for OP Hypoglossal nerve stimulator.  , MD 01/25/2021, 8:34 AM

## 2021-01-25 NOTE — Anesthesia Procedure Notes (Signed)
Procedure Name: Intubation Date/Time: 01/25/2021 8:52 AM Performed by: Barrington Ellison, CRNA Pre-anesthesia Checklist: Patient identified, Emergency Drugs available, Suction available and Patient being monitored Patient Re-evaluated:Patient Re-evaluated prior to induction Oxygen Delivery Method: Circle System Utilized Preoxygenation: Pre-oxygenation with 100% oxygen Induction Type: IV induction Ventilation: Mask ventilation without difficulty Laryngoscope Size: Mac and 4 Grade View: Grade I Tube type: Oral Tube size: 7.5 mm Number of attempts: 1 Airway Equipment and Method: Stylet and Oral airway Placement Confirmation: ETT inserted through vocal cords under direct vision, positive ETCO2 and breath sounds checked- equal and bilateral Secured at: 23 cm Tube secured with: Tape Dental Injury: Teeth and Oropharynx as per pre-operative assessment

## 2021-01-25 NOTE — Transfer of Care (Signed)
Immediate Anesthesia Transfer of Care Note  Patient: [name]  Procedure(s) Performed: IMPLANTATION OF HYPOGLOSSAL NERVE STIMULATOR (Right: Chest)  Patient Location: PACU  Anesthesia Type:General  Level of Consciousness: lethargic and responds to stimulation  Airway & Oxygen Therapy: Patient Spontanous Breathing and Patient connected to nasal cannula oxygen  Post-op Assessment: Report given to RN  Post vital signs: Reviewed and stable  Last Vitals:  Vitals Value Taken Time  BP 105/50   Temp    Pulse 70 01/25/21 1150  Resp 18 01/25/21 1150  SpO2 98 % 01/25/21 1150  Vitals shown include unvalidated device data.  Last Pain:  Vitals:   01/25/21 0659  PainSc: 0-No pain         Complications: No notable events documented.

## 2021-01-26 ENCOUNTER — Encounter (HOSPITAL_COMMUNITY): Payer: Self-pay | Admitting: Otolaryngology

## 2021-01-31 DIAGNOSIS — E1142 Type 2 diabetes mellitus with diabetic polyneuropathy: Secondary | ICD-10-CM | POA: Diagnosis not present

## 2021-01-31 DIAGNOSIS — L859 Epidermal thickening, unspecified: Secondary | ICD-10-CM | POA: Diagnosis not present

## 2021-01-31 DIAGNOSIS — M79672 Pain in left foot: Secondary | ICD-10-CM | POA: Diagnosis not present

## 2021-02-06 DIAGNOSIS — E118 Type 2 diabetes mellitus with unspecified complications: Secondary | ICD-10-CM | POA: Diagnosis not present

## 2021-02-06 DIAGNOSIS — K760 Fatty (change of) liver, not elsewhere classified: Secondary | ICD-10-CM | POA: Diagnosis not present

## 2021-02-09 DIAGNOSIS — S134XXA Sprain of ligaments of cervical spine, initial encounter: Secondary | ICD-10-CM | POA: Diagnosis not present

## 2021-02-09 DIAGNOSIS — M9903 Segmental and somatic dysfunction of lumbar region: Secondary | ICD-10-CM | POA: Diagnosis not present

## 2021-02-09 DIAGNOSIS — S233XXA Sprain of ligaments of thoracic spine, initial encounter: Secondary | ICD-10-CM | POA: Diagnosis not present

## 2021-02-09 DIAGNOSIS — M9902 Segmental and somatic dysfunction of thoracic region: Secondary | ICD-10-CM | POA: Diagnosis not present

## 2021-02-09 DIAGNOSIS — M9901 Segmental and somatic dysfunction of cervical region: Secondary | ICD-10-CM | POA: Diagnosis not present

## 2021-02-09 DIAGNOSIS — M47816 Spondylosis without myelopathy or radiculopathy, lumbar region: Secondary | ICD-10-CM | POA: Diagnosis not present

## 2021-02-23 DIAGNOSIS — M9901 Segmental and somatic dysfunction of cervical region: Secondary | ICD-10-CM | POA: Diagnosis not present

## 2021-02-23 DIAGNOSIS — M9903 Segmental and somatic dysfunction of lumbar region: Secondary | ICD-10-CM | POA: Diagnosis not present

## 2021-02-23 DIAGNOSIS — M47816 Spondylosis without myelopathy or radiculopathy, lumbar region: Secondary | ICD-10-CM | POA: Diagnosis not present

## 2021-02-23 DIAGNOSIS — S134XXA Sprain of ligaments of cervical spine, initial encounter: Secondary | ICD-10-CM | POA: Diagnosis not present

## 2021-02-23 DIAGNOSIS — M9902 Segmental and somatic dysfunction of thoracic region: Secondary | ICD-10-CM | POA: Diagnosis not present

## 2021-02-23 DIAGNOSIS — S233XXA Sprain of ligaments of thoracic spine, initial encounter: Secondary | ICD-10-CM | POA: Diagnosis not present

## 2021-03-08 DIAGNOSIS — Z4542 Encounter for adjustment and management of neuropacemaker (brain) (peripheral nerve) (spinal cord): Secondary | ICD-10-CM | POA: Diagnosis not present

## 2021-03-08 DIAGNOSIS — G4733 Obstructive sleep apnea (adult) (pediatric): Secondary | ICD-10-CM | POA: Diagnosis not present

## 2021-03-09 DIAGNOSIS — M47816 Spondylosis without myelopathy or radiculopathy, lumbar region: Secondary | ICD-10-CM | POA: Diagnosis not present

## 2021-03-09 DIAGNOSIS — S134XXA Sprain of ligaments of cervical spine, initial encounter: Secondary | ICD-10-CM | POA: Diagnosis not present

## 2021-03-09 DIAGNOSIS — M9901 Segmental and somatic dysfunction of cervical region: Secondary | ICD-10-CM | POA: Diagnosis not present

## 2021-03-09 DIAGNOSIS — M9903 Segmental and somatic dysfunction of lumbar region: Secondary | ICD-10-CM | POA: Diagnosis not present

## 2021-03-09 DIAGNOSIS — S233XXA Sprain of ligaments of thoracic spine, initial encounter: Secondary | ICD-10-CM | POA: Diagnosis not present

## 2021-03-09 DIAGNOSIS — M9902 Segmental and somatic dysfunction of thoracic region: Secondary | ICD-10-CM | POA: Diagnosis not present

## 2021-03-14 DIAGNOSIS — H40013 Open angle with borderline findings, low risk, bilateral: Secondary | ICD-10-CM | POA: Diagnosis not present

## 2021-03-23 DIAGNOSIS — M9902 Segmental and somatic dysfunction of thoracic region: Secondary | ICD-10-CM | POA: Diagnosis not present

## 2021-03-23 DIAGNOSIS — M9901 Segmental and somatic dysfunction of cervical region: Secondary | ICD-10-CM | POA: Diagnosis not present

## 2021-03-23 DIAGNOSIS — M47816 Spondylosis without myelopathy or radiculopathy, lumbar region: Secondary | ICD-10-CM | POA: Diagnosis not present

## 2021-03-23 DIAGNOSIS — M9903 Segmental and somatic dysfunction of lumbar region: Secondary | ICD-10-CM | POA: Diagnosis not present

## 2021-03-23 DIAGNOSIS — S134XXA Sprain of ligaments of cervical spine, initial encounter: Secondary | ICD-10-CM | POA: Diagnosis not present

## 2021-03-23 DIAGNOSIS — S233XXA Sprain of ligaments of thoracic spine, initial encounter: Secondary | ICD-10-CM | POA: Diagnosis not present

## 2021-03-28 DIAGNOSIS — E782 Mixed hyperlipidemia: Secondary | ICD-10-CM | POA: Diagnosis not present

## 2021-03-28 DIAGNOSIS — K760 Fatty (change of) liver, not elsewhere classified: Secondary | ICD-10-CM | POA: Diagnosis not present

## 2021-03-28 DIAGNOSIS — E1143 Type 2 diabetes mellitus with diabetic autonomic (poly)neuropathy: Secondary | ICD-10-CM | POA: Diagnosis not present

## 2021-03-28 DIAGNOSIS — E133553 Other specified diabetes mellitus with stable proliferative diabetic retinopathy, bilateral: Secondary | ICD-10-CM | POA: Diagnosis not present

## 2021-03-28 DIAGNOSIS — Z125 Encounter for screening for malignant neoplasm of prostate: Secondary | ICD-10-CM | POA: Diagnosis not present

## 2021-03-28 DIAGNOSIS — I10 Essential (primary) hypertension: Secondary | ICD-10-CM | POA: Diagnosis not present

## 2021-03-28 DIAGNOSIS — E7849 Other hyperlipidemia: Secondary | ICD-10-CM | POA: Diagnosis not present

## 2021-03-28 DIAGNOSIS — Z1159 Encounter for screening for other viral diseases: Secondary | ICD-10-CM | POA: Diagnosis not present

## 2021-03-28 DIAGNOSIS — E113313 Type 2 diabetes mellitus with moderate nonproliferative diabetic retinopathy with macular edema, bilateral: Secondary | ICD-10-CM | POA: Diagnosis not present

## 2021-03-31 DIAGNOSIS — E7849 Other hyperlipidemia: Secondary | ICD-10-CM | POA: Diagnosis not present

## 2021-03-31 DIAGNOSIS — Z1331 Encounter for screening for depression: Secondary | ICD-10-CM | POA: Diagnosis not present

## 2021-03-31 DIAGNOSIS — Z1389 Encounter for screening for other disorder: Secondary | ICD-10-CM | POA: Diagnosis not present

## 2021-03-31 DIAGNOSIS — R972 Elevated prostate specific antigen [PSA]: Secondary | ICD-10-CM | POA: Diagnosis not present

## 2021-03-31 DIAGNOSIS — E113313 Type 2 diabetes mellitus with moderate nonproliferative diabetic retinopathy with macular edema, bilateral: Secondary | ICD-10-CM | POA: Diagnosis not present

## 2021-03-31 DIAGNOSIS — E1143 Type 2 diabetes mellitus with diabetic autonomic (poly)neuropathy: Secondary | ICD-10-CM | POA: Diagnosis not present

## 2021-03-31 DIAGNOSIS — I10 Essential (primary) hypertension: Secondary | ICD-10-CM | POA: Diagnosis not present

## 2021-03-31 DIAGNOSIS — K760 Fatty (change of) liver, not elsewhere classified: Secondary | ICD-10-CM | POA: Diagnosis not present

## 2021-04-05 DIAGNOSIS — Z4542 Encounter for adjustment and management of neuropacemaker (brain) (peripheral nerve) (spinal cord): Secondary | ICD-10-CM | POA: Diagnosis not present

## 2021-04-05 DIAGNOSIS — G4733 Obstructive sleep apnea (adult) (pediatric): Secondary | ICD-10-CM | POA: Diagnosis not present

## 2021-04-07 DIAGNOSIS — M9903 Segmental and somatic dysfunction of lumbar region: Secondary | ICD-10-CM | POA: Diagnosis not present

## 2021-04-07 DIAGNOSIS — S134XXA Sprain of ligaments of cervical spine, initial encounter: Secondary | ICD-10-CM | POA: Diagnosis not present

## 2021-04-07 DIAGNOSIS — M9901 Segmental and somatic dysfunction of cervical region: Secondary | ICD-10-CM | POA: Diagnosis not present

## 2021-04-07 DIAGNOSIS — S233XXA Sprain of ligaments of thoracic spine, initial encounter: Secondary | ICD-10-CM | POA: Diagnosis not present

## 2021-04-07 DIAGNOSIS — M47816 Spondylosis without myelopathy or radiculopathy, lumbar region: Secondary | ICD-10-CM | POA: Diagnosis not present

## 2021-04-07 DIAGNOSIS — M9902 Segmental and somatic dysfunction of thoracic region: Secondary | ICD-10-CM | POA: Diagnosis not present

## 2021-04-14 DIAGNOSIS — E113493 Type 2 diabetes mellitus with severe nonproliferative diabetic retinopathy without macular edema, bilateral: Secondary | ICD-10-CM | POA: Diagnosis not present

## 2021-04-14 DIAGNOSIS — Z7984 Long term (current) use of oral hypoglycemic drugs: Secondary | ICD-10-CM | POA: Diagnosis not present

## 2021-04-14 DIAGNOSIS — E1122 Type 2 diabetes mellitus with diabetic chronic kidney disease: Secondary | ICD-10-CM | POA: Diagnosis not present

## 2021-04-14 DIAGNOSIS — E1142 Type 2 diabetes mellitus with diabetic polyneuropathy: Secondary | ICD-10-CM | POA: Diagnosis not present

## 2021-04-14 DIAGNOSIS — R809 Proteinuria, unspecified: Secondary | ICD-10-CM | POA: Diagnosis not present

## 2021-04-14 DIAGNOSIS — N182 Chronic kidney disease, stage 2 (mild): Secondary | ICD-10-CM | POA: Diagnosis not present

## 2021-04-19 ENCOUNTER — Encounter: Payer: Self-pay | Admitting: Urology

## 2021-04-19 ENCOUNTER — Other Ambulatory Visit: Payer: Self-pay

## 2021-04-19 ENCOUNTER — Ambulatory Visit (INDEPENDENT_AMBULATORY_CARE_PROVIDER_SITE_OTHER): Payer: PPO | Admitting: Urology

## 2021-04-19 VITALS — BP 110/73 | HR 72 | Ht 71.0 in | Wt 217.0 lb

## 2021-04-19 DIAGNOSIS — N401 Enlarged prostate with lower urinary tract symptoms: Secondary | ICD-10-CM | POA: Diagnosis not present

## 2021-04-19 DIAGNOSIS — N138 Other obstructive and reflux uropathy: Secondary | ICD-10-CM

## 2021-04-19 DIAGNOSIS — R339 Retention of urine, unspecified: Secondary | ICD-10-CM

## 2021-04-19 DIAGNOSIS — R972 Elevated prostate specific antigen [PSA]: Secondary | ICD-10-CM | POA: Diagnosis not present

## 2021-04-19 LAB — URINALYSIS, ROUTINE W REFLEX MICROSCOPIC
Bilirubin, UA: NEGATIVE
Leukocytes,UA: NEGATIVE
Nitrite, UA: NEGATIVE
Protein,UA: NEGATIVE
RBC, UA: NEGATIVE
Specific Gravity, UA: 1.02 (ref 1.005–1.030)
Urobilinogen, Ur: 0.2 mg/dL (ref 0.2–1.0)
pH, UA: 6 (ref 5.0–7.5)

## 2021-04-19 LAB — BLADDER SCAN AMB NON-IMAGING: Scan Result: 251

## 2021-04-19 MED ORDER — TAMSULOSIN HCL 0.4 MG PO CAPS: 0.4000 mg | ORAL_CAPSULE | Freq: Every day | ORAL | 11 refills | Status: DC

## 2021-04-19 NOTE — Progress Notes (Signed)
Assessment: 1. Elevated PSA   2. BPH with obstruction/lower urinary tract symptoms   3. Incomplete bladder emptying      Plan: I reviewed the records from [facility] including lab results. Today I had a long discussion with the patient regarding PSA and the rationale and controversies of prostate cancer early detection.  I discussed the pros and cons of further evaluation including TRUS and prostate Bx.  Potential adverse events and complications as well as standard instructions were given.  Patient expressed his understanding of these issues. Trial of tamsulosin 0.4 mg daily. Rx sent.  Use and side effects discussed. Repeat free and total PSA today. Schedule for prostate ultrasound and biopsy.   Chief Complaint:  Chief Complaint  Patient presents with   Elevated PSA    History of Present Illness:  [name] is a 66 y.o. year old male who is seen in consultation from [place], Virgina Evener, MD for evaluation of elevated PSA.   PSA results: 7/20 2.5 1/23 5.0 No prior history of elevated PSA.  No history of UTIs or prostatitis.  No family history of prostate cancer.  He does have lower urinary tract symptoms including frequency, nocturia 1-2 times, and decreased force of stream.  No dysuria or gross hematuria. IPSS = 7 today.  Past Medical History:  Past Medical History:  Diagnosis Date   Arthritis    generalized   Coronary artery disease    stents placed 2003   Diabetes mellitus without complication ([place])    type 2   Hyperlipidemia    Hypertension    Sleep apnea    on CPAP    Past Surgical History:  Past Surgical History:  Procedure Laterality Date   BARIATRIC SURGERY  12/2015   BICEPS TENDON REPAIR     CARDIAC CATHETERIZATION  2000   DRUG INDUCED ENDOSCOPY N/A 08/26/2020   Procedure: DRUG INDUCED ENDOSCOPY;  Surgeon: Jerrell Belfast, MD;  Location: [facility];  Service: ENT;  Laterality: N/A;   IMPLANTATION OF HYPOGLOSSAL NERVE  STIMULATOR Right 01/25/2021   Procedure: IMPLANTATION OF HYPOGLOSSAL NERVE STIMULATOR;  Surgeon: Jerrell Belfast, MD;  Location: [facility];  Service: ENT;  Laterality: Right;   KNEE ARTHROSCOPY Bilateral    septorhinoplasty     SHOULDER ARTHROSCOPY Bilateral    TONSILLECTOMY     UVULLA REMOVAL      Allergies:  No Known Allergies  Family History:  Family History  Problem Relation Age of Onset   Diabetes Mother    Diabetes Father    Heart attack Father     Social History:  Social History   Tobacco Use   Smoking status: Never   Smokeless tobacco: Never  Vaping Use   Vaping Use: Never used  Substance Use Topics   Alcohol use: Not Currently    Alcohol/week: 0.0 standard drinks   Drug use: Never    Review of symptoms:  Constitutional:  Negative for unexplained weight loss, night sweats, fever, chills ENT:  Negative for nose bleeds, sinus pain, painful swallowing CV:  Negative for chest pain, shortness of breath, exercise intolerance, palpitations, loss of consciousness Resp:  Negative for cough, wheezing, shortness of breath GI:  Negative for nausea, vomiting, diarrhea, bloody stools GU:  Positives noted in HPI; otherwise negative for gross hematuria, dysuria, urinary incontinence Neuro:  Negative for seizures, poor balance, limb weakness, slurred speech Psych:  Negative for lack of energy, depression, anxiety Endocrine:  Negative for polydipsia, polyuria, symptoms of hypoglycemia (dizziness,  hunger, sweating) Hematologic:  Negative for anemia, purpura, petechia, prolonged or excessive bleeding, use of anticoagulants  Allergic:  Negative for difficulty breathing or choking as a result of exposure to anything; no shellfish allergy; no allergic response (rash/itch) to materials, foods  Physical exam: BP 110/73 (BP Location: Right Arm)    Pulse 72    Ht 5\' 11"  (1.803 m)    Wt 217 lb (98.4 kg)    BMI 30.27 kg/m  GENERAL APPEARANCE:  Well appearing, well developed, well nourished,  NAD HEENT: Atraumatic, Normocephalic, oropharynx clear. NECK: Supple without lymphadenopathy or thyromegaly. LUNGS: Clear to auscultation bilaterally. HEART: Regular Rate and Rhythm without murmurs, gallops, or rubs. ABDOMEN: Soft, non-tender, No Masses. EXTREMITIES: Moves all extremities well.  Without clubbing, cyanosis, or edema. NEUROLOGIC:  Alert and oriented x 3, normal gait, CN II-XII grossly intact.  MENTAL STATUS:  Appropriate. BACK:  Non-tender to palpation.  No CVAT SKIN:  Warm, dry and intact.   GU: Penis:  circumcised Meatus: Normal Scrotum: normal, no masses Testis: normal without masses bilateral Epididymis: normal Prostate: 40 g, NT, no nodules Rectum: Normal tone,  no masses or tenderness   Results: U/A:  dipstick negative  PVR:  251 ml

## 2021-04-19 NOTE — Progress Notes (Signed)
post void residual= 251

## 2021-04-19 NOTE — Addendum Note (Signed)
Addended by: Bonnita Levan on: 04/19/2021 10:08 AM   Modules accepted: Orders

## 2021-04-19 NOTE — Patient Instructions (Signed)
° °  Appointment Time: °Appointment Date: ° °Location: [facility]  ° °Prostate Biopsy Instructions ° °Stop all aspirin or blood thinners (aspirin, plavix, coumadin, warfarin, motrin, ibuprofen, advil, aleve, naproxen, naprosyn) for 7 days prior to the procedure.  If you have any questions about stopping these medications, please contact your primary care physician or cardiologist. ° °Having a light meal prior to the procedure is recommended.  If you are diabetic or have low blood sugar please bring a small snack or glucose tablet. ° °A Fleets enema is needed to be purchased over the counter at a local pharmacy and used 2 hours before you scheduled appointment.  This can be purchased over the counter at any pharmacy. ° °Antibiotics will be administered in the clinic at the time of the procedure.  ° °Please bring someone with you to the procedure to drive you home if you are given a valium to take prior to your procedure. ° ° °If you have any questions or concerns, please feel free to call the office at [phone] or send a Mychart message. ° ° ° °Thank you, °[facility]  °

## 2021-04-20 ENCOUNTER — Telehealth: Payer: Self-pay

## 2021-04-20 DIAGNOSIS — R972 Elevated prostate specific antigen [PSA]: Secondary | ICD-10-CM

## 2021-04-20 DIAGNOSIS — M9902 Segmental and somatic dysfunction of thoracic region: Secondary | ICD-10-CM | POA: Diagnosis not present

## 2021-04-20 DIAGNOSIS — M47816 Spondylosis without myelopathy or radiculopathy, lumbar region: Secondary | ICD-10-CM | POA: Diagnosis not present

## 2021-04-20 DIAGNOSIS — S134XXA Sprain of ligaments of cervical spine, initial encounter: Secondary | ICD-10-CM | POA: Diagnosis not present

## 2021-04-20 DIAGNOSIS — M9903 Segmental and somatic dysfunction of lumbar region: Secondary | ICD-10-CM | POA: Diagnosis not present

## 2021-04-20 DIAGNOSIS — S233XXA Sprain of ligaments of thoracic spine, initial encounter: Secondary | ICD-10-CM | POA: Diagnosis not present

## 2021-04-20 DIAGNOSIS — M9901 Segmental and somatic dysfunction of cervical region: Secondary | ICD-10-CM | POA: Diagnosis not present

## 2021-04-20 LAB — PSA, TOTAL AND FREE
PSA, Free Pct: 15.7 %
PSA, Free: 0.44 ng/mL
Prostate Specific Ag, Serum: 2.8 ng/mL (ref 0.0–4.0)

## 2021-04-20 NOTE — Telephone Encounter (Signed)
Patient called and notified.  Biopsy cancelled- PSA order placed.  Appts created and mailed to patient.

## 2021-04-20 NOTE — Telephone Encounter (Signed)
-----   Message from Milderd Meager, MD sent at 04/20/2021  9:03 AM EST ----- Please notify the patient that his PSA was back down to a normal range.  Consequently, we can hold off on a prostate biopsy.  Please cancel his appointment for the biopsy.  I would recommend that he follow-up with me in approximately 3 months to continue to monitor his PSA.

## 2021-05-01 ENCOUNTER — Other Ambulatory Visit: Payer: PPO | Admitting: Urology

## 2021-05-01 ENCOUNTER — Ambulatory Visit (HOSPITAL_COMMUNITY): Payer: PPO

## 2021-05-02 DIAGNOSIS — E1142 Type 2 diabetes mellitus with diabetic polyneuropathy: Secondary | ICD-10-CM | POA: Diagnosis not present

## 2021-05-02 DIAGNOSIS — M79672 Pain in left foot: Secondary | ICD-10-CM | POA: Diagnosis not present

## 2021-05-02 DIAGNOSIS — L859 Epidermal thickening, unspecified: Secondary | ICD-10-CM | POA: Diagnosis not present

## 2021-05-04 DIAGNOSIS — M9902 Segmental and somatic dysfunction of thoracic region: Secondary | ICD-10-CM | POA: Diagnosis not present

## 2021-05-04 DIAGNOSIS — M47816 Spondylosis without myelopathy or radiculopathy, lumbar region: Secondary | ICD-10-CM | POA: Diagnosis not present

## 2021-05-04 DIAGNOSIS — M9903 Segmental and somatic dysfunction of lumbar region: Secondary | ICD-10-CM | POA: Diagnosis not present

## 2021-05-04 DIAGNOSIS — M9901 Segmental and somatic dysfunction of cervical region: Secondary | ICD-10-CM | POA: Diagnosis not present

## 2021-05-04 DIAGNOSIS — S134XXA Sprain of ligaments of cervical spine, initial encounter: Secondary | ICD-10-CM | POA: Diagnosis not present

## 2021-05-04 DIAGNOSIS — S233XXA Sprain of ligaments of thoracic spine, initial encounter: Secondary | ICD-10-CM | POA: Diagnosis not present

## 2021-05-09 ENCOUNTER — Encounter (INDEPENDENT_AMBULATORY_CARE_PROVIDER_SITE_OTHER): Payer: PPO | Admitting: Ophthalmology

## 2021-05-16 ENCOUNTER — Encounter (INDEPENDENT_AMBULATORY_CARE_PROVIDER_SITE_OTHER): Payer: Self-pay | Admitting: Ophthalmology

## 2021-05-16 ENCOUNTER — Ambulatory Visit (INDEPENDENT_AMBULATORY_CARE_PROVIDER_SITE_OTHER): Payer: PPO | Admitting: Ophthalmology

## 2021-05-16 ENCOUNTER — Other Ambulatory Visit: Payer: Self-pay

## 2021-05-16 ENCOUNTER — Ambulatory Visit: Payer: PPO | Admitting: Urology

## 2021-05-16 DIAGNOSIS — G4733 Obstructive sleep apnea (adult) (pediatric): Secondary | ICD-10-CM | POA: Diagnosis not present

## 2021-05-16 DIAGNOSIS — E113411 Type 2 diabetes mellitus with severe nonproliferative diabetic retinopathy with macular edema, right eye: Secondary | ICD-10-CM | POA: Diagnosis not present

## 2021-05-16 DIAGNOSIS — E113492 Type 2 diabetes mellitus with severe nonproliferative diabetic retinopathy without macular edema, left eye: Secondary | ICD-10-CM | POA: Diagnosis not present

## 2021-05-16 NOTE — Assessment & Plan Note (Signed)
Patient had the inspire implant done with excellent result ?

## 2021-05-16 NOTE — Assessment & Plan Note (Signed)
Improved CSME, no active disease at this time ?

## 2021-05-16 NOTE — Progress Notes (Addendum)
05/16/2021     CHIEF COMPLAINT Patient presents for  Chief Complaint  Patient presents with   Diabetic Retinopathy with Macular Edema      HISTORY OF PRESENT ILLNESS: [name] is a 66 y.o. male who presents to the clinic today for:   HPI   4 mos fu ou oct fp. Patient states vision is stable and unchanged since last visit. Denies any new floaters or FOL. A1C: 5.7 two months ago, per patient, This morning, before eating LBS was 150. Inspire Implant for Sleep Apnea in November. Last edited by Nelva Nay on 05/16/2021  8:28 AM.      Referring physician: Juliette Alcide, MD [address] [place],  [place] [address]  HISTORICAL INFORMATION:   Selected notes from the MEDICAL RECORD NUMBER    Lab Results  Component Value Date   HGBA1C (H) 02/19/2007    8.5 (NOTE)   The ADA recommends the following therapeutic goals for glycemic   control related to Hgb A1C measurement:   Goal of Therapy:   < 7.0% Hgb A1C   Action Suggested:  > 8.0% Hgb A1C   Ref:  Diabetes Care, 22, Suppl. 1, 1999     CURRENT MEDICATIONS: Current Outpatient Medications (Ophthalmic Drugs)  Medication Sig   Polyethyl Glycol-Propyl Glycol (SYSTANE OP) Place 1 drop into both eyes daily as needed (dry eyes).   No current facility-administered medications for this visit. (Ophthalmic Drugs)   Current Outpatient Medications (Other)  Medication Sig   aspirin 81 MG tablet Take 81 mg by mouth daily.   b complex vitamins tablet Take 1 tablet by mouth daily.   Calcium 500-100 MG-UNIT CHEW Chew 1 tablet by mouth 4 (four) times daily.   Cholecalciferol (VITAMIN D) 50 MCG (2000 UT) tablet Take 6,000 Units by mouth daily.   empagliflozin (JARDIANCE) 25 MG TABS tablet Take 12.5 mg by mouth daily.   ferrous sulfate 325 (65 FE) MG EC tablet Take 325 mg by mouth daily.   HYDROcodone-acetaminophen (NORCO) 5-325 MG tablet 1 to 2 tablets as needed for severe pain.  Alternate Tylenol and Motrin for mild to moderate pain.    icosapent Ethyl (VASCEPA) 1 g capsule Take 1 g by mouth 3 (three) times daily.   Magnesium 250 MG TABS Take 250 mg by mouth daily.   metFORMIN (GLUCOPHAGE) 850 MG tablet Take 850 mg by mouth 2 (two) times daily with a meal.   metoprolol succinate (TOPROL-XL) 25 MG 24 hr tablet TAKE 1 TABLET BY MOUTH  DAILY   Multiple Vitamin (MULTIVITAMIN) tablet Take 1 tablet by mouth 2 (two) times daily.   potassium chloride SA (K-DUR,KLOR-CON) 20 MEQ tablet Take 20 mEq by mouth 2 (two) times daily.    ramipril (ALTACE) 2.5 MG capsule TAKE 1 CAPSULE BY MOUTH  DAILY   rosuvastatin (CRESTOR) 20 MG tablet TAKE 1 TABLET(20 MG) BY MOUTH DAILY   tamsulosin (FLOMAX) 0.4 MG CAPS capsule Take 1 capsule (0.4 mg total) by mouth daily.   vitamin B-12 (CYANOCOBALAMIN) 1000 MCG tablet Take 1,000 mcg by mouth daily.   No current facility-administered medications for this visit. (Other)      REVIEW OF SYSTEMS: ROS   Negative for: Constitutional, Gastrointestinal, Neurological, Skin, Genitourinary, Musculoskeletal, HENT, Endocrine, Cardiovascular, Eyes, Respiratory, Psychiatric, Allergic/Imm, Heme/Lymph Last edited by Edmon Crape, MD on 05/16/2021  9:17 AM.       ALLERGIES No Known Allergies  PAST MEDICAL HISTORY Past Medical History:  Diagnosis Date  Arthritis    generalized   Coronary artery disease    stents placed 2003   Hyperlipidemia    Hypertension    Sleep apnea    on CPAP   Past Surgical History:  Procedure Laterality Date   BARIATRIC SURGERY  12/2015   BICEPS TENDON REPAIR     CARDIAC CATHETERIZATION  2000   DRUG INDUCED ENDOSCOPY N/A 08/26/2020   Procedure: DRUG INDUCED ENDOSCOPY;  Surgeon: Osborn Coho, MD;  Location: [facility];  Service: ENT;  Laterality: N/A;   IMPLANTATION OF HYPOGLOSSAL NERVE STIMULATOR Right 01/25/2021   Procedure: IMPLANTATION OF HYPOGLOSSAL NERVE STIMULATOR;  Surgeon: Osborn Coho, MD;  Location: [facility] OR;  Service: ENT;  Laterality: Right;    KNEE ARTHROSCOPY Bilateral    septorhinoplasty     SHOULDER ARTHROSCOPY Bilateral    TONSILLECTOMY     UVULLA REMOVAL      FAMILY HISTORY Family History  Problem Relation Age of Onset   Diabetes Mother    Diabetes Father    Heart attack Father     SOCIAL HISTORY Social History   Tobacco Use   Smoking status: Never   Smokeless tobacco: Never  Vaping Use   Vaping Use: Never used  Substance Use Topics   Alcohol use: Not Currently    Alcohol/week: 0.0 standard drinks   Drug use: Never         OPHTHALMIC EXAM:  Base Eye Exam     Visual Acuity (ETDRS)       Right Left   Dist [place] 20/20 -2 20/60 -1   Dist ph [place]  20/25 -1         Tonometry (Tonopen, 8:30 AM)       Right Left   Pressure 11 9         Pupils       Pupils Dark Light APD   Right PERRL 4 3 None   Left PERRL 4 3 None         Visual Fields (Counting fingers)       Left Right    Full Full         Extraocular Movement       Right Left    Full Full         Neuro/Psych     Oriented x3: Yes   Mood/Affect: Normal         Dilation     Both eyes: 1.0% Mydriacyl, 2.5% Phenylephrine @ 8:30 AM           Slit Lamp and Fundus Exam     External Exam       Right Left   External Normal Normal         Slit Lamp Exam       Right Left   Lids/Lashes Normal Normal   Conjunctiva/Sclera White and quiet White and quiet   Cornea Clear Clear   Anterior Chamber Deep and quiet Deep and quiet   Iris Round and reactive Round and reactive   Lens Posterior chamber intraocular lens Posterior chamber intraocular lens   Anterior Vitreous Normal Normal         Fundus Exam       Right Left   Posterior Vitreous Normal Posterior vitreous detachment   Disc Normal Normal   C/D Ratio 0.65 0.65   Macula Microaneurysms, no clinically significant macular edema, Focal laser scars Microaneurysms, no macular thickening, no clinically significant macular edema, Focal laser scars   Vessels  Severe  NPDR Severe NPDR   Periphery Normal, no N/V Normal good peripheral PRP anteriorly temporally, no N/V            IMAGING AND PROCEDURES  Imaging and Procedures for 05/16/21  OCT, Retina - OU - Both Eyes       Right Eye Quality was good. Scan locations included subfoveal. Central Foveal Thickness: 278. Progression has been stable. Findings include abnormal foveal contour.   Left Eye Quality was good. Scan locations included subfoveal. Central Foveal Thickness: 276. Progression has been stable. Findings include abnormal foveal contour.   Notes OD, focal area of CSME temporally, resolved as compared to 5 months previous.  We will continue to monitor and observe  OS no sign of CSME     Color Fundus Photography Optos - OU - Both Eyes       Right Eye Progression has been stable. Disc findings include increased cup to disc ratio. Macula : normal observations. Vessels : IRMA.   Left Eye Progression has been stable. Disc findings include increased cup to disc ratio. Macula : normal observations. Vessels : IRMA.   Notes Good PRP temporally OS             ASSESSMENT/PLAN:  Obstructive sleep apnea Patient had the inspire implant done with excellent result  Severe nonproliferative diabetic retinopathy of right eye, with macular edema, associated with type 2 diabetes mellitus (HCC) Improved CSME, no active disease at this time  Severe nonproliferative diabetic retinopathy of left eye without macular edema associated with type 2 diabetes mellitus (HCC) Improved CSME, and temporal area of nonperfusion well treated with PRP no signs of CNVM     ICD-10-CM   1. Severe nonproliferative diabetic retinopathy of right eye, with macular edema, associated with type 2 diabetes mellitus (HCC)  E11.3411 OCT, Retina - OU - Both Eyes    Color Fundus Photography Optos - OU - Both Eyes    2. Obstructive sleep apnea  G47.33     3. Severe nonproliferative diabetic retinopathy of  left eye without macular edema associated with type 2 diabetes mellitus (HCC)  T55.7322       1.  OU with severe NPDR, no signs of progression to PDR.  2.  Minor CSME noted and evaluated last visit, some 4 to 5 months previous, has now resolved.  3.  No signs of progression  4.  Sleep apnea to be treated with the "inspire" implant, still going through calibration face  Ophthalmic Meds Ordered this visit:  No orders of the defined types were placed in this encounter.      Return in about 6 months (around 11/16/2021) for DILATE OU, COLOR FP, OCT.  There are no Patient Instructions on file for this visit.   Explained the diagnoses, plan, and follow up with the patient and they expressed understanding.  Patient expressed understanding of the importance of proper follow up care.   Alford Highland Xachary Hambly M.D. Diseases & Surgery of the Retina and Vitreous [facility] 05/16/21     Abbreviations: M myopia (nearsighted); A astigmatism; H hyperopia (farsighted); P presbyopia; Mrx spectacle prescription;  CTL contact lenses; OD right eye; OS left eye; OU both eyes  XT exotropia; ET esotropia; PEK punctate epithelial keratitis; PEE punctate epithelial erosions; DES dry eye syndrome; MGD meibomian gland dysfunction; ATs artificial tears; PFAT's preservative free artificial tears; NSC nuclear sclerotic cataract; PSC posterior subcapsular cataract; ERM epi-retinal membrane; PVD posterior vitreous detachment; RD retinal detachment; DM diabetes mellitus; DR  diabetic retinopathy; NPDR non-proliferative diabetic retinopathy; PDR proliferative diabetic retinopathy; CSME clinically significant macular edema; DME diabetic macular edema; dbh dot blot hemorrhages; CWS cotton wool spot; POAG primary open angle glaucoma; C/D cup-to-disc ratio; HVF humphrey visual field; GVF goldmann visual field; OCT optical coherence tomography; IOP intraocular pressure; BRVO Branch retinal vein occlusion; CRVO  central retinal vein occlusion; CRAO central retinal artery occlusion; BRAO branch retinal artery occlusion; RT retinal tear; SB scleral buckle; PPV pars plana vitrectomy; VH Vitreous hemorrhage; PRP panretinal laser photocoagulation; IVK intravitreal kenalog; VMT vitreomacular traction; MH Macular hole;  NVD neovascularization of the disc; NVE neovascularization elsewhere; AREDS age related eye disease study; ARMD age related macular degeneration; POAG primary open angle glaucoma; EBMD epithelial/anterior basement membrane dystrophy; ACIOL anterior chamber intraocular lens; IOL intraocular lens; PCIOL posterior chamber intraocular lens; Phaco/IOL phacoemulsification with intraocular lens placement; PRK photorefractive keratectomy; LASIK laser assisted in situ keratomileusis; HTN hypertension; DM diabetes mellitus; COPD chronic obstructive pulmonary disease

## 2021-05-16 NOTE — Assessment & Plan Note (Signed)
Improved CSME, and temporal area of nonperfusion well treated with PRP no signs of CNVM ?

## 2021-05-18 DIAGNOSIS — M9901 Segmental and somatic dysfunction of cervical region: Secondary | ICD-10-CM | POA: Diagnosis not present

## 2021-05-18 DIAGNOSIS — M47816 Spondylosis without myelopathy or radiculopathy, lumbar region: Secondary | ICD-10-CM | POA: Diagnosis not present

## 2021-05-18 DIAGNOSIS — S134XXA Sprain of ligaments of cervical spine, initial encounter: Secondary | ICD-10-CM | POA: Diagnosis not present

## 2021-05-18 DIAGNOSIS — S233XXA Sprain of ligaments of thoracic spine, initial encounter: Secondary | ICD-10-CM | POA: Diagnosis not present

## 2021-05-18 DIAGNOSIS — M9902 Segmental and somatic dysfunction of thoracic region: Secondary | ICD-10-CM | POA: Diagnosis not present

## 2021-05-18 DIAGNOSIS — M9903 Segmental and somatic dysfunction of lumbar region: Secondary | ICD-10-CM | POA: Diagnosis not present

## 2021-05-22 DIAGNOSIS — Z4542 Encounter for adjustment and management of neuropacemaker (brain) (peripheral nerve) (spinal cord): Secondary | ICD-10-CM | POA: Diagnosis not present

## 2021-05-22 DIAGNOSIS — G4733 Obstructive sleep apnea (adult) (pediatric): Secondary | ICD-10-CM | POA: Diagnosis not present

## 2021-06-01 DIAGNOSIS — S134XXA Sprain of ligaments of cervical spine, initial encounter: Secondary | ICD-10-CM | POA: Diagnosis not present

## 2021-06-01 DIAGNOSIS — M47816 Spondylosis without myelopathy or radiculopathy, lumbar region: Secondary | ICD-10-CM | POA: Diagnosis not present

## 2021-06-01 DIAGNOSIS — M9903 Segmental and somatic dysfunction of lumbar region: Secondary | ICD-10-CM | POA: Diagnosis not present

## 2021-06-01 DIAGNOSIS — M9901 Segmental and somatic dysfunction of cervical region: Secondary | ICD-10-CM | POA: Diagnosis not present

## 2021-06-01 DIAGNOSIS — M9902 Segmental and somatic dysfunction of thoracic region: Secondary | ICD-10-CM | POA: Diagnosis not present

## 2021-06-01 DIAGNOSIS — S233XXA Sprain of ligaments of thoracic spine, initial encounter: Secondary | ICD-10-CM | POA: Diagnosis not present

## 2021-06-15 DIAGNOSIS — S134XXA Sprain of ligaments of cervical spine, initial encounter: Secondary | ICD-10-CM | POA: Diagnosis not present

## 2021-06-15 DIAGNOSIS — M9902 Segmental and somatic dysfunction of thoracic region: Secondary | ICD-10-CM | POA: Diagnosis not present

## 2021-06-15 DIAGNOSIS — M47816 Spondylosis without myelopathy or radiculopathy, lumbar region: Secondary | ICD-10-CM | POA: Diagnosis not present

## 2021-06-15 DIAGNOSIS — M9903 Segmental and somatic dysfunction of lumbar region: Secondary | ICD-10-CM | POA: Diagnosis not present

## 2021-06-15 DIAGNOSIS — M9901 Segmental and somatic dysfunction of cervical region: Secondary | ICD-10-CM | POA: Diagnosis not present

## 2021-06-15 DIAGNOSIS — S233XXA Sprain of ligaments of thoracic spine, initial encounter: Secondary | ICD-10-CM | POA: Diagnosis not present

## 2021-06-29 DIAGNOSIS — M9901 Segmental and somatic dysfunction of cervical region: Secondary | ICD-10-CM | POA: Diagnosis not present

## 2021-06-29 DIAGNOSIS — S134XXA Sprain of ligaments of cervical spine, initial encounter: Secondary | ICD-10-CM | POA: Diagnosis not present

## 2021-06-29 DIAGNOSIS — M9902 Segmental and somatic dysfunction of thoracic region: Secondary | ICD-10-CM | POA: Diagnosis not present

## 2021-06-29 DIAGNOSIS — M47816 Spondylosis without myelopathy or radiculopathy, lumbar region: Secondary | ICD-10-CM | POA: Diagnosis not present

## 2021-06-29 DIAGNOSIS — M9903 Segmental and somatic dysfunction of lumbar region: Secondary | ICD-10-CM | POA: Diagnosis not present

## 2021-06-29 DIAGNOSIS — S233XXA Sprain of ligaments of thoracic spine, initial encounter: Secondary | ICD-10-CM | POA: Diagnosis not present

## 2021-07-10 DIAGNOSIS — E785 Hyperlipidemia, unspecified: Secondary | ICD-10-CM | POA: Diagnosis not present

## 2021-07-10 DIAGNOSIS — I10 Essential (primary) hypertension: Secondary | ICD-10-CM | POA: Diagnosis not present

## 2021-07-10 DIAGNOSIS — E538 Deficiency of other specified B group vitamins: Secondary | ICD-10-CM | POA: Diagnosis not present

## 2021-07-10 DIAGNOSIS — E559 Vitamin D deficiency, unspecified: Secondary | ICD-10-CM | POA: Diagnosis not present

## 2021-07-10 DIAGNOSIS — E1169 Type 2 diabetes mellitus with other specified complication: Secondary | ICD-10-CM | POA: Diagnosis not present

## 2021-07-10 DIAGNOSIS — E118 Type 2 diabetes mellitus with unspecified complications: Secondary | ICD-10-CM | POA: Diagnosis not present

## 2021-07-10 DIAGNOSIS — K760 Fatty (change of) liver, not elsewhere classified: Secondary | ICD-10-CM | POA: Diagnosis not present

## 2021-07-10 DIAGNOSIS — Z9884 Bariatric surgery status: Secondary | ICD-10-CM | POA: Diagnosis not present

## 2021-07-11 ENCOUNTER — Other Ambulatory Visit: Payer: PPO

## 2021-07-11 DIAGNOSIS — R972 Elevated prostate specific antigen [PSA]: Secondary | ICD-10-CM | POA: Diagnosis not present

## 2021-07-12 LAB — PSA: Prostate Specific Ag, Serum: 3.8 ng/mL (ref 0.0–4.0)

## 2021-07-13 DIAGNOSIS — M9902 Segmental and somatic dysfunction of thoracic region: Secondary | ICD-10-CM | POA: Diagnosis not present

## 2021-07-13 DIAGNOSIS — M9903 Segmental and somatic dysfunction of lumbar region: Secondary | ICD-10-CM | POA: Diagnosis not present

## 2021-07-13 DIAGNOSIS — S134XXA Sprain of ligaments of cervical spine, initial encounter: Secondary | ICD-10-CM | POA: Diagnosis not present

## 2021-07-13 DIAGNOSIS — S233XXA Sprain of ligaments of thoracic spine, initial encounter: Secondary | ICD-10-CM | POA: Diagnosis not present

## 2021-07-13 DIAGNOSIS — M47816 Spondylosis without myelopathy or radiculopathy, lumbar region: Secondary | ICD-10-CM | POA: Diagnosis not present

## 2021-07-13 DIAGNOSIS — M9901 Segmental and somatic dysfunction of cervical region: Secondary | ICD-10-CM | POA: Diagnosis not present

## 2021-07-14 DIAGNOSIS — E538 Deficiency of other specified B group vitamins: Secondary | ICD-10-CM | POA: Diagnosis not present

## 2021-07-14 DIAGNOSIS — R809 Proteinuria, unspecified: Secondary | ICD-10-CM | POA: Diagnosis not present

## 2021-07-14 DIAGNOSIS — E785 Hyperlipidemia, unspecified: Secondary | ICD-10-CM | POA: Diagnosis not present

## 2021-07-14 DIAGNOSIS — E1142 Type 2 diabetes mellitus with diabetic polyneuropathy: Secondary | ICD-10-CM | POA: Diagnosis not present

## 2021-07-14 DIAGNOSIS — I129 Hypertensive chronic kidney disease with stage 1 through stage 4 chronic kidney disease, or unspecified chronic kidney disease: Secondary | ICD-10-CM | POA: Diagnosis not present

## 2021-07-14 DIAGNOSIS — I2510 Atherosclerotic heart disease of native coronary artery without angina pectoris: Secondary | ICD-10-CM | POA: Diagnosis not present

## 2021-07-14 DIAGNOSIS — Z9884 Bariatric surgery status: Secondary | ICD-10-CM | POA: Diagnosis not present

## 2021-07-14 DIAGNOSIS — E1169 Type 2 diabetes mellitus with other specified complication: Secondary | ICD-10-CM | POA: Diagnosis not present

## 2021-07-14 DIAGNOSIS — E1122 Type 2 diabetes mellitus with diabetic chronic kidney disease: Secondary | ICD-10-CM | POA: Diagnosis not present

## 2021-07-14 DIAGNOSIS — N182 Chronic kidney disease, stage 2 (mild): Secondary | ICD-10-CM | POA: Diagnosis not present

## 2021-07-14 DIAGNOSIS — E1159 Type 2 diabetes mellitus with other circulatory complications: Secondary | ICD-10-CM | POA: Diagnosis not present

## 2021-07-14 DIAGNOSIS — Z7984 Long term (current) use of oral hypoglycemic drugs: Secondary | ICD-10-CM | POA: Diagnosis not present

## 2021-07-18 ENCOUNTER — Encounter: Payer: Self-pay | Admitting: Urology

## 2021-07-18 ENCOUNTER — Ambulatory Visit: Payer: PPO | Admitting: Urology

## 2021-07-18 VITALS — BP 111/69 | HR 98

## 2021-07-18 DIAGNOSIS — R972 Elevated prostate specific antigen [PSA]: Secondary | ICD-10-CM

## 2021-07-18 DIAGNOSIS — N401 Enlarged prostate with lower urinary tract symptoms: Secondary | ICD-10-CM | POA: Diagnosis not present

## 2021-07-18 DIAGNOSIS — N138 Other obstructive and reflux uropathy: Secondary | ICD-10-CM

## 2021-07-18 DIAGNOSIS — N529 Male erectile dysfunction, unspecified: Secondary | ICD-10-CM | POA: Diagnosis not present

## 2021-07-18 LAB — URINALYSIS, ROUTINE W REFLEX MICROSCOPIC
Bilirubin, UA: NEGATIVE
Leukocytes,UA: NEGATIVE
Nitrite, UA: NEGATIVE
Protein,UA: NEGATIVE
RBC, UA: NEGATIVE
Specific Gravity, UA: 1.01 (ref 1.005–1.030)
Urobilinogen, Ur: 0.2 mg/dL (ref 0.2–1.0)
pH, UA: 6 (ref 5.0–7.5)

## 2021-07-18 LAB — MICROSCOPIC EXAMINATION

## 2021-07-18 MED ORDER — SILDENAFIL CITRATE 20 MG PO TABS: ORAL_TABLET | ORAL | 11 refills | Status: DC

## 2021-07-18 NOTE — Progress Notes (Signed)
post void residual=81 

## 2021-07-18 NOTE — Progress Notes (Signed)
? ?Assessment: ?1. BPH with obstruction/lower urinary tract symptoms   ?2. Elevated PSA   ?3. Organic impotence   ? ?Plan: ?Continue tamsulosin 0.4 mg daily. ?We will continue to monitor PSA. ?Options for management of erectile dysfunction discussed including medical therapy, vacuum erection device, injection therapy, intraurethral suppository, and penile prosthesis.  He would like to try medical therapy. ?Trial of sildenafil 20 mg 3-5 tablets as needed intercourse.  Prescription sent. ?Return to office in 6 months with PSA. ? ?Chief Complaint:  ?Chief Complaint  ?Patient presents with  ? Benign Prostatic Hypertrophy  ? ? ?History of Present Illness: ? ?[name] is a 66 y.o. year old male who is seen for further evaluation of elevated PSA and BPH with obstruction.   ?PSA results: ?7/20 2.5 ?1/23 5.0 ?2/23 2.8 with 15.7% free ?5/23 3.8 ?No prior history of elevated PSA.  No history of UTIs or prostatitis.  No family history of prostate cancer. ? ?He does have lower urinary tract symptoms including frequency, nocturia 1-2 times, and decreased force of stream.  No dysuria or gross hematuria. ?IPSS = 7.  PVR = 251 ml. ? ?He was started on tamsulosin 0.4 mg daily at his visit in 2/23.  ? ?He returns today for follow-up.  He continues on tamsulosin.  He has not seen a significant change in his urinary symptoms.  He continues with nocturia 2 times per night.  No dysuria or gross hematuria. ?IPSS = 8 today. ? ?He also reports problems with achieving and maintaining erections.  This has been present for >1-year.  He is unable to achieve a satisfactory erection for intercourse.  No nocturnal or early morning erections.  No decrease in his libido.  He has not tried any therapy to date.  Risk factors include coronary artery disease, diabetes, hypertension, hyperlipidemia, and beta-blocker. ? ?Portions of the above documentation were copied from a prior visit for review purposes only. ? ? ?Past Medical History:  ?Past  Medical History:  ?Diagnosis Date  ? Arthritis   ? generalized  ? Coronary artery disease   ? stents placed 2003  ? Hyperlipidemia   ? Hypertension   ? Sleep apnea   ? on CPAP  ? ? ?Past Surgical History:  ?Past Surgical History:  ?Procedure Laterality Date  ? BARIATRIC SURGERY  12/2015  ? BICEPS TENDON REPAIR    ? CARDIAC CATHETERIZATION  2000  ? DRUG INDUCED ENDOSCOPY N/A 08/26/2020  ? Procedure: DRUG INDUCED ENDOSCOPY;  Surgeon: Osborn Coho, MD;  Location: [facility];  Service: ENT;  Laterality: N/A;  ? IMPLANTATION OF HYPOGLOSSAL NERVE STIMULATOR Right 01/25/2021  ? Procedure: IMPLANTATION OF HYPOGLOSSAL NERVE STIMULATOR;  Surgeon: Osborn Coho, MD;  Location[facility];  Service: ENT;  Laterality: Right;  ? KNEE ARTHROSCOPY Bilateral   ? septorhinoplasty    ? SHOULDER ARTHROSCOPY Bilateral   ? TONSILLECTOMY    ? UVULLA REMOVAL    ? ? ?Allergies:  ?No Known Allergies ? ?Family History:  ?Family History  ?Problem Relation Age of Onset  ? Diabetes Mother   ? Diabetes Father   ? Heart attack Father   ? ? ?Social History:  ?Social History  ? ?Tobacco Use  ? Smoking status: Never  ? Smokeless tobacco: Never  ?Vaping Use  ? Vaping Use: Never used  ?Substance Use Topics  ? Alcohol use: Not Currently  ?  Alcohol/week: 0.0 standard drinks  ? Drug use: Never  ? ? ?ROS: ?Constitutional:  Negative for fever,  chills, weight loss ?CV: Negative for chest pain, previous MI, hypertension ?Respiratory:  Negative for shortness of breath, wheezing, sleep apnea, frequent cough ?GI:  Negative for nausea, vomiting, bloody stool, GERD ? ?Physical exam: ?BP 111/69   Pulse 98  ?GENERAL APPEARANCE:  Well appearing, well developed, well nourished, NAD ?HEENT:  Atraumatic, normocephalic, oropharynx clear ?NECK:  Supple without lymphadenopathy or thyromegaly ?ABDOMEN:  Soft, non-tender, no masses ?EXTREMITIES:  Moves all extremities well, without clubbing, cyanosis, or edema ?NEUROLOGIC:  Alert and oriented x 3, normal  gait, CN II-XII grossly intact ?MENTAL STATUS:  appropriate ?BACK:  Non-tender to palpation, No CVAT ?SKIN:  Warm, dry, and intact ? ? ?Results: ?U/A:  0-5 WBC, 0-2 RBC, few bacteria ? ?PVR = 81 ml  ?

## 2021-07-27 DIAGNOSIS — M47816 Spondylosis without myelopathy or radiculopathy, lumbar region: Secondary | ICD-10-CM | POA: Diagnosis not present

## 2021-07-27 DIAGNOSIS — M9901 Segmental and somatic dysfunction of cervical region: Secondary | ICD-10-CM | POA: Diagnosis not present

## 2021-07-27 DIAGNOSIS — M9903 Segmental and somatic dysfunction of lumbar region: Secondary | ICD-10-CM | POA: Diagnosis not present

## 2021-07-27 DIAGNOSIS — S134XXA Sprain of ligaments of cervical spine, initial encounter: Secondary | ICD-10-CM | POA: Diagnosis not present

## 2021-07-27 DIAGNOSIS — S233XXA Sprain of ligaments of thoracic spine, initial encounter: Secondary | ICD-10-CM | POA: Diagnosis not present

## 2021-07-27 DIAGNOSIS — M9902 Segmental and somatic dysfunction of thoracic region: Secondary | ICD-10-CM | POA: Diagnosis not present

## 2021-08-01 DIAGNOSIS — M79672 Pain in left foot: Secondary | ICD-10-CM | POA: Diagnosis not present

## 2021-08-01 DIAGNOSIS — E1142 Type 2 diabetes mellitus with diabetic polyneuropathy: Secondary | ICD-10-CM | POA: Diagnosis not present

## 2021-08-01 DIAGNOSIS — L859 Epidermal thickening, unspecified: Secondary | ICD-10-CM | POA: Diagnosis not present

## 2021-08-10 DIAGNOSIS — M9903 Segmental and somatic dysfunction of lumbar region: Secondary | ICD-10-CM | POA: Diagnosis not present

## 2021-08-10 DIAGNOSIS — S134XXA Sprain of ligaments of cervical spine, initial encounter: Secondary | ICD-10-CM | POA: Diagnosis not present

## 2021-08-10 DIAGNOSIS — M47816 Spondylosis without myelopathy or radiculopathy, lumbar region: Secondary | ICD-10-CM | POA: Diagnosis not present

## 2021-08-10 DIAGNOSIS — M9902 Segmental and somatic dysfunction of thoracic region: Secondary | ICD-10-CM | POA: Diagnosis not present

## 2021-08-10 DIAGNOSIS — S233XXA Sprain of ligaments of thoracic spine, initial encounter: Secondary | ICD-10-CM | POA: Diagnosis not present

## 2021-08-10 DIAGNOSIS — M9901 Segmental and somatic dysfunction of cervical region: Secondary | ICD-10-CM | POA: Diagnosis not present

## 2021-08-24 DIAGNOSIS — S134XXA Sprain of ligaments of cervical spine, initial encounter: Secondary | ICD-10-CM | POA: Diagnosis not present

## 2021-08-24 DIAGNOSIS — M9901 Segmental and somatic dysfunction of cervical region: Secondary | ICD-10-CM | POA: Diagnosis not present

## 2021-08-24 DIAGNOSIS — M9902 Segmental and somatic dysfunction of thoracic region: Secondary | ICD-10-CM | POA: Diagnosis not present

## 2021-08-24 DIAGNOSIS — S233XXA Sprain of ligaments of thoracic spine, initial encounter: Secondary | ICD-10-CM | POA: Diagnosis not present

## 2021-08-24 DIAGNOSIS — M9903 Segmental and somatic dysfunction of lumbar region: Secondary | ICD-10-CM | POA: Diagnosis not present

## 2021-08-24 DIAGNOSIS — M47816 Spondylosis without myelopathy or radiculopathy, lumbar region: Secondary | ICD-10-CM | POA: Diagnosis not present

## 2021-09-07 DIAGNOSIS — M47816 Spondylosis without myelopathy or radiculopathy, lumbar region: Secondary | ICD-10-CM | POA: Diagnosis not present

## 2021-09-07 DIAGNOSIS — G4733 Obstructive sleep apnea (adult) (pediatric): Secondary | ICD-10-CM | POA: Diagnosis not present

## 2021-09-07 DIAGNOSIS — S233XXA Sprain of ligaments of thoracic spine, initial encounter: Secondary | ICD-10-CM | POA: Diagnosis not present

## 2021-09-07 DIAGNOSIS — M9901 Segmental and somatic dysfunction of cervical region: Secondary | ICD-10-CM | POA: Diagnosis not present

## 2021-09-07 DIAGNOSIS — M9903 Segmental and somatic dysfunction of lumbar region: Secondary | ICD-10-CM | POA: Diagnosis not present

## 2021-09-07 DIAGNOSIS — S134XXA Sprain of ligaments of cervical spine, initial encounter: Secondary | ICD-10-CM | POA: Diagnosis not present

## 2021-09-07 DIAGNOSIS — M9902 Segmental and somatic dysfunction of thoracic region: Secondary | ICD-10-CM | POA: Diagnosis not present

## 2021-09-18 DIAGNOSIS — E113313 Type 2 diabetes mellitus with moderate nonproliferative diabetic retinopathy with macular edema, bilateral: Secondary | ICD-10-CM | POA: Diagnosis not present

## 2021-09-18 DIAGNOSIS — K760 Fatty (change of) liver, not elsewhere classified: Secondary | ICD-10-CM | POA: Diagnosis not present

## 2021-09-18 DIAGNOSIS — R5383 Other fatigue: Secondary | ICD-10-CM | POA: Diagnosis not present

## 2021-09-18 DIAGNOSIS — Z1329 Encounter for screening for other suspected endocrine disorder: Secondary | ICD-10-CM | POA: Diagnosis not present

## 2021-09-18 DIAGNOSIS — E7849 Other hyperlipidemia: Secondary | ICD-10-CM | POA: Diagnosis not present

## 2021-09-18 DIAGNOSIS — Z0001 Encounter for general adult medical examination with abnormal findings: Secondary | ICD-10-CM | POA: Diagnosis not present

## 2021-09-18 DIAGNOSIS — R972 Elevated prostate specific antigen [PSA]: Secondary | ICD-10-CM | POA: Diagnosis not present

## 2021-09-18 DIAGNOSIS — I10 Essential (primary) hypertension: Secondary | ICD-10-CM | POA: Diagnosis not present

## 2021-09-18 DIAGNOSIS — E782 Mixed hyperlipidemia: Secondary | ICD-10-CM | POA: Diagnosis not present

## 2021-09-21 DIAGNOSIS — M9901 Segmental and somatic dysfunction of cervical region: Secondary | ICD-10-CM | POA: Diagnosis not present

## 2021-09-21 DIAGNOSIS — S233XXA Sprain of ligaments of thoracic spine, initial encounter: Secondary | ICD-10-CM | POA: Diagnosis not present

## 2021-09-21 DIAGNOSIS — S134XXA Sprain of ligaments of cervical spine, initial encounter: Secondary | ICD-10-CM | POA: Diagnosis not present

## 2021-09-21 DIAGNOSIS — M9902 Segmental and somatic dysfunction of thoracic region: Secondary | ICD-10-CM | POA: Diagnosis not present

## 2021-09-21 DIAGNOSIS — M47816 Spondylosis without myelopathy or radiculopathy, lumbar region: Secondary | ICD-10-CM | POA: Diagnosis not present

## 2021-09-21 DIAGNOSIS — M9903 Segmental and somatic dysfunction of lumbar region: Secondary | ICD-10-CM | POA: Diagnosis not present

## 2021-09-25 DIAGNOSIS — Z4542 Encounter for adjustment and management of neuropacemaker (brain) (peripheral nerve) (spinal cord): Secondary | ICD-10-CM | POA: Diagnosis not present

## 2021-09-25 DIAGNOSIS — G4733 Obstructive sleep apnea (adult) (pediatric): Secondary | ICD-10-CM | POA: Diagnosis not present

## 2021-09-28 DIAGNOSIS — R0982 Postnasal drip: Secondary | ICD-10-CM | POA: Diagnosis not present

## 2021-09-28 DIAGNOSIS — Z9884 Bariatric surgery status: Secondary | ICD-10-CM | POA: Diagnosis not present

## 2021-09-28 DIAGNOSIS — I10 Essential (primary) hypertension: Secondary | ICD-10-CM | POA: Diagnosis not present

## 2021-09-28 DIAGNOSIS — K219 Gastro-esophageal reflux disease without esophagitis: Secondary | ICD-10-CM | POA: Diagnosis not present

## 2021-09-28 DIAGNOSIS — R972 Elevated prostate specific antigen [PSA]: Secondary | ICD-10-CM | POA: Diagnosis not present

## 2021-09-28 DIAGNOSIS — Z955 Presence of coronary angioplasty implant and graft: Secondary | ICD-10-CM | POA: Diagnosis not present

## 2021-09-28 DIAGNOSIS — K760 Fatty (change of) liver, not elsewhere classified: Secondary | ICD-10-CM | POA: Diagnosis not present

## 2021-09-28 DIAGNOSIS — E1143 Type 2 diabetes mellitus with diabetic autonomic (poly)neuropathy: Secondary | ICD-10-CM | POA: Diagnosis not present

## 2021-09-28 DIAGNOSIS — E113313 Type 2 diabetes mellitus with moderate nonproliferative diabetic retinopathy with macular edema, bilateral: Secondary | ICD-10-CM | POA: Diagnosis not present

## 2021-09-28 DIAGNOSIS — I2510 Atherosclerotic heart disease of native coronary artery without angina pectoris: Secondary | ICD-10-CM | POA: Diagnosis not present

## 2021-09-28 DIAGNOSIS — Z0001 Encounter for general adult medical examination with abnormal findings: Secondary | ICD-10-CM | POA: Diagnosis not present

## 2021-09-28 DIAGNOSIS — E7849 Other hyperlipidemia: Secondary | ICD-10-CM | POA: Diagnosis not present

## 2021-10-05 DIAGNOSIS — M9901 Segmental and somatic dysfunction of cervical region: Secondary | ICD-10-CM | POA: Diagnosis not present

## 2021-10-05 DIAGNOSIS — M47816 Spondylosis without myelopathy or radiculopathy, lumbar region: Secondary | ICD-10-CM | POA: Diagnosis not present

## 2021-10-05 DIAGNOSIS — M9902 Segmental and somatic dysfunction of thoracic region: Secondary | ICD-10-CM | POA: Diagnosis not present

## 2021-10-05 DIAGNOSIS — M9903 Segmental and somatic dysfunction of lumbar region: Secondary | ICD-10-CM | POA: Diagnosis not present

## 2021-10-05 DIAGNOSIS — S233XXA Sprain of ligaments of thoracic spine, initial encounter: Secondary | ICD-10-CM | POA: Diagnosis not present

## 2021-10-05 DIAGNOSIS — S134XXA Sprain of ligaments of cervical spine, initial encounter: Secondary | ICD-10-CM | POA: Diagnosis not present

## 2021-10-19 DIAGNOSIS — M9903 Segmental and somatic dysfunction of lumbar region: Secondary | ICD-10-CM | POA: Diagnosis not present

## 2021-10-19 DIAGNOSIS — M9901 Segmental and somatic dysfunction of cervical region: Secondary | ICD-10-CM | POA: Diagnosis not present

## 2021-10-19 DIAGNOSIS — S233XXA Sprain of ligaments of thoracic spine, initial encounter: Secondary | ICD-10-CM | POA: Diagnosis not present

## 2021-10-19 DIAGNOSIS — S134XXA Sprain of ligaments of cervical spine, initial encounter: Secondary | ICD-10-CM | POA: Diagnosis not present

## 2021-10-19 DIAGNOSIS — M9902 Segmental and somatic dysfunction of thoracic region: Secondary | ICD-10-CM | POA: Diagnosis not present

## 2021-10-19 DIAGNOSIS — M47816 Spondylosis without myelopathy or radiculopathy, lumbar region: Secondary | ICD-10-CM | POA: Diagnosis not present

## 2021-10-23 ENCOUNTER — Other Ambulatory Visit: Payer: Self-pay | Admitting: *Deleted

## 2021-10-23 NOTE — Patient Outreach (Signed)
  Care Coordination   10/23/2021  Name: JOHNATTAN STRASSMAN MRN: [id number] DOB: [birth year]   Care Coordination Outreach Attempts:  An unsuccessful telephone outreach was attempted today to offer the patient information about available care coordination services as a benefit of their health plan. HIPAA compliant messages left on voicemail, providing contact information for CSW, encouraging patient to return CSW's call at his earliest convenience.  Follow Up Plan:  Additional outreach attempts will be made to offer the patient care coordination information and services.   Encounter Outcome:  No Answer.   Care Coordination Interventions Activated:  No.    Care Coordination Interventions:  No, not indicated.    Danford Bad, BSW, MSW, LCSW  Licensed Restaurant manager, [employer] [facility]  Mailing [place] [address], [place], [place] [address] Physical Address[address], [place], [place] [address] Toll Free Main # [phone] Fax # [phone] Cell # [phone] [email or web address]

## 2021-10-26 ENCOUNTER — Other Ambulatory Visit: Payer: Self-pay | Admitting: *Deleted

## 2021-10-26 NOTE — Patient Outreach (Signed)
  Care Coordination   10/26/2021  Name: WERNER LABELLA MRN: [id number] DOB: [birth year]   Care Coordination Outreach Attempts:  A second unsuccessful outreach was attempted today to offer the patient with information about available care coordination services as a benefit of their health plan.   HIPAA compliant message left on voicemail, providing contact information for CSW, encouraging patient to return CSW's call at his earliest convenience.  Follow Up Plan:  Additional outreach attempts will be made to offer the patient care coordination information and services.   Encounter Outcome:  No Answer.   Care Coordination Interventions Activated:  No.    Care Coordination Interventions:  No, not indicated.    Danford Bad, BSW, MSW, LCSW  Licensed Restaurant manager, [employer] [facility]  Mailing [place] [address] [place], [place] [address] Physical Address[address] [place], [place] [address] Toll Free Main # [phone] Fax # [phone] Cell # [phone] [email or web address]

## 2021-11-02 DIAGNOSIS — M9903 Segmental and somatic dysfunction of lumbar region: Secondary | ICD-10-CM | POA: Diagnosis not present

## 2021-11-02 DIAGNOSIS — M9902 Segmental and somatic dysfunction of thoracic region: Secondary | ICD-10-CM | POA: Diagnosis not present

## 2021-11-02 DIAGNOSIS — S134XXA Sprain of ligaments of cervical spine, initial encounter: Secondary | ICD-10-CM | POA: Diagnosis not present

## 2021-11-02 DIAGNOSIS — S233XXA Sprain of ligaments of thoracic spine, initial encounter: Secondary | ICD-10-CM | POA: Diagnosis not present

## 2021-11-02 DIAGNOSIS — M47816 Spondylosis without myelopathy or radiculopathy, lumbar region: Secondary | ICD-10-CM | POA: Diagnosis not present

## 2021-11-02 DIAGNOSIS — M9901 Segmental and somatic dysfunction of cervical region: Secondary | ICD-10-CM | POA: Diagnosis not present

## 2021-11-05 ENCOUNTER — Other Ambulatory Visit: Payer: Self-pay | Admitting: *Deleted

## 2021-11-05 NOTE — Patient Outreach (Signed)
  Care Coordination   11/05/2021  Name: [name] MRN: [id number] DOB: 1955/09/16   Care Coordination Outreach Attempts:  A third unsuccessful outreach was attempted today to offer the patient with information about available care coordination services as a benefit of their health plan. HIPAA compliant messages left on voicemail, providing contact information for CSW, encouraging patient to return CSW's call at his earliest convenience.  Follow Up Plan:  No further outreach attempts will be made at this time. We have been unable to contact the patient to offer or enroll patient in care coordination services.  Encounter Outcome:  No Answer.   Care Coordination Interventions Activated:  No.    Care Coordination Interventions:  No, not indicated.    Danford Bad, BSW, MSW, LCSW  Licensed Restaurant manager, [employer] [facility]  Mailing [place] [address] [place], [place] [address] Physical Address-[address] [place], [place] [address] Toll Free Main # [phone] Fax # [phone] Cell # [phone] [email or web address]

## 2021-11-06 DIAGNOSIS — N181 Chronic kidney disease, stage 1: Secondary | ICD-10-CM | POA: Diagnosis not present

## 2021-11-06 DIAGNOSIS — Z4542 Encounter for adjustment and management of neuropacemaker (brain) (peripheral nerve) (spinal cord): Secondary | ICD-10-CM | POA: Diagnosis not present

## 2021-11-06 DIAGNOSIS — E1169 Type 2 diabetes mellitus with other specified complication: Secondary | ICD-10-CM | POA: Diagnosis not present

## 2021-11-06 DIAGNOSIS — E118 Type 2 diabetes mellitus with unspecified complications: Secondary | ICD-10-CM | POA: Diagnosis not present

## 2021-11-06 DIAGNOSIS — G4733 Obstructive sleep apnea (adult) (pediatric): Secondary | ICD-10-CM | POA: Diagnosis not present

## 2021-11-06 DIAGNOSIS — E1122 Type 2 diabetes mellitus with diabetic chronic kidney disease: Secondary | ICD-10-CM | POA: Diagnosis not present

## 2021-11-06 DIAGNOSIS — E785 Hyperlipidemia, unspecified: Secondary | ICD-10-CM | POA: Diagnosis not present

## 2021-11-06 DIAGNOSIS — E1140 Type 2 diabetes mellitus with diabetic neuropathy, unspecified: Secondary | ICD-10-CM | POA: Diagnosis not present

## 2021-11-06 DIAGNOSIS — E1129 Type 2 diabetes mellitus with other diabetic kidney complication: Secondary | ICD-10-CM | POA: Diagnosis not present

## 2021-11-06 DIAGNOSIS — I129 Hypertensive chronic kidney disease with stage 1 through stage 4 chronic kidney disease, or unspecified chronic kidney disease: Secondary | ICD-10-CM | POA: Diagnosis not present

## 2021-11-06 DIAGNOSIS — E559 Vitamin D deficiency, unspecified: Secondary | ICD-10-CM | POA: Diagnosis not present

## 2021-11-06 DIAGNOSIS — R809 Proteinuria, unspecified: Secondary | ICD-10-CM | POA: Diagnosis not present

## 2021-11-06 DIAGNOSIS — E538 Deficiency of other specified B group vitamins: Secondary | ICD-10-CM | POA: Diagnosis not present

## 2021-11-06 DIAGNOSIS — K760 Fatty (change of) liver, not elsewhere classified: Secondary | ICD-10-CM | POA: Diagnosis not present

## 2021-11-07 DIAGNOSIS — G4733 Obstructive sleep apnea (adult) (pediatric): Secondary | ICD-10-CM | POA: Diagnosis not present

## 2021-11-14 DIAGNOSIS — Z7984 Long term (current) use of oral hypoglycemic drugs: Secondary | ICD-10-CM | POA: Diagnosis not present

## 2021-11-14 DIAGNOSIS — R809 Proteinuria, unspecified: Secondary | ICD-10-CM | POA: Diagnosis not present

## 2021-11-14 DIAGNOSIS — E1122 Type 2 diabetes mellitus with diabetic chronic kidney disease: Secondary | ICD-10-CM | POA: Diagnosis not present

## 2021-11-14 DIAGNOSIS — N182 Chronic kidney disease, stage 2 (mild): Secondary | ICD-10-CM | POA: Diagnosis not present

## 2021-11-14 DIAGNOSIS — I2510 Atherosclerotic heart disease of native coronary artery without angina pectoris: Secondary | ICD-10-CM | POA: Diagnosis not present

## 2021-11-14 DIAGNOSIS — E1169 Type 2 diabetes mellitus with other specified complication: Secondary | ICD-10-CM | POA: Diagnosis not present

## 2021-11-14 DIAGNOSIS — I129 Hypertensive chronic kidney disease with stage 1 through stage 4 chronic kidney disease, or unspecified chronic kidney disease: Secondary | ICD-10-CM | POA: Diagnosis not present

## 2021-11-14 DIAGNOSIS — E1140 Type 2 diabetes mellitus with diabetic neuropathy, unspecified: Secondary | ICD-10-CM | POA: Diagnosis not present

## 2021-11-14 DIAGNOSIS — E1159 Type 2 diabetes mellitus with other circulatory complications: Secondary | ICD-10-CM | POA: Diagnosis not present

## 2021-11-14 DIAGNOSIS — E785 Hyperlipidemia, unspecified: Secondary | ICD-10-CM | POA: Diagnosis not present

## 2021-11-15 ENCOUNTER — Encounter (INDEPENDENT_AMBULATORY_CARE_PROVIDER_SITE_OTHER): Payer: Self-pay | Admitting: Ophthalmology

## 2021-11-15 ENCOUNTER — Ambulatory Visit (INDEPENDENT_AMBULATORY_CARE_PROVIDER_SITE_OTHER): Payer: PPO | Admitting: Ophthalmology

## 2021-11-15 DIAGNOSIS — E113411 Type 2 diabetes mellitus with severe nonproliferative diabetic retinopathy with macular edema, right eye: Secondary | ICD-10-CM | POA: Diagnosis not present

## 2021-11-15 DIAGNOSIS — E113492 Type 2 diabetes mellitus with severe nonproliferative diabetic retinopathy without macular edema, left eye: Secondary | ICD-10-CM

## 2021-11-15 NOTE — Assessment & Plan Note (Signed)
OD, severe NPDR no sign of progression.  No recurrence of CSME continue to monitor

## 2021-11-15 NOTE — Progress Notes (Signed)
11/15/2021     CHIEF COMPLAINT Patient presents for  Chief Complaint  Patient presents with   Diabetic Retinopathy with Macular Edema      HISTORY OF PRESENT ILLNESS: [name] is a 66 y.o. male who presents to the clinic today for:   HPI   5 MOS for DILATE OU, COLOR FP, OCT. Pt stated, "I feel like I cannot see as well out of my left eye like it used to be. It feels full, like something is inside in my eye. That has been going on for a couple of months as well. It seems as if its taking longer to focus my left eye."  Last edited by Angeline Slim on 11/15/2021  8:39 AM.      Referring physician: Juliette Alcide, MD [address] [place],  [place] [address]  HISTORICAL INFORMATION:   Selected notes from the MEDICAL RECORD NUMBER    Lab Results  Component Value Date   HGBA1C (H) 02/19/2007    8.5 (NOTE)   The ADA recommends the following therapeutic goals for glycemic   control related to Hgb A1C measurement:   Goal of Therapy:   < 7.0% Hgb A1C   Action Suggested:  > 8.0% Hgb A1C   Ref:  Diabetes Care, 22, Suppl. 1, 1999     CURRENT MEDICATIONS: Current Outpatient Medications (Ophthalmic Drugs)  Medication Sig   Polyethyl Glycol-Propyl Glycol (SYSTANE OP) Place 1 drop into both eyes daily as needed (dry eyes).   No current facility-administered medications for this visit. (Ophthalmic Drugs)   Current Outpatient Medications (Other)  Medication Sig   aspirin 81 MG tablet Take 81 mg by mouth daily.   b complex vitamins tablet Take 1 tablet by mouth daily.   Calcium 500-100 MG-UNIT CHEW Chew 1 tablet by mouth 4 (four) times daily.   Cholecalciferol (VITAMIN D) 50 MCG (2000 UT) tablet Take 6,000 Units by mouth daily.   empagliflozin (JARDIANCE) 25 MG TABS tablet Take 12.5 mg by mouth daily.   ferrous sulfate 325 (65 FE) MG EC tablet Take 325 mg by mouth daily.   icosapent Ethyl (VASCEPA) 1 g capsule Take 1 g by mouth 3 (three) times daily.   Magnesium 250 MG TABS Take 250 mg by  mouth daily.   metFORMIN (GLUCOPHAGE) 850 MG tablet Take 850 mg by mouth 2 (two) times daily with a meal.   metoprolol succinate (TOPROL-XL) 25 MG 24 hr tablet TAKE 1 TABLET BY MOUTH  DAILY   Multiple Vitamin (MULTIVITAMIN) tablet Take 1 tablet by mouth 2 (two) times daily.   potassium chloride SA (K-DUR,KLOR-CON) 20 MEQ tablet Take 20 mEq by mouth 2 (two) times daily.    ramipril (ALTACE) 2.5 MG capsule TAKE 1 CAPSULE BY MOUTH  DAILY   rosuvastatin (CRESTOR) 20 MG tablet TAKE 1 TABLET(20 MG) BY MOUTH DAILY   sildenafil (REVATIO) 20 MG tablet Take 3-5 tablets by mouth 30-60 minutes before intercourse   tamsulosin (FLOMAX) 0.4 MG CAPS capsule Take 1 capsule (0.4 mg total) by mouth daily.   vitamin B-12 (CYANOCOBALAMIN) 1000 MCG tablet Take 1,000 mcg by mouth daily.   No current facility-administered medications for this visit. (Other)      REVIEW OF SYSTEMS: ROS   Negative for: Constitutional, Gastrointestinal, Neurological, Skin, Genitourinary, Musculoskeletal, HENT, Endocrine, Cardiovascular, Eyes, Respiratory, Psychiatric, Allergic/Imm, Heme/Lymph Last edited by Angeline Slim on 11/15/2021  8:39 AM.       ALLERGIES No Known Allergies  PAST MEDICAL HISTORY  Past Medical History:  Diagnosis Date   Arthritis    generalized   Coronary artery disease    stents placed 2003   Hyperlipidemia    Hypertension    Sleep apnea    on CPAP   Past Surgical History:  Procedure Laterality Date   BARIATRIC SURGERY  12/2015   BICEPS TENDON REPAIR     CARDIAC CATHETERIZATION  2000   DRUG INDUCED ENDOSCOPY N/A 08/26/2020   Procedure: DRUG INDUCED ENDOSCOPY;  Surgeon: Osborn Coho, MD;  Location: [facility];  Service: ENT;  Laterality: N/A;   IMPLANTATION OF HYPOGLOSSAL NERVE STIMULATOR Right 01/25/2021   Procedure: IMPLANTATION OF HYPOGLOSSAL NERVE STIMULATOR;  Surgeon: Osborn Coho, MD;  Location: [facility] OR;  Service: ENT;  Laterality: Right;   KNEE ARTHROSCOPY Bilateral     septorhinoplasty     SHOULDER ARTHROSCOPY Bilateral    TONSILLECTOMY     UVULLA REMOVAL      FAMILY HISTORY Family History  Problem Relation Age of Onset   Diabetes Mother    Diabetes Father    Heart attack Father     SOCIAL HISTORY Social History   Tobacco Use   Smoking status: Never   Smokeless tobacco: Never  Vaping Use   Vaping Use: Never used  Substance Use Topics   Alcohol use: Not Currently    Alcohol/week: 0.0 standard drinks of alcohol   Drug use: Never         OPHTHALMIC EXAM:  Base Eye Exam     Visual Acuity (ETDRS)       Right Left   Dist [place] 20/25 -1 20/80   Dist ph [place]  20/30 -1         Tonometry (Tonopen, 8:43 AM)       Right Left   Pressure 14 14         Pupils       Pupils Dark Light Shape React APD   Right PERRL 4 3 Round Brisk None   Left PERRL 4 3 Round Brisk None         Visual Fields       Left Right    Full Full         Extraocular Movement       Right Left    Full, Ortho Full, Ortho         Neuro/Psych     Oriented x3: Yes   Mood/Affect: Normal         Dilation     Both eyes: 1.0% Mydriacyl, 2.5% Phenylephrine @ 8:43 AM           Slit Lamp and Fundus Exam     External Exam       Right Left   External Normal Normal         Slit Lamp Exam       Right Left   Lids/Lashes Normal Normal   Conjunctiva/Sclera White and quiet White and quiet   Cornea Clear Clear   Anterior Chamber Deep and quiet Deep and quiet   Iris Round and reactive Round and reactive   Lens Posterior chamber intraocular lens Posterior chamber intraocular lens   Anterior Vitreous Normal Normal         Fundus Exam       Right Left   Posterior Vitreous Normal Posterior vitreous detachment   Disc Normal Normal   C/D Ratio 0.65 0.65   Macula Microaneurysms, no clinically significant macular edema, Focal laser scars Microaneurysms, no  macular thickening, no clinically significant macular edema, Focal laser scars    Vessels Severe NPDR Severe NPDR   Periphery Normal, no N/V Normal good peripheral PRP anteriorly temporally, no N/V            IMAGING AND PROCEDURES  Imaging and Procedures for 11/15/21  OCT, Retina - OU - Both Eyes       Right Eye Quality was good. Scan locations included subfoveal. Central Foveal Thickness: 278. Progression has been stable. Findings include abnormal foveal contour.   Left Eye Quality was good. Scan locations included subfoveal. Central Foveal Thickness: 276. Progression has been stable. Findings include abnormal foveal contour.   Notes OD, focal area of CSME temporally, resolved as compared to 5 months previous.  We will continue to monitor and observe  OS no sign of CSME     Color Fundus Photography Optos - OU - Both Eyes       Right Eye Progression has been stable. Disc findings include increased cup to disc ratio. Macula : normal observations. Vessels : IRMA.   Left Eye Progression has been stable. Disc findings include increased cup to disc ratio. Macula : normal observations. Vessels : IRMA.   Notes Good PRP temporally OS             ASSESSMENT/PLAN:  Severe nonproliferative diabetic retinopathy of right eye, with macular edema, associated with type 2 diabetes mellitus (HCC) OD, severe NPDR no sign of progression.  No recurrence of CSME continue to monitor  Severe nonproliferative diabetic retinopathy of left eye without macular edema associated with type 2 diabetes mellitus (HCC) OS, severe NPDR, treatment area temporally in the watershed region of nonperfusion, no sign of NVE progression.  Will observe, no sign of recurrent CSME     ICD-10-CM   1. Severe nonproliferative diabetic retinopathy of right eye, with macular edema, associated with type 2 diabetes mellitus (HCC)  E11.3411 OCT, Retina - OU - Both Eyes    Color Fundus Photography Optos - OU - Both Eyes    2. Severe nonproliferative diabetic retinopathy of left eye without  macular edema associated with type 2 diabetes mellitus (HCC)  Q76.1950       1.  OU with excellent acuity.  No sign of significant CSME returning.  Continue to monitor and follow.  2.  3.  Ophthalmic Meds Ordered this visit:  No orders of the defined types were placed in this encounter.      Return in about 6 months (around 05/16/2022) for DILATE OU, COLOR FP, OCT.  There are no Patient Instructions on file for this visit.   Explained the diagnoses, plan, and follow up with the patient and they expressed understanding.  Patient expressed understanding of the importance of proper follow up care.   Alford Highland Hadessah Grennan M.D. Diseases & Surgery of the Retina and Vitreous [facility] 11/15/21     Abbreviations: M myopia (nearsighted); A astigmatism; H hyperopia (farsighted); P presbyopia; Mrx spectacle prescription;  CTL contact lenses; OD right eye; OS left eye; OU both eyes  XT exotropia; ET esotropia; PEK punctate epithelial keratitis; PEE punctate epithelial erosions; DES dry eye syndrome; MGD meibomian gland dysfunction; ATs artificial tears; PFAT's preservative free artificial tears; NSC nuclear sclerotic cataract; PSC posterior subcapsular cataract; ERM epi-retinal membrane; PVD posterior vitreous detachment; RD retinal detachment; DM diabetes mellitus; DR diabetic retinopathy; NPDR non-proliferative diabetic retinopathy; PDR proliferative diabetic retinopathy; CSME clinically significant macular edema; DME diabetic macular edema; dbh dot blot hemorrhages;  CWS cotton wool spot; POAG primary open angle glaucoma; C/D cup-to-disc ratio; HVF humphrey visual field; GVF goldmann visual field; OCT optical coherence tomography; IOP intraocular pressure; BRVO Branch retinal vein occlusion; CRVO central retinal vein occlusion; CRAO central retinal artery occlusion; BRAO branch retinal artery occlusion; RT retinal tear; SB scleral buckle; PPV pars plana vitrectomy; VH Vitreous  hemorrhage; PRP panretinal laser photocoagulation; IVK intravitreal kenalog; VMT vitreomacular traction; MH Macular hole;  NVD neovascularization of the disc; NVE neovascularization elsewhere; AREDS age related eye disease study; ARMD age related macular degeneration; POAG primary open angle glaucoma; EBMD epithelial/anterior basement membrane dystrophy; ACIOL anterior chamber intraocular lens; IOL intraocular lens; PCIOL posterior chamber intraocular lens; Phaco/IOL phacoemulsification with intraocular lens placement; [place] photorefractive keratectomy; LASIK laser assisted in situ keratomileusis; HTN hypertension; DM diabetes mellitus; COPD chronic obstructive pulmonary disease

## 2021-11-15 NOTE — Assessment & Plan Note (Signed)
OS, severe NPDR, treatment area temporally in the watershed region of nonperfusion, no sign of NVE progression.  Will observe, no sign of recurrent CSME

## 2021-11-16 DIAGNOSIS — M9901 Segmental and somatic dysfunction of cervical region: Secondary | ICD-10-CM | POA: Diagnosis not present

## 2021-11-16 DIAGNOSIS — S233XXA Sprain of ligaments of thoracic spine, initial encounter: Secondary | ICD-10-CM | POA: Diagnosis not present

## 2021-11-16 DIAGNOSIS — M47816 Spondylosis without myelopathy or radiculopathy, lumbar region: Secondary | ICD-10-CM | POA: Diagnosis not present

## 2021-11-16 DIAGNOSIS — M9903 Segmental and somatic dysfunction of lumbar region: Secondary | ICD-10-CM | POA: Diagnosis not present

## 2021-11-16 DIAGNOSIS — S134XXA Sprain of ligaments of cervical spine, initial encounter: Secondary | ICD-10-CM | POA: Diagnosis not present

## 2021-11-16 DIAGNOSIS — M9902 Segmental and somatic dysfunction of thoracic region: Secondary | ICD-10-CM | POA: Diagnosis not present

## 2021-11-28 DIAGNOSIS — E083313 Diabetes mellitus due to underlying condition with moderate nonproliferative diabetic retinopathy with macular edema, bilateral: Secondary | ICD-10-CM | POA: Diagnosis not present

## 2021-12-15 DIAGNOSIS — M9901 Segmental and somatic dysfunction of cervical region: Secondary | ICD-10-CM | POA: Diagnosis not present

## 2021-12-15 DIAGNOSIS — M9903 Segmental and somatic dysfunction of lumbar region: Secondary | ICD-10-CM | POA: Diagnosis not present

## 2021-12-15 DIAGNOSIS — M47816 Spondylosis without myelopathy or radiculopathy, lumbar region: Secondary | ICD-10-CM | POA: Diagnosis not present

## 2021-12-15 DIAGNOSIS — M9902 Segmental and somatic dysfunction of thoracic region: Secondary | ICD-10-CM | POA: Diagnosis not present

## 2021-12-15 DIAGNOSIS — S233XXA Sprain of ligaments of thoracic spine, initial encounter: Secondary | ICD-10-CM | POA: Diagnosis not present

## 2021-12-15 DIAGNOSIS — S134XXA Sprain of ligaments of cervical spine, initial encounter: Secondary | ICD-10-CM | POA: Diagnosis not present

## 2021-12-28 DIAGNOSIS — M9903 Segmental and somatic dysfunction of lumbar region: Secondary | ICD-10-CM | POA: Diagnosis not present

## 2021-12-28 DIAGNOSIS — S134XXA Sprain of ligaments of cervical spine, initial encounter: Secondary | ICD-10-CM | POA: Diagnosis not present

## 2021-12-28 DIAGNOSIS — S233XXA Sprain of ligaments of thoracic spine, initial encounter: Secondary | ICD-10-CM | POA: Diagnosis not present

## 2021-12-28 DIAGNOSIS — M9901 Segmental and somatic dysfunction of cervical region: Secondary | ICD-10-CM | POA: Diagnosis not present

## 2021-12-28 DIAGNOSIS — M47816 Spondylosis without myelopathy or radiculopathy, lumbar region: Secondary | ICD-10-CM | POA: Diagnosis not present

## 2021-12-28 DIAGNOSIS — M9902 Segmental and somatic dysfunction of thoracic region: Secondary | ICD-10-CM | POA: Diagnosis not present

## 2022-01-11 DIAGNOSIS — M9902 Segmental and somatic dysfunction of thoracic region: Secondary | ICD-10-CM | POA: Diagnosis not present

## 2022-01-11 DIAGNOSIS — M47816 Spondylosis without myelopathy or radiculopathy, lumbar region: Secondary | ICD-10-CM | POA: Diagnosis not present

## 2022-01-11 DIAGNOSIS — M9903 Segmental and somatic dysfunction of lumbar region: Secondary | ICD-10-CM | POA: Diagnosis not present

## 2022-01-11 DIAGNOSIS — S134XXA Sprain of ligaments of cervical spine, initial encounter: Secondary | ICD-10-CM | POA: Diagnosis not present

## 2022-01-11 DIAGNOSIS — S233XXA Sprain of ligaments of thoracic spine, initial encounter: Secondary | ICD-10-CM | POA: Diagnosis not present

## 2022-01-11 DIAGNOSIS — M9901 Segmental and somatic dysfunction of cervical region: Secondary | ICD-10-CM | POA: Diagnosis not present

## 2022-01-17 ENCOUNTER — Encounter: Payer: Self-pay | Admitting: Urology

## 2022-01-17 ENCOUNTER — Ambulatory Visit: Payer: PPO | Admitting: Urology

## 2022-01-17 VITALS — BP 124/73 | HR 67 | Ht 71.0 in | Wt 217.0 lb

## 2022-01-17 DIAGNOSIS — R972 Elevated prostate specific antigen [PSA]: Secondary | ICD-10-CM

## 2022-01-17 DIAGNOSIS — N138 Other obstructive and reflux uropathy: Secondary | ICD-10-CM

## 2022-01-17 DIAGNOSIS — N401 Enlarged prostate with lower urinary tract symptoms: Secondary | ICD-10-CM | POA: Diagnosis not present

## 2022-01-17 DIAGNOSIS — N529 Male erectile dysfunction, unspecified: Secondary | ICD-10-CM | POA: Diagnosis not present

## 2022-01-17 NOTE — Progress Notes (Signed)
Assessment: 1. BPH with obstruction/lower urinary tract symptoms   2. Elevated PSA   3. Organic impotence     Plan: Continue tamsulosin 0.4 mg daily. PSA today Return to office in 6 months  Chief Complaint:  Chief Complaint  Patient presents with   Benign Prostatic Hypertrophy    History of Present Illness:  [name] is a 66 y.o. year old male who is seen for further evaluation of elevated PSA and BPH with obstruction.   PSA results: 7/20 2.5 1/23 5.0 2/23 2.8 with 15.7% free 5/23 3.8 No history of UTIs or prostatitis.  No family history of prostate cancer.  He does have lower urinary tract symptoms including frequency, nocturia 1-2 times, and decreased force of stream.  No dysuria or gross hematuria. IPSS = 7.  PVR = 251 ml.  He was started on tamsulosin 0.4 mg daily at his visit in 2/23.  At his visit in 5/23, he continued on tamsulosin.  He did not notice a significant change in his urinary symptoms.  He continued with nocturia 2 times per night.  No dysuria or gross hematuria. IPSS = 8.  PVR = 81 ml.  He reported problems with achieving and maintaining erections for >1-year.  He was unable to achieve a satisfactory erection for intercourse.  No nocturnal or early morning erections.  No decrease in his libido.  He had not tried any therapy.  Risk factors include coronary artery disease, diabetes, hypertension, hyperlipidemia, and beta-blocker. He was given a trial of sildenafil in 5/23.  He returns today for follow-up.  He continues on tamsulosin 0.4 mg daily.  He is doing very well from a urinary symptom standpoint.  He reports nocturia x1.  No dysuria or gross hematuria. IPSS = 1 today. He did not try the sildenafil.  He is not interested in any treatment for his erectile dysfunction at this time.  Portions of the above documentation were copied from a prior visit for review purposes only.   Past Medical History:  Past Medical History:  Diagnosis Date    Arthritis    generalized   Coronary artery disease    stents placed 2003   Hyperlipidemia    Hypertension    Sleep apnea    on CPAP    Past Surgical History:  Past Surgical History:  Procedure Laterality Date   BARIATRIC SURGERY  12/2015   BICEPS TENDON REPAIR     CARDIAC CATHETERIZATION  2000   DRUG INDUCED ENDOSCOPY N/A 08/26/2020   Procedure: DRUG INDUCED ENDOSCOPY;  Surgeon: Osborn Coho, MD;  Location: [facility];  Service: ENT;  Laterality: N/A;   IMPLANTATION OF HYPOGLOSSAL NERVE STIMULATOR Right 01/25/2021   Procedure: IMPLANTATION OF HYPOGLOSSAL NERVE STIMULATOR;  Surgeon: Osborn Coho, MD;  Location: [facility] OR;  Service: ENT;  Laterality: Right;   KNEE ARTHROSCOPY Bilateral    septorhinoplasty     SHOULDER ARTHROSCOPY Bilateral    TONSILLECTOMY     UVULLA REMOVAL      Allergies:  No Known Allergies  Family History:  Family History  Problem Relation Age of Onset   Diabetes Mother    Diabetes Father    Heart attack Father     Social History:  Social History   Tobacco Use   Smoking status: Never   Smokeless tobacco: Never  Vaping Use   Vaping Use: Never used  Substance Use Topics   Alcohol use: Not Currently    Alcohol/week: 0.0 standard drinks of alcohol  Drug use: Never    ROS: Constitutional:  Negative for fever, chills, weight loss CV: Negative for chest pain, previous MI, hypertension Respiratory:  Negative for shortness of breath, wheezing, sleep apnea, frequent cough GI:  Negative for nausea, vomiting, bloody stool, GERD  Physical exam: BP 124/73   Pulse 67   Ht 5\' 11"  (1.803 m)   Wt 217 lb (98.4 kg)   BMI 30.27 kg/m  GENERAL APPEARANCE:  Well appearing, well developed, well nourished, NAD HEENT:  Atraumatic, normocephalic, oropharynx clear NECK:  Supple without lymphadenopathy or thyromegaly ABDOMEN:  Soft, non-tender, no masses EXTREMITIES:  Moves all extremities well, without clubbing, cyanosis, or  edema NEUROLOGIC:  Alert and oriented x 3, normal gait, CN II-XII grossly intact MENTAL STATUS:  appropriate BACK:  Non-tender to palpation, No CVAT SKIN:  Warm, dry, and intact GU: Prostate: 40 g, NT, no nodules Rectum: Normal tone,  no masses or tenderness  Results: U/A: Dipstick: 3+ glucose, trace protein

## 2022-01-18 LAB — URINALYSIS, ROUTINE W REFLEX MICROSCOPIC
Bilirubin, UA: NEGATIVE
Leukocytes,UA: NEGATIVE
Nitrite, UA: NEGATIVE
RBC, UA: NEGATIVE
Specific Gravity, UA: 1.015 (ref 1.005–1.030)
Urobilinogen, Ur: 0.2 mg/dL (ref 0.2–1.0)
pH, UA: 6 (ref 5.0–7.5)

## 2022-01-18 LAB — PSA: Prostate Specific Ag, Serum: 3.6 ng/mL (ref 0.0–4.0)

## 2022-01-22 ENCOUNTER — Telehealth: Payer: Self-pay

## 2022-01-22 NOTE — Telephone Encounter (Signed)
Patient notified of normal PSA per Dr. Pete Glatter

## 2022-01-22 NOTE — Progress Notes (Signed)
Opened in error

## 2022-01-22 NOTE — Telephone Encounter (Signed)
-----   Message from Milderd Meager, MD sent at 01/19/2022  5:15 PM EST ----- Please notify patient that his PSA remains normal.

## 2022-01-25 DIAGNOSIS — M9903 Segmental and somatic dysfunction of lumbar region: Secondary | ICD-10-CM | POA: Diagnosis not present

## 2022-01-25 DIAGNOSIS — S134XXA Sprain of ligaments of cervical spine, initial encounter: Secondary | ICD-10-CM | POA: Diagnosis not present

## 2022-01-25 DIAGNOSIS — M9901 Segmental and somatic dysfunction of cervical region: Secondary | ICD-10-CM | POA: Diagnosis not present

## 2022-01-25 DIAGNOSIS — M47816 Spondylosis without myelopathy or radiculopathy, lumbar region: Secondary | ICD-10-CM | POA: Diagnosis not present

## 2022-01-25 DIAGNOSIS — M9902 Segmental and somatic dysfunction of thoracic region: Secondary | ICD-10-CM | POA: Diagnosis not present

## 2022-01-25 DIAGNOSIS — S233XXA Sprain of ligaments of thoracic spine, initial encounter: Secondary | ICD-10-CM | POA: Diagnosis not present

## 2022-02-08 DIAGNOSIS — M47816 Spondylosis without myelopathy or radiculopathy, lumbar region: Secondary | ICD-10-CM | POA: Diagnosis not present

## 2022-02-08 DIAGNOSIS — S134XXA Sprain of ligaments of cervical spine, initial encounter: Secondary | ICD-10-CM | POA: Diagnosis not present

## 2022-02-08 DIAGNOSIS — S233XXA Sprain of ligaments of thoracic spine, initial encounter: Secondary | ICD-10-CM | POA: Diagnosis not present

## 2022-02-08 DIAGNOSIS — M9903 Segmental and somatic dysfunction of lumbar region: Secondary | ICD-10-CM | POA: Diagnosis not present

## 2022-02-08 DIAGNOSIS — M9901 Segmental and somatic dysfunction of cervical region: Secondary | ICD-10-CM | POA: Diagnosis not present

## 2022-02-08 DIAGNOSIS — M9902 Segmental and somatic dysfunction of thoracic region: Secondary | ICD-10-CM | POA: Diagnosis not present

## 2022-02-21 ENCOUNTER — Other Ambulatory Visit: Payer: Self-pay | Admitting: Urology

## 2022-02-21 DIAGNOSIS — R339 Retention of urine, unspecified: Secondary | ICD-10-CM

## 2022-02-21 DIAGNOSIS — N138 Other obstructive and reflux uropathy: Secondary | ICD-10-CM

## 2022-02-22 DIAGNOSIS — M47816 Spondylosis without myelopathy or radiculopathy, lumbar region: Secondary | ICD-10-CM | POA: Diagnosis not present

## 2022-02-22 DIAGNOSIS — S134XXA Sprain of ligaments of cervical spine, initial encounter: Secondary | ICD-10-CM | POA: Diagnosis not present

## 2022-02-22 DIAGNOSIS — M9902 Segmental and somatic dysfunction of thoracic region: Secondary | ICD-10-CM | POA: Diagnosis not present

## 2022-02-22 DIAGNOSIS — S233XXA Sprain of ligaments of thoracic spine, initial encounter: Secondary | ICD-10-CM | POA: Diagnosis not present

## 2022-02-22 DIAGNOSIS — M9903 Segmental and somatic dysfunction of lumbar region: Secondary | ICD-10-CM | POA: Diagnosis not present

## 2022-02-22 DIAGNOSIS — M9901 Segmental and somatic dysfunction of cervical region: Secondary | ICD-10-CM | POA: Diagnosis not present

## 2022-03-08 DIAGNOSIS — M9901 Segmental and somatic dysfunction of cervical region: Secondary | ICD-10-CM | POA: Diagnosis not present

## 2022-03-08 DIAGNOSIS — M9903 Segmental and somatic dysfunction of lumbar region: Secondary | ICD-10-CM | POA: Diagnosis not present

## 2022-03-08 DIAGNOSIS — M9902 Segmental and somatic dysfunction of thoracic region: Secondary | ICD-10-CM | POA: Diagnosis not present

## 2022-03-08 DIAGNOSIS — M47816 Spondylosis without myelopathy or radiculopathy, lumbar region: Secondary | ICD-10-CM | POA: Diagnosis not present

## 2022-03-08 DIAGNOSIS — S134XXA Sprain of ligaments of cervical spine, initial encounter: Secondary | ICD-10-CM | POA: Diagnosis not present

## 2022-03-08 DIAGNOSIS — S233XXA Sprain of ligaments of thoracic spine, initial encounter: Secondary | ICD-10-CM | POA: Diagnosis not present

## 2022-03-19 ENCOUNTER — Ambulatory Visit: Payer: PPO | Attending: Cardiology | Admitting: Cardiology

## 2022-03-19 ENCOUNTER — Encounter: Payer: Self-pay | Admitting: Cardiology

## 2022-03-19 VITALS — BP 120/74 | HR 68 | Ht 71.0 in | Wt 225.6 lb

## 2022-03-19 DIAGNOSIS — E119 Type 2 diabetes mellitus without complications: Secondary | ICD-10-CM

## 2022-03-19 DIAGNOSIS — I10 Essential (primary) hypertension: Secondary | ICD-10-CM | POA: Diagnosis not present

## 2022-03-19 DIAGNOSIS — I2510 Atherosclerotic heart disease of native coronary artery without angina pectoris: Secondary | ICD-10-CM

## 2022-03-19 DIAGNOSIS — E782 Mixed hyperlipidemia: Secondary | ICD-10-CM | POA: Diagnosis not present

## 2022-03-19 NOTE — Patient Instructions (Signed)

## 2022-03-19 NOTE — Progress Notes (Signed)
Clinical Summary [name] is a 67 y.o.male seen today for follow up of the following medical problems.    1. CAD - history of prior stent to LAD, with repeat balloon angioplsty of LAD stent in 2005 - last cath 2008 with patent stent and vessels.  - 07/2015 echo LVEF 60-65% and now WMAs. 08/2015 nuclear stress test without ischemia.         - no chest pains, no SOB/DOE - elliptical machine twice a week x 35 minutes, no exertinal symptoms - compliant with meds     2. Hyperlipidemia - on crestor, vascepa     09/2020 TC 87 TG 117 HDL 37 LDL 29 10/2021 LDL 40 TG 161 HDL 37     3. HTN -compliant with meds       4. DM2 - followed by endocrinolgist Dr Legrand Como Atl[place]   -HgbA1c has been at goal      5. History of gastric bypass      SH: retired in 2010, former [employer]. 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 [employer] fan      Past Medical History:  Diagnosis Date   Arthritis    generalized   Coronary artery disease    stents placed 2003   Hyperlipidemia    Hypertension    Sleep apnea    on CPAP     No Known Allergies   Current Outpatient Medications  Medication Sig Dispense Refill   aspirin 81 MG tablet Take 81 mg by mouth daily.     b complex vitamins tablet Take 1 tablet by mouth daily.     Calcium 500-100 MG-UNIT CHEW Chew 1 tablet by mouth 4 (four) times daily.     Cholecalciferol (VITAMIN D) 50 MCG (2000 UT) tablet Take 6,000 Units by mouth daily.     empagliflozin (JARDIANCE) 25 MG TABS tablet Take 12.5 mg by mouth daily.     ferrous sulfate 325 (65 FE) MG EC tablet Take 325 mg by mouth daily.     icosapent Ethyl (VASCEPA) 1 g capsule Take 1 g by mouth 3 (three) times daily.     Magnesium 250 MG TABS Take 250 mg by mouth daily.     metFORMIN (GLUCOPHAGE) 850 MG tablet Take 850 mg by mouth 2 (two) times daily with a meal.     metoprolol succinate (TOPROL-XL) 25 MG 24 hr tablet TAKE 1 TABLET BY MOUTH  DAILY 90 tablet 3    Multiple Vitamin (MULTIVITAMIN) tablet Take 1 tablet by mouth 2 (two) times daily.     Polyethyl Glycol-Propyl Glycol (SYSTANE OP) Place 1 drop into both eyes daily as needed (dry eyes).     potassium chloride SA (K-DUR,KLOR-CON) 20 MEQ tablet Take 20 mEq by mouth 2 (two) times daily.      ramipril (ALTACE) 2.5 MG capsule TAKE 1 CAPSULE BY MOUTH  DAILY 90 capsule 3   rosuvastatin (CRESTOR) 20 MG tablet TAKE 1 TABLET(20 MG) BY MOUTH DAILY 90 tablet 2   tamsulosin (FLOMAX) 0.4 MG CAPS capsule Take 1 capsule (0.4 mg total) by mouth daily. 30 capsule 11   vitamin B-12 (CYANOCOBALAMIN) 1000 MCG tablet Take 1,000 mcg by mouth daily.     No current facility-administered medications for this visit.     Past Surgical History:  Procedure Laterality Date   BARIATRIC SURGERY  12/2015   BICEPS TENDON REPAIR     CARDIAC CATHETERIZATION  2000   DRUG INDUCED ENDOSCOPY N/A 08/26/2020  Procedure: DRUG INDUCED ENDOSCOPY;  Surgeon: Osborn Coho, MD;  Location: [facility];  Service: ENT;  Laterality: N/A;   IMPLANTATION OF HYPOGLOSSAL NERVE STIMULATOR Right 01/25/2021   Procedure: IMPLANTATION OF HYPOGLOSSAL NERVE STIMULATOR;  Surgeon: Osborn Coho, MD;  Location: [facility] OR;  Service: ENT;  Laterality: Right;   KNEE ARTHROSCOPY Bilateral    septorhinoplasty     SHOULDER ARTHROSCOPY Bilateral    TONSILLECTOMY     UVULLA REMOVAL       No Known Allergies    Family History  Problem Relation Age of Onset   Diabetes Mother    Diabetes Father    Heart attack Father      Social History [name] reports that he has never smoked. He has never used smokeless tobacco. [name] reports that he does not currently use alcohol.   Review of Systems CONSTITUTIONAL: No weight loss, fever, chills, weakness or fatigue.  HEENT: Eyes: No visual loss, blurred vision, double vision or yellow sclerae.No hearing loss, sneezing, congestion, runny nose or sore throat.  SKIN: No rash or  itching.  CARDIOVASCULAR: per hpi RESPIRATORY: No shortness of breath, cough or sputum.  GASTROINTESTINAL: No anorexia, nausea, vomiting or diarrhea. No abdominal pain or blood.  GENITOURINARY: No burning on urination, no polyuria NEUROLOGICAL: No headache, dizziness, syncope, paralysis, ataxia, numbness or tingling in the extremities. No change in bowel or bladder control.  MUSCULOSKELETAL: No muscle, back pain, joint pain or stiffness.  LYMPHATICS: No enlarged nodes. No history of splenectomy.  PSYCHIATRIC: No history of depression or anxiety.  ENDOCRINOLOGIC: No reports of sweating, cold or heat intolerance. No polyuria or polydipsia.  Marland Kitchen   Physical Examination Today's Vitals   03/19/22 1133  BP: 120/74  Pulse: 68  SpO2: 98%  Weight: 225 lb 9.6 oz (102.3 kg)  Height: 5\' 11"  (1.803 m)   Body mass index is 31.46 kg/m.  Gen: resting comfortably, no acute distress HEENT: no scleral icterus, pupils equal round and reactive, no palptable cervical adenopathy,  CV: RRR, no mrg, no jvd Resp: Clear to auscultation bilaterally GI: abdomen is soft, non-tender, non-distended, normal bowel sounds, no hepatosplenomegaly MSK: extremities are warm, no edema.  Skin: warm, no rash Neuro:  no focal deficits Psych: appropriate affect   Diagnostic Studies  02/2007 cath CONCLUSION:  1. Preserved left ventricular function with ejection fraction in      excess of 60%.  2. Continued patency of the stent site at the site of subsequent      cutting balloon angioplasty.  3. Scattered coronary irregularities as noted above.  4. Probable poor control of diabetes mellitus.    RECOMMENDATIONS:  We will likely recommend medical therapy.  We will  keep him overnight and recheck his enzymes.     07/2015 echo Study Conclusions   - Left ventricle: The cavity size was normal. Wall thickness was   increased in a pattern of moderate LVH. Systolic function was   normal. The estimated ejection fraction  was in the range of 60%   to 65%. Wall motion was normal; there were no regional wall   motion abnormalities. Left ventricular diastolic function   parameters were normal. - Aortic valve: Valve area (VTI): 4.3 cm^2. Valve area (Vmax): 4.12   cm^2. Valve area (Vmean): 3.19 cm^2. - Technically adequate study.   08/2015 Nuclear stress test Blood pressure demonstrated a hypertensive response to exercise. There was no ST segment deviation noted during stress. Defect 1: There is a small defect of  mild severity present in the mid inferior, mid inferolateral and apical inferior location. This is consistent with soft tissue attenuation artifact. No areas of ischemia or infarction. This is a low risk study. Nuclear stress EF: 68%.   Assessment and Plan   1. CAD -no symptoms, continue current meds - EKG shows SR with isolated PAC, no ischemic changes   2. Hyperlipidemia - LDL at goal, continue current meds     3. HTN - bp at goal, continue current meds     4. DM2 - glycemic management per pcp, has been at goal -from cardiac standpoint he will continhe his statin, acei   F/u 1 year  Antoine Poche, M.D.

## 2022-03-22 DIAGNOSIS — S134XXA Sprain of ligaments of cervical spine, initial encounter: Secondary | ICD-10-CM | POA: Diagnosis not present

## 2022-03-22 DIAGNOSIS — M9903 Segmental and somatic dysfunction of lumbar region: Secondary | ICD-10-CM | POA: Diagnosis not present

## 2022-03-22 DIAGNOSIS — M9902 Segmental and somatic dysfunction of thoracic region: Secondary | ICD-10-CM | POA: Diagnosis not present

## 2022-03-22 DIAGNOSIS — S233XXA Sprain of ligaments of thoracic spine, initial encounter: Secondary | ICD-10-CM | POA: Diagnosis not present

## 2022-03-22 DIAGNOSIS — M47816 Spondylosis without myelopathy or radiculopathy, lumbar region: Secondary | ICD-10-CM | POA: Diagnosis not present

## 2022-03-22 DIAGNOSIS — M9901 Segmental and somatic dysfunction of cervical region: Secondary | ICD-10-CM | POA: Diagnosis not present

## 2022-03-23 DIAGNOSIS — E1169 Type 2 diabetes mellitus with other specified complication: Secondary | ICD-10-CM | POA: Diagnosis not present

## 2022-03-23 DIAGNOSIS — E1122 Type 2 diabetes mellitus with diabetic chronic kidney disease: Secondary | ICD-10-CM | POA: Diagnosis not present

## 2022-03-23 DIAGNOSIS — Z7984 Long term (current) use of oral hypoglycemic drugs: Secondary | ICD-10-CM | POA: Diagnosis not present

## 2022-03-23 DIAGNOSIS — G9339 Other post infection and related fatigue syndromes: Secondary | ICD-10-CM | POA: Diagnosis not present

## 2022-03-23 DIAGNOSIS — E785 Hyperlipidemia, unspecified: Secondary | ICD-10-CM | POA: Diagnosis not present

## 2022-03-23 DIAGNOSIS — I129 Hypertensive chronic kidney disease with stage 1 through stage 4 chronic kidney disease, or unspecified chronic kidney disease: Secondary | ICD-10-CM | POA: Diagnosis not present

## 2022-03-23 DIAGNOSIS — I2510 Atherosclerotic heart disease of native coronary artery without angina pectoris: Secondary | ICD-10-CM | POA: Diagnosis not present

## 2022-03-23 DIAGNOSIS — R809 Proteinuria, unspecified: Secondary | ICD-10-CM | POA: Diagnosis not present

## 2022-03-23 DIAGNOSIS — N181 Chronic kidney disease, stage 1: Secondary | ICD-10-CM | POA: Diagnosis not present

## 2022-03-23 DIAGNOSIS — Z9884 Bariatric surgery status: Secondary | ICD-10-CM | POA: Diagnosis not present

## 2022-03-23 DIAGNOSIS — D508 Other iron deficiency anemias: Secondary | ICD-10-CM | POA: Diagnosis not present

## 2022-03-23 DIAGNOSIS — E1159 Type 2 diabetes mellitus with other circulatory complications: Secondary | ICD-10-CM | POA: Diagnosis not present

## 2022-04-02 DIAGNOSIS — R972 Elevated prostate specific antigen [PSA]: Secondary | ICD-10-CM | POA: Diagnosis not present

## 2022-04-02 DIAGNOSIS — E7849 Other hyperlipidemia: Secondary | ICD-10-CM | POA: Diagnosis not present

## 2022-04-02 DIAGNOSIS — E1143 Type 2 diabetes mellitus with diabetic autonomic (poly)neuropathy: Secondary | ICD-10-CM | POA: Diagnosis not present

## 2022-04-02 DIAGNOSIS — Z9884 Bariatric surgery status: Secondary | ICD-10-CM | POA: Diagnosis not present

## 2022-04-02 DIAGNOSIS — Z1389 Encounter for screening for other disorder: Secondary | ICD-10-CM | POA: Diagnosis not present

## 2022-04-02 DIAGNOSIS — K760 Fatty (change of) liver, not elsewhere classified: Secondary | ICD-10-CM | POA: Diagnosis not present

## 2022-04-02 DIAGNOSIS — R030 Elevated blood-pressure reading, without diagnosis of hypertension: Secondary | ICD-10-CM | POA: Diagnosis not present

## 2022-04-02 DIAGNOSIS — Z0001 Encounter for general adult medical examination with abnormal findings: Secondary | ICD-10-CM | POA: Diagnosis not present

## 2022-04-02 DIAGNOSIS — I2510 Atherosclerotic heart disease of native coronary artery without angina pectoris: Secondary | ICD-10-CM | POA: Diagnosis not present

## 2022-04-02 DIAGNOSIS — Z1331 Encounter for screening for depression: Secondary | ICD-10-CM | POA: Diagnosis not present

## 2022-04-02 DIAGNOSIS — N401 Enlarged prostate with lower urinary tract symptoms: Secondary | ICD-10-CM | POA: Diagnosis not present

## 2022-04-02 DIAGNOSIS — N138 Other obstructive and reflux uropathy: Secondary | ICD-10-CM | POA: Diagnosis not present

## 2022-04-05 DIAGNOSIS — M9902 Segmental and somatic dysfunction of thoracic region: Secondary | ICD-10-CM | POA: Diagnosis not present

## 2022-04-05 DIAGNOSIS — S134XXA Sprain of ligaments of cervical spine, initial encounter: Secondary | ICD-10-CM | POA: Diagnosis not present

## 2022-04-05 DIAGNOSIS — S233XXA Sprain of ligaments of thoracic spine, initial encounter: Secondary | ICD-10-CM | POA: Diagnosis not present

## 2022-04-05 DIAGNOSIS — M47816 Spondylosis without myelopathy or radiculopathy, lumbar region: Secondary | ICD-10-CM | POA: Diagnosis not present

## 2022-04-05 DIAGNOSIS — M9903 Segmental and somatic dysfunction of lumbar region: Secondary | ICD-10-CM | POA: Diagnosis not present

## 2022-04-05 DIAGNOSIS — M9901 Segmental and somatic dysfunction of cervical region: Secondary | ICD-10-CM | POA: Diagnosis not present

## 2022-04-09 ENCOUNTER — Other Ambulatory Visit: Payer: Self-pay

## 2022-04-09 ENCOUNTER — Telehealth: Payer: Self-pay

## 2022-04-09 DIAGNOSIS — R339 Retention of urine, unspecified: Secondary | ICD-10-CM

## 2022-04-09 DIAGNOSIS — N138 Other obstructive and reflux uropathy: Secondary | ICD-10-CM

## 2022-04-09 MED ORDER — TAMSULOSIN HCL 0.4 MG PO CAPS: ORAL_CAPSULE | ORAL | 11 refills | Status: DC

## 2022-04-09 NOTE — Telephone Encounter (Signed)
Rx sent to pharmacy   

## 2022-04-09 NOTE — Progress Notes (Signed)
Pt requested flomax be sent to [facility].  Rx sent.

## 2022-04-09 NOTE — Telephone Encounter (Signed)
Former Curator patient. Needing a refill on:  tamsulosin (FLOMAX) 0.4 MG CAPS capsule   New Pharmacy:  [facility] [address], [place] Phone: [phone]  Fax: [phone]

## 2022-04-19 DIAGNOSIS — S134XXA Sprain of ligaments of cervical spine, initial encounter: Secondary | ICD-10-CM | POA: Diagnosis not present

## 2022-04-19 DIAGNOSIS — M9903 Segmental and somatic dysfunction of lumbar region: Secondary | ICD-10-CM | POA: Diagnosis not present

## 2022-04-19 DIAGNOSIS — M9902 Segmental and somatic dysfunction of thoracic region: Secondary | ICD-10-CM | POA: Diagnosis not present

## 2022-04-19 DIAGNOSIS — M9901 Segmental and somatic dysfunction of cervical region: Secondary | ICD-10-CM | POA: Diagnosis not present

## 2022-04-19 DIAGNOSIS — M47816 Spondylosis without myelopathy or radiculopathy, lumbar region: Secondary | ICD-10-CM | POA: Diagnosis not present

## 2022-04-19 DIAGNOSIS — S233XXA Sprain of ligaments of thoracic spine, initial encounter: Secondary | ICD-10-CM | POA: Diagnosis not present

## 2022-05-03 DIAGNOSIS — S134XXA Sprain of ligaments of cervical spine, initial encounter: Secondary | ICD-10-CM | POA: Diagnosis not present

## 2022-05-03 DIAGNOSIS — M9902 Segmental and somatic dysfunction of thoracic region: Secondary | ICD-10-CM | POA: Diagnosis not present

## 2022-05-03 DIAGNOSIS — M9901 Segmental and somatic dysfunction of cervical region: Secondary | ICD-10-CM | POA: Diagnosis not present

## 2022-05-03 DIAGNOSIS — S233XXA Sprain of ligaments of thoracic spine, initial encounter: Secondary | ICD-10-CM | POA: Diagnosis not present

## 2022-05-03 DIAGNOSIS — M9903 Segmental and somatic dysfunction of lumbar region: Secondary | ICD-10-CM | POA: Diagnosis not present

## 2022-05-03 DIAGNOSIS — M47816 Spondylosis without myelopathy or radiculopathy, lumbar region: Secondary | ICD-10-CM | POA: Diagnosis not present

## 2022-05-16 ENCOUNTER — Encounter (INDEPENDENT_AMBULATORY_CARE_PROVIDER_SITE_OTHER): Payer: PPO | Admitting: Ophthalmology

## 2022-05-17 DIAGNOSIS — M9903 Segmental and somatic dysfunction of lumbar region: Secondary | ICD-10-CM | POA: Diagnosis not present

## 2022-05-17 DIAGNOSIS — S233XXA Sprain of ligaments of thoracic spine, initial encounter: Secondary | ICD-10-CM | POA: Diagnosis not present

## 2022-05-17 DIAGNOSIS — S134XXA Sprain of ligaments of cervical spine, initial encounter: Secondary | ICD-10-CM | POA: Diagnosis not present

## 2022-05-17 DIAGNOSIS — M9901 Segmental and somatic dysfunction of cervical region: Secondary | ICD-10-CM | POA: Diagnosis not present

## 2022-05-17 DIAGNOSIS — M9902 Segmental and somatic dysfunction of thoracic region: Secondary | ICD-10-CM | POA: Diagnosis not present

## 2022-05-17 DIAGNOSIS — M47816 Spondylosis without myelopathy or radiculopathy, lumbar region: Secondary | ICD-10-CM | POA: Diagnosis not present

## 2022-05-21 DIAGNOSIS — Z4542 Encounter for adjustment and management of neuropacemaker (brain) (peripheral nerve) (spinal cord): Secondary | ICD-10-CM | POA: Diagnosis not present

## 2022-05-21 DIAGNOSIS — G4733 Obstructive sleep apnea (adult) (pediatric): Secondary | ICD-10-CM | POA: Diagnosis not present

## 2022-05-22 ENCOUNTER — Encounter (INDEPENDENT_AMBULATORY_CARE_PROVIDER_SITE_OTHER): Payer: PPO | Admitting: Ophthalmology

## 2022-05-22 DIAGNOSIS — E113411 Type 2 diabetes mellitus with severe nonproliferative diabetic retinopathy with macular edema, right eye: Secondary | ICD-10-CM | POA: Diagnosis not present

## 2022-05-22 DIAGNOSIS — E113412 Type 2 diabetes mellitus with severe nonproliferative diabetic retinopathy with macular edema, left eye: Secondary | ICD-10-CM | POA: Diagnosis not present

## 2022-05-31 DIAGNOSIS — S134XXA Sprain of ligaments of cervical spine, initial encounter: Secondary | ICD-10-CM | POA: Diagnosis not present

## 2022-05-31 DIAGNOSIS — M9903 Segmental and somatic dysfunction of lumbar region: Secondary | ICD-10-CM | POA: Diagnosis not present

## 2022-05-31 DIAGNOSIS — M9901 Segmental and somatic dysfunction of cervical region: Secondary | ICD-10-CM | POA: Diagnosis not present

## 2022-05-31 DIAGNOSIS — M47816 Spondylosis without myelopathy or radiculopathy, lumbar region: Secondary | ICD-10-CM | POA: Diagnosis not present

## 2022-05-31 DIAGNOSIS — S233XXA Sprain of ligaments of thoracic spine, initial encounter: Secondary | ICD-10-CM | POA: Diagnosis not present

## 2022-05-31 DIAGNOSIS — M9902 Segmental and somatic dysfunction of thoracic region: Secondary | ICD-10-CM | POA: Diagnosis not present

## 2022-06-14 DIAGNOSIS — M9901 Segmental and somatic dysfunction of cervical region: Secondary | ICD-10-CM | POA: Diagnosis not present

## 2022-06-14 DIAGNOSIS — S134XXA Sprain of ligaments of cervical spine, initial encounter: Secondary | ICD-10-CM | POA: Diagnosis not present

## 2022-06-14 DIAGNOSIS — M9902 Segmental and somatic dysfunction of thoracic region: Secondary | ICD-10-CM | POA: Diagnosis not present

## 2022-06-14 DIAGNOSIS — M9903 Segmental and somatic dysfunction of lumbar region: Secondary | ICD-10-CM | POA: Diagnosis not present

## 2022-06-14 DIAGNOSIS — M47816 Spondylosis without myelopathy or radiculopathy, lumbar region: Secondary | ICD-10-CM | POA: Diagnosis not present

## 2022-06-14 DIAGNOSIS — S233XXA Sprain of ligaments of thoracic spine, initial encounter: Secondary | ICD-10-CM | POA: Diagnosis not present

## 2022-06-25 DIAGNOSIS — I129 Hypertensive chronic kidney disease with stage 1 through stage 4 chronic kidney disease, or unspecified chronic kidney disease: Secondary | ICD-10-CM | POA: Diagnosis not present

## 2022-06-25 DIAGNOSIS — E1140 Type 2 diabetes mellitus with diabetic neuropathy, unspecified: Secondary | ICD-10-CM | POA: Diagnosis not present

## 2022-06-25 DIAGNOSIS — E118 Type 2 diabetes mellitus with unspecified complications: Secondary | ICD-10-CM | POA: Diagnosis not present

## 2022-06-25 DIAGNOSIS — E1169 Type 2 diabetes mellitus with other specified complication: Secondary | ICD-10-CM | POA: Diagnosis not present

## 2022-06-25 DIAGNOSIS — E876 Hypokalemia: Secondary | ICD-10-CM | POA: Diagnosis not present

## 2022-06-25 DIAGNOSIS — N181 Chronic kidney disease, stage 1: Secondary | ICD-10-CM | POA: Diagnosis not present

## 2022-06-25 DIAGNOSIS — E785 Hyperlipidemia, unspecified: Secondary | ICD-10-CM | POA: Diagnosis not present

## 2022-06-25 DIAGNOSIS — K760 Fatty (change of) liver, not elsewhere classified: Secondary | ICD-10-CM | POA: Diagnosis not present

## 2022-06-25 DIAGNOSIS — E113493 Type 2 diabetes mellitus with severe nonproliferative diabetic retinopathy without macular edema, bilateral: Secondary | ICD-10-CM | POA: Diagnosis not present

## 2022-06-25 DIAGNOSIS — E559 Vitamin D deficiency, unspecified: Secondary | ICD-10-CM | POA: Diagnosis not present

## 2022-06-25 DIAGNOSIS — E1122 Type 2 diabetes mellitus with diabetic chronic kidney disease: Secondary | ICD-10-CM | POA: Diagnosis not present

## 2022-06-25 DIAGNOSIS — R809 Proteinuria, unspecified: Secondary | ICD-10-CM | POA: Diagnosis not present

## 2022-06-25 DIAGNOSIS — D508 Other iron deficiency anemias: Secondary | ICD-10-CM | POA: Diagnosis not present

## 2022-06-25 DIAGNOSIS — G9339 Other post infection and related fatigue syndromes: Secondary | ICD-10-CM | POA: Diagnosis not present

## 2022-06-25 DIAGNOSIS — Z9884 Bariatric surgery status: Secondary | ICD-10-CM | POA: Diagnosis not present

## 2022-06-25 DIAGNOSIS — E1129 Type 2 diabetes mellitus with other diabetic kidney complication: Secondary | ICD-10-CM | POA: Diagnosis not present

## 2022-06-25 DIAGNOSIS — E538 Deficiency of other specified B group vitamins: Secondary | ICD-10-CM | POA: Diagnosis not present

## 2022-06-25 DIAGNOSIS — E1159 Type 2 diabetes mellitus with other circulatory complications: Secondary | ICD-10-CM | POA: Diagnosis not present

## 2022-06-27 DIAGNOSIS — E113411 Type 2 diabetes mellitus with severe nonproliferative diabetic retinopathy with macular edema, right eye: Secondary | ICD-10-CM | POA: Diagnosis not present

## 2022-06-27 DIAGNOSIS — E113412 Type 2 diabetes mellitus with severe nonproliferative diabetic retinopathy with macular edema, left eye: Secondary | ICD-10-CM | POA: Diagnosis not present

## 2022-06-28 DIAGNOSIS — M9901 Segmental and somatic dysfunction of cervical region: Secondary | ICD-10-CM | POA: Diagnosis not present

## 2022-06-28 DIAGNOSIS — S233XXA Sprain of ligaments of thoracic spine, initial encounter: Secondary | ICD-10-CM | POA: Diagnosis not present

## 2022-06-28 DIAGNOSIS — M47816 Spondylosis without myelopathy or radiculopathy, lumbar region: Secondary | ICD-10-CM | POA: Diagnosis not present

## 2022-06-28 DIAGNOSIS — S134XXA Sprain of ligaments of cervical spine, initial encounter: Secondary | ICD-10-CM | POA: Diagnosis not present

## 2022-06-28 DIAGNOSIS — M9903 Segmental and somatic dysfunction of lumbar region: Secondary | ICD-10-CM | POA: Diagnosis not present

## 2022-06-28 DIAGNOSIS — M9902 Segmental and somatic dysfunction of thoracic region: Secondary | ICD-10-CM | POA: Diagnosis not present

## 2022-06-29 DIAGNOSIS — K760 Fatty (change of) liver, not elsewhere classified: Secondary | ICD-10-CM | POA: Diagnosis not present

## 2022-06-29 DIAGNOSIS — E1129 Type 2 diabetes mellitus with other diabetic kidney complication: Secondary | ICD-10-CM | POA: Diagnosis not present

## 2022-06-29 DIAGNOSIS — E1159 Type 2 diabetes mellitus with other circulatory complications: Secondary | ICD-10-CM | POA: Diagnosis not present

## 2022-06-29 DIAGNOSIS — E113313 Type 2 diabetes mellitus with moderate nonproliferative diabetic retinopathy with macular edema, bilateral: Secondary | ICD-10-CM | POA: Diagnosis not present

## 2022-06-29 DIAGNOSIS — E1169 Type 2 diabetes mellitus with other specified complication: Secondary | ICD-10-CM | POA: Diagnosis not present

## 2022-06-29 DIAGNOSIS — E538 Deficiency of other specified B group vitamins: Secondary | ICD-10-CM | POA: Diagnosis not present

## 2022-06-29 DIAGNOSIS — D508 Other iron deficiency anemias: Secondary | ICD-10-CM | POA: Diagnosis not present

## 2022-06-29 DIAGNOSIS — E1122 Type 2 diabetes mellitus with diabetic chronic kidney disease: Secondary | ICD-10-CM | POA: Diagnosis not present

## 2022-06-29 DIAGNOSIS — E559 Vitamin D deficiency, unspecified: Secondary | ICD-10-CM | POA: Diagnosis not present

## 2022-06-29 DIAGNOSIS — E1142 Type 2 diabetes mellitus with diabetic polyneuropathy: Secondary | ICD-10-CM | POA: Diagnosis not present

## 2022-06-29 DIAGNOSIS — I2510 Atherosclerotic heart disease of native coronary artery without angina pectoris: Secondary | ICD-10-CM | POA: Diagnosis not present

## 2022-06-29 DIAGNOSIS — Z9884 Bariatric surgery status: Secondary | ICD-10-CM | POA: Diagnosis not present

## 2022-07-04 DIAGNOSIS — E113411 Type 2 diabetes mellitus with severe nonproliferative diabetic retinopathy with macular edema, right eye: Secondary | ICD-10-CM | POA: Diagnosis not present

## 2022-07-04 DIAGNOSIS — E113412 Type 2 diabetes mellitus with severe nonproliferative diabetic retinopathy with macular edema, left eye: Secondary | ICD-10-CM | POA: Diagnosis not present

## 2022-07-12 DIAGNOSIS — M9903 Segmental and somatic dysfunction of lumbar region: Secondary | ICD-10-CM | POA: Diagnosis not present

## 2022-07-12 DIAGNOSIS — S134XXA Sprain of ligaments of cervical spine, initial encounter: Secondary | ICD-10-CM | POA: Diagnosis not present

## 2022-07-12 DIAGNOSIS — M9902 Segmental and somatic dysfunction of thoracic region: Secondary | ICD-10-CM | POA: Diagnosis not present

## 2022-07-12 DIAGNOSIS — S233XXA Sprain of ligaments of thoracic spine, initial encounter: Secondary | ICD-10-CM | POA: Diagnosis not present

## 2022-07-12 DIAGNOSIS — M9901 Segmental and somatic dysfunction of cervical region: Secondary | ICD-10-CM | POA: Diagnosis not present

## 2022-07-12 DIAGNOSIS — M47816 Spondylosis without myelopathy or radiculopathy, lumbar region: Secondary | ICD-10-CM | POA: Diagnosis not present

## 2022-07-20 ENCOUNTER — Ambulatory Visit: Payer: PPO | Admitting: Urology

## 2022-07-26 DIAGNOSIS — M9903 Segmental and somatic dysfunction of lumbar region: Secondary | ICD-10-CM | POA: Diagnosis not present

## 2022-07-26 DIAGNOSIS — M47816 Spondylosis without myelopathy or radiculopathy, lumbar region: Secondary | ICD-10-CM | POA: Diagnosis not present

## 2022-07-26 DIAGNOSIS — M9901 Segmental and somatic dysfunction of cervical region: Secondary | ICD-10-CM | POA: Diagnosis not present

## 2022-07-26 DIAGNOSIS — S233XXA Sprain of ligaments of thoracic spine, initial encounter: Secondary | ICD-10-CM | POA: Diagnosis not present

## 2022-07-26 DIAGNOSIS — M9902 Segmental and somatic dysfunction of thoracic region: Secondary | ICD-10-CM | POA: Diagnosis not present

## 2022-07-26 DIAGNOSIS — S134XXA Sprain of ligaments of cervical spine, initial encounter: Secondary | ICD-10-CM | POA: Diagnosis not present

## 2022-08-08 DIAGNOSIS — E113412 Type 2 diabetes mellitus with severe nonproliferative diabetic retinopathy with macular edema, left eye: Secondary | ICD-10-CM | POA: Diagnosis not present

## 2022-08-08 DIAGNOSIS — E113411 Type 2 diabetes mellitus with severe nonproliferative diabetic retinopathy with macular edema, right eye: Secondary | ICD-10-CM | POA: Diagnosis not present

## 2022-08-09 DIAGNOSIS — M9903 Segmental and somatic dysfunction of lumbar region: Secondary | ICD-10-CM | POA: Diagnosis not present

## 2022-08-09 DIAGNOSIS — M9902 Segmental and somatic dysfunction of thoracic region: Secondary | ICD-10-CM | POA: Diagnosis not present

## 2022-08-09 DIAGNOSIS — S134XXA Sprain of ligaments of cervical spine, initial encounter: Secondary | ICD-10-CM | POA: Diagnosis not present

## 2022-08-09 DIAGNOSIS — M47816 Spondylosis without myelopathy or radiculopathy, lumbar region: Secondary | ICD-10-CM | POA: Diagnosis not present

## 2022-08-09 DIAGNOSIS — S233XXA Sprain of ligaments of thoracic spine, initial encounter: Secondary | ICD-10-CM | POA: Diagnosis not present

## 2022-08-09 DIAGNOSIS — M9901 Segmental and somatic dysfunction of cervical region: Secondary | ICD-10-CM | POA: Diagnosis not present

## 2022-08-15 DIAGNOSIS — E113411 Type 2 diabetes mellitus with severe nonproliferative diabetic retinopathy with macular edema, right eye: Secondary | ICD-10-CM | POA: Diagnosis not present

## 2022-08-15 DIAGNOSIS — E113412 Type 2 diabetes mellitus with severe nonproliferative diabetic retinopathy with macular edema, left eye: Secondary | ICD-10-CM | POA: Diagnosis not present

## 2022-08-21 ENCOUNTER — Ambulatory Visit: Payer: PPO | Admitting: Urology

## 2022-08-23 DIAGNOSIS — S134XXA Sprain of ligaments of cervical spine, initial encounter: Secondary | ICD-10-CM | POA: Diagnosis not present

## 2022-08-23 DIAGNOSIS — S233XXA Sprain of ligaments of thoracic spine, initial encounter: Secondary | ICD-10-CM | POA: Diagnosis not present

## 2022-08-23 DIAGNOSIS — M9902 Segmental and somatic dysfunction of thoracic region: Secondary | ICD-10-CM | POA: Diagnosis not present

## 2022-08-23 DIAGNOSIS — M9903 Segmental and somatic dysfunction of lumbar region: Secondary | ICD-10-CM | POA: Diagnosis not present

## 2022-08-23 DIAGNOSIS — M9901 Segmental and somatic dysfunction of cervical region: Secondary | ICD-10-CM | POA: Diagnosis not present

## 2022-08-23 DIAGNOSIS — M47816 Spondylosis without myelopathy or radiculopathy, lumbar region: Secondary | ICD-10-CM | POA: Diagnosis not present

## 2022-09-06 DIAGNOSIS — S134XXA Sprain of ligaments of cervical spine, initial encounter: Secondary | ICD-10-CM | POA: Diagnosis not present

## 2022-09-06 DIAGNOSIS — M9903 Segmental and somatic dysfunction of lumbar region: Secondary | ICD-10-CM | POA: Diagnosis not present

## 2022-09-06 DIAGNOSIS — S233XXA Sprain of ligaments of thoracic spine, initial encounter: Secondary | ICD-10-CM | POA: Diagnosis not present

## 2022-09-06 DIAGNOSIS — M9901 Segmental and somatic dysfunction of cervical region: Secondary | ICD-10-CM | POA: Diagnosis not present

## 2022-09-06 DIAGNOSIS — M9902 Segmental and somatic dysfunction of thoracic region: Secondary | ICD-10-CM | POA: Diagnosis not present

## 2022-09-06 DIAGNOSIS — M47816 Spondylosis without myelopathy or radiculopathy, lumbar region: Secondary | ICD-10-CM | POA: Diagnosis not present

## 2022-09-12 ENCOUNTER — Ambulatory Visit: Payer: PPO | Admitting: Urology

## 2022-09-12 ENCOUNTER — Encounter: Payer: Self-pay | Admitting: Urology

## 2022-09-12 VITALS — BP 117/76 | HR 94

## 2022-09-12 DIAGNOSIS — R339 Retention of urine, unspecified: Secondary | ICD-10-CM | POA: Diagnosis not present

## 2022-09-12 DIAGNOSIS — N401 Enlarged prostate with lower urinary tract symptoms: Secondary | ICD-10-CM | POA: Diagnosis not present

## 2022-09-12 DIAGNOSIS — N138 Other obstructive and reflux uropathy: Secondary | ICD-10-CM

## 2022-09-12 DIAGNOSIS — R972 Elevated prostate specific antigen [PSA]: Secondary | ICD-10-CM

## 2022-09-12 LAB — URINALYSIS, ROUTINE W REFLEX MICROSCOPIC
Bilirubin, UA: NEGATIVE
Ketones, UA: NEGATIVE
Leukocytes,UA: NEGATIVE
Nitrite, UA: NEGATIVE
Protein,UA: NEGATIVE
RBC, UA: NEGATIVE
Specific Gravity, UA: 1.01 (ref 1.005–1.030)
Urobilinogen, Ur: 0.2 mg/dL (ref 0.2–1.0)
pH, UA: 6 (ref 5.0–7.5)

## 2022-09-12 MED ORDER — ALFUZOSIN HCL ER 10 MG PO TB24: 10.0000 mg | ORAL_TABLET | Freq: Every day | ORAL | 11 refills | Status: DC

## 2022-09-12 NOTE — Progress Notes (Signed)
09/12/2022 1:31 PM   PENNY MCCADDEN [birth year] [id number]  Referring provider: Juliette Alcide, MD [address] [place],  [place] [address]  Followup BPH and elevated PSA   HPI: Mr Cherry is a 67yo here for followup for BPH and incomplete emptying and elevated PSA. PSA 3.6 in 01/2022. IPSS 7 QOL 2 on flomax 0.4mg . He has not noticed significant improvement in his LUTS on flomax 0.4mg  daily. He has nocturia 3x.  He has a hx of OSA and had the aspire implant.   PMH: Past Medical History:  Diagnosis Date   Arthritis    generalized   Coronary artery disease    stents placed 2003   Hyperlipidemia    Hypertension    Sleep apnea    on CPAP    Surgical History: Past Surgical History:  Procedure Laterality Date   BARIATRIC SURGERY  12/2015   BICEPS TENDON REPAIR     CARDIAC CATHETERIZATION  2000   DRUG INDUCED ENDOSCOPY N/A 08/26/2020   Procedure: DRUG INDUCED ENDOSCOPY;  Surgeon: Osborn Coho, MD;  Location: [facility];  Service: ENT;  Laterality: N/A;   IMPLANTATION OF HYPOGLOSSAL NERVE STIMULATOR Right 01/25/2021   Procedure: IMPLANTATION OF HYPOGLOSSAL NERVE STIMULATOR;  Surgeon: Osborn Coho, MD;  [facility] OR;  Service: ENT;  Laterality: Right;   KNEE ARTHROSCOPY Bilateral    septorhinoplasty     SHOULDER ARTHROSCOPY Bilateral    TONSILLECTOMY     UVULLA REMOVAL      Home Medications:  Allergies as of 09/12/2022   No Known Allergies      Medication List        Accurate as of September 12, 2022  1:31 PM. If you have any questions, ask your nurse or doctor.          aspirin 81 MG tablet Take 81 mg by mouth daily.   b complex vitamins tablet Take 1 tablet by mouth daily.   Calcium 500-100 MG-UNIT Chew Chew 1 tablet by mouth 4 (four) times daily.   cyanocobalamin 1000 MCG tablet Commonly known as: VITAMIN B12 Take 1,000 mcg by mouth daily.   ferrous sulfate 325 (65 FE) MG EC tablet Take 325 mg by mouth daily.   icosapent Ethyl 1 g  capsule Commonly known as: VASCEPA Take 1 g by mouth 3 (three) times daily.   Jardiance 25 MG Tabs tablet Generic drug: empagliflozin Take 12.5 mg by mouth daily.   Magnesium 250 MG Tabs Take 250 mg by mouth daily.   metFORMIN 850 MG tablet Commonly known as: GLUCOPHAGE Take 850 mg by mouth 2 (two) times daily with a meal.   metoprolol succinate 25 MG 24 hr tablet Commonly known as: TOPROL-XL TAKE 1 TABLET BY MOUTH  DAILY   multivitamin tablet Take 1 tablet by mouth 2 (two) times daily.   potassium chloride SA 20 MEQ tablet Commonly known as: KLOR-CON M Take 20 mEq by mouth 2 (two) times daily.   ramipril 2.5 MG capsule Commonly known as: ALTACE TAKE 1 CAPSULE BY MOUTH  DAILY   rosuvastatin 20 MG tablet Commonly known as: CRESTOR TAKE 1 TABLET(20 MG) BY MOUTH DAILY   SYSTANE OP Place 1 drop into both eyes daily as needed (dry eyes).   tamsulosin 0.4 MG Caps capsule Commonly known as: FLOMAX TAKE 1 CAPSULE(0.4 MG) BY MOUTH DAILY   Vitamin D 50 MCG (2000 UT) tablet Take 6,000 Units by mouth daily.        Allergies: No  Known Allergies  Family History: Family History  Problem Relation Age of Onset   Diabetes Mother    Diabetes Father    Heart attack Father     Social History:  reports that he has never smoked. He has never used smokeless tobacco. He reports that he does not currently use alcohol. He reports that he does not use drugs.  ROS: All other review of systems were reviewed and are negative except what is noted above in HPI  Physical Exam: BP 117/76   Pulse 94   Constitutional:  Alert and oriented, No acute distress. HEENT: [place] AT, moist mucus membranes.  Trachea midline, no masses. Cardiovascular: No clubbing, cyanosis, or edema. Respiratory: Normal respiratory effort, no increased work of breathing. GI: Abdomen is soft, nontender, nondistended, no abdominal masses GU: No CVA tenderness.  Lymph: No cervical or inguinal  lymphadenopathy. Skin: No rashes, bruises or suspicious lesions. Neurologic: Grossly intact, no focal deficits, moving all 4 extremities. Psychiatric: Normal mood and affect.  Laboratory Data: Lab Results  Component Value Date   WBC 8.5 01/18/2021   HGB 12.0 (L) 01/18/2021   HCT 37.8 (L) 01/18/2021   MCV 90.9 01/18/2021   PLT 159 01/18/2021    Lab Results  Component Value Date   CREATININE 0.83 01/18/2021    No results found for: "PSA"  No results found for: "TESTOSTERONE"  Lab Results  Component Value Date   HGBA1C (H) 02/19/2007    8.5 (NOTE)   The ADA recommends the following therapeutic goals for glycemic   control related to Hgb A1C measurement:   Goal of Therapy:   < 7.0% Hgb A1C   Action Suggested:  > 8.0% Hgb A1C   Ref:  Diabetes Care, 22, Suppl. 1, 1999    Urinalysis    Component Value Date/Time   APPEARANCEUR Clear 01/17/2022 1059   GLUCOSEU 3+ (A) 01/17/2022 1059   BILIRUBINUR Negative 01/17/2022 1059   PROTEINUR Trace 01/17/2022 1059   NITRITE Negative 01/17/2022 1059   LEUKOCYTESUR Negative 01/17/2022 1059    Lab Results  Component Value Date   LABMICR Comment 01/17/2022   WBCUA 0-5 07/18/2021   LABEPIT 0-10 07/18/2021   BACTERIA Few (A) 07/18/2021    Pertinent Imaging:  No results found for this or any previous visit.  No results found for this or any previous visit.  No results found for this or any previous visit.  No results found for this or any previous visit.  No results found for this or any previous visit.  No valid procedures specified. No results found for this or any previous visit.  No results found for this or any previous visit.   Assessment & Plan:    1. BPH with obstruction/lower urinary tract symptoms -we will trial uroxatral 10mg   - Urinalysis, Routine w reflex microscopic  2. Urinary frequency -continue uroxatral. If this fails to improve his LUTS we will proceed with OAB therapy.   3. Elevated PSA PSA  today   No follow-ups on file.  Wilkie Aye, MD  [facility] [place]

## 2022-09-12 NOTE — Patient Instructions (Signed)

## 2022-09-13 LAB — PSA: Prostate Specific Ag, Serum: 3.6 ng/mL (ref 0.0–4.0)

## 2022-09-19 DIAGNOSIS — E113412 Type 2 diabetes mellitus with severe nonproliferative diabetic retinopathy with macular edema, left eye: Secondary | ICD-10-CM | POA: Diagnosis not present

## 2022-09-19 DIAGNOSIS — E113411 Type 2 diabetes mellitus with severe nonproliferative diabetic retinopathy with macular edema, right eye: Secondary | ICD-10-CM | POA: Diagnosis not present

## 2022-09-20 DIAGNOSIS — S233XXA Sprain of ligaments of thoracic spine, initial encounter: Secondary | ICD-10-CM | POA: Diagnosis not present

## 2022-09-20 DIAGNOSIS — M9903 Segmental and somatic dysfunction of lumbar region: Secondary | ICD-10-CM | POA: Diagnosis not present

## 2022-09-20 DIAGNOSIS — M47816 Spondylosis without myelopathy or radiculopathy, lumbar region: Secondary | ICD-10-CM | POA: Diagnosis not present

## 2022-09-20 DIAGNOSIS — S134XXA Sprain of ligaments of cervical spine, initial encounter: Secondary | ICD-10-CM | POA: Diagnosis not present

## 2022-09-20 DIAGNOSIS — M9902 Segmental and somatic dysfunction of thoracic region: Secondary | ICD-10-CM | POA: Diagnosis not present

## 2022-09-20 DIAGNOSIS — M9901 Segmental and somatic dysfunction of cervical region: Secondary | ICD-10-CM | POA: Diagnosis not present

## 2022-10-03 DIAGNOSIS — I10 Essential (primary) hypertension: Secondary | ICD-10-CM | POA: Diagnosis not present

## 2022-10-03 DIAGNOSIS — E113411 Type 2 diabetes mellitus with severe nonproliferative diabetic retinopathy with macular edema, right eye: Secondary | ICD-10-CM | POA: Diagnosis not present

## 2022-10-03 DIAGNOSIS — E113412 Type 2 diabetes mellitus with severe nonproliferative diabetic retinopathy with macular edema, left eye: Secondary | ICD-10-CM | POA: Diagnosis not present

## 2022-10-03 DIAGNOSIS — E1143 Type 2 diabetes mellitus with diabetic autonomic (poly)neuropathy: Secondary | ICD-10-CM | POA: Diagnosis not present

## 2022-10-03 DIAGNOSIS — E113313 Type 2 diabetes mellitus with moderate nonproliferative diabetic retinopathy with macular edema, bilateral: Secondary | ICD-10-CM | POA: Diagnosis not present

## 2022-10-03 DIAGNOSIS — E7849 Other hyperlipidemia: Secondary | ICD-10-CM | POA: Diagnosis not present

## 2022-10-03 DIAGNOSIS — R5383 Other fatigue: Secondary | ICD-10-CM | POA: Diagnosis not present

## 2022-10-04 DIAGNOSIS — M9902 Segmental and somatic dysfunction of thoracic region: Secondary | ICD-10-CM | POA: Diagnosis not present

## 2022-10-04 DIAGNOSIS — S233XXA Sprain of ligaments of thoracic spine, initial encounter: Secondary | ICD-10-CM | POA: Diagnosis not present

## 2022-10-04 DIAGNOSIS — S134XXA Sprain of ligaments of cervical spine, initial encounter: Secondary | ICD-10-CM | POA: Diagnosis not present

## 2022-10-04 DIAGNOSIS — M9903 Segmental and somatic dysfunction of lumbar region: Secondary | ICD-10-CM | POA: Diagnosis not present

## 2022-10-04 DIAGNOSIS — M47816 Spondylosis without myelopathy or radiculopathy, lumbar region: Secondary | ICD-10-CM | POA: Diagnosis not present

## 2022-10-04 DIAGNOSIS — M9901 Segmental and somatic dysfunction of cervical region: Secondary | ICD-10-CM | POA: Diagnosis not present

## 2022-10-10 DIAGNOSIS — E7849 Other hyperlipidemia: Secondary | ICD-10-CM | POA: Diagnosis not present

## 2022-10-10 DIAGNOSIS — N401 Enlarged prostate with lower urinary tract symptoms: Secondary | ICD-10-CM | POA: Diagnosis not present

## 2022-10-10 DIAGNOSIS — Z9884 Bariatric surgery status: Secondary | ICD-10-CM | POA: Diagnosis not present

## 2022-10-10 DIAGNOSIS — D649 Anemia, unspecified: Secondary | ICD-10-CM | POA: Diagnosis not present

## 2022-10-10 DIAGNOSIS — E113313 Type 2 diabetes mellitus with moderate nonproliferative diabetic retinopathy with macular edema, bilateral: Secondary | ICD-10-CM | POA: Diagnosis not present

## 2022-10-10 DIAGNOSIS — I959 Hypotension, unspecified: Secondary | ICD-10-CM | POA: Diagnosis not present

## 2022-10-10 DIAGNOSIS — R972 Elevated prostate specific antigen [PSA]: Secondary | ICD-10-CM | POA: Diagnosis not present

## 2022-10-10 DIAGNOSIS — R030 Elevated blood-pressure reading, without diagnosis of hypertension: Secondary | ICD-10-CM | POA: Diagnosis not present

## 2022-10-10 DIAGNOSIS — I2510 Atherosclerotic heart disease of native coronary artery without angina pectoris: Secondary | ICD-10-CM | POA: Diagnosis not present

## 2022-10-10 DIAGNOSIS — E876 Hypokalemia: Secondary | ICD-10-CM | POA: Diagnosis not present

## 2022-10-10 DIAGNOSIS — E1143 Type 2 diabetes mellitus with diabetic autonomic (poly)neuropathy: Secondary | ICD-10-CM | POA: Diagnosis not present

## 2022-10-10 DIAGNOSIS — Z955 Presence of coronary angioplasty implant and graft: Secondary | ICD-10-CM | POA: Diagnosis not present

## 2022-10-18 DIAGNOSIS — M47816 Spondylosis without myelopathy or radiculopathy, lumbar region: Secondary | ICD-10-CM | POA: Diagnosis not present

## 2022-10-18 DIAGNOSIS — S233XXA Sprain of ligaments of thoracic spine, initial encounter: Secondary | ICD-10-CM | POA: Diagnosis not present

## 2022-10-18 DIAGNOSIS — M9902 Segmental and somatic dysfunction of thoracic region: Secondary | ICD-10-CM | POA: Diagnosis not present

## 2022-10-18 DIAGNOSIS — M9901 Segmental and somatic dysfunction of cervical region: Secondary | ICD-10-CM | POA: Diagnosis not present

## 2022-10-18 DIAGNOSIS — S134XXA Sprain of ligaments of cervical spine, initial encounter: Secondary | ICD-10-CM | POA: Diagnosis not present

## 2022-10-18 DIAGNOSIS — M9903 Segmental and somatic dysfunction of lumbar region: Secondary | ICD-10-CM | POA: Diagnosis not present

## 2022-10-26 DIAGNOSIS — D649 Anemia, unspecified: Secondary | ICD-10-CM | POA: Diagnosis not present

## 2022-10-26 DIAGNOSIS — E876 Hypokalemia: Secondary | ICD-10-CM | POA: Diagnosis not present

## 2022-10-26 DIAGNOSIS — E113313 Type 2 diabetes mellitus with moderate nonproliferative diabetic retinopathy with macular edema, bilateral: Secondary | ICD-10-CM | POA: Diagnosis not present

## 2022-10-26 DIAGNOSIS — E1143 Type 2 diabetes mellitus with diabetic autonomic (poly)neuropathy: Secondary | ICD-10-CM | POA: Diagnosis not present

## 2022-10-30 DIAGNOSIS — G4733 Obstructive sleep apnea (adult) (pediatric): Secondary | ICD-10-CM | POA: Diagnosis not present

## 2022-11-05 DIAGNOSIS — Z4542 Encounter for adjustment and management of neuropacemaker (brain) (peripheral nerve) (spinal cord): Secondary | ICD-10-CM | POA: Diagnosis not present

## 2022-11-05 DIAGNOSIS — G4733 Obstructive sleep apnea (adult) (pediatric): Secondary | ICD-10-CM | POA: Diagnosis not present

## 2022-11-07 DIAGNOSIS — E113411 Type 2 diabetes mellitus with severe nonproliferative diabetic retinopathy with macular edema, right eye: Secondary | ICD-10-CM | POA: Diagnosis not present

## 2022-11-07 DIAGNOSIS — E113412 Type 2 diabetes mellitus with severe nonproliferative diabetic retinopathy with macular edema, left eye: Secondary | ICD-10-CM | POA: Diagnosis not present

## 2022-11-08 DIAGNOSIS — S233XXA Sprain of ligaments of thoracic spine, initial encounter: Secondary | ICD-10-CM | POA: Diagnosis not present

## 2022-11-08 DIAGNOSIS — M47816 Spondylosis without myelopathy or radiculopathy, lumbar region: Secondary | ICD-10-CM | POA: Diagnosis not present

## 2022-11-08 DIAGNOSIS — Z6830 Body mass index (BMI) 30.0-30.9, adult: Secondary | ICD-10-CM | POA: Diagnosis not present

## 2022-11-08 DIAGNOSIS — M9902 Segmental and somatic dysfunction of thoracic region: Secondary | ICD-10-CM | POA: Diagnosis not present

## 2022-11-08 DIAGNOSIS — E876 Hypokalemia: Secondary | ICD-10-CM | POA: Diagnosis not present

## 2022-11-08 DIAGNOSIS — R5383 Other fatigue: Secondary | ICD-10-CM | POA: Diagnosis not present

## 2022-11-08 DIAGNOSIS — M9901 Segmental and somatic dysfunction of cervical region: Secondary | ICD-10-CM | POA: Diagnosis not present

## 2022-11-08 DIAGNOSIS — E1143 Type 2 diabetes mellitus with diabetic autonomic (poly)neuropathy: Secondary | ICD-10-CM | POA: Diagnosis not present

## 2022-11-08 DIAGNOSIS — S134XXA Sprain of ligaments of cervical spine, initial encounter: Secondary | ICD-10-CM | POA: Diagnosis not present

## 2022-11-08 DIAGNOSIS — M9903 Segmental and somatic dysfunction of lumbar region: Secondary | ICD-10-CM | POA: Diagnosis not present

## 2022-11-08 DIAGNOSIS — I959 Hypotension, unspecified: Secondary | ICD-10-CM | POA: Diagnosis not present

## 2022-11-08 DIAGNOSIS — R030 Elevated blood-pressure reading, without diagnosis of hypertension: Secondary | ICD-10-CM | POA: Diagnosis not present

## 2022-11-09 ENCOUNTER — Ambulatory Visit: Payer: PPO | Admitting: Urology

## 2022-11-09 DIAGNOSIS — R030 Elevated blood-pressure reading, without diagnosis of hypertension: Secondary | ICD-10-CM | POA: Diagnosis not present

## 2022-11-09 DIAGNOSIS — J189 Pneumonia, unspecified organism: Secondary | ICD-10-CM | POA: Diagnosis not present

## 2022-11-09 DIAGNOSIS — D72829 Elevated white blood cell count, unspecified: Secondary | ICD-10-CM | POA: Diagnosis not present

## 2022-11-09 DIAGNOSIS — R5383 Other fatigue: Secondary | ICD-10-CM | POA: Diagnosis not present

## 2022-11-14 DIAGNOSIS — R5383 Other fatigue: Secondary | ICD-10-CM | POA: Diagnosis not present

## 2022-11-14 DIAGNOSIS — Z6830 Body mass index (BMI) 30.0-30.9, adult: Secondary | ICD-10-CM | POA: Diagnosis not present

## 2022-11-14 DIAGNOSIS — D72829 Elevated white blood cell count, unspecified: Secondary | ICD-10-CM | POA: Diagnosis not present

## 2022-11-14 DIAGNOSIS — Z9884 Bariatric surgery status: Secondary | ICD-10-CM | POA: Diagnosis not present

## 2022-11-14 DIAGNOSIS — R030 Elevated blood-pressure reading, without diagnosis of hypertension: Secondary | ICD-10-CM | POA: Diagnosis not present

## 2022-11-14 DIAGNOSIS — T50Z95A Adverse effect of other vaccines and biological substances, initial encounter: Secondary | ICD-10-CM | POA: Diagnosis not present

## 2022-11-14 DIAGNOSIS — R600 Localized edema: Secondary | ICD-10-CM | POA: Diagnosis not present

## 2022-11-14 DIAGNOSIS — E1143 Type 2 diabetes mellitus with diabetic autonomic (poly)neuropathy: Secondary | ICD-10-CM | POA: Diagnosis not present

## 2022-11-15 DIAGNOSIS — M9901 Segmental and somatic dysfunction of cervical region: Secondary | ICD-10-CM | POA: Diagnosis not present

## 2022-11-15 DIAGNOSIS — M47816 Spondylosis without myelopathy or radiculopathy, lumbar region: Secondary | ICD-10-CM | POA: Diagnosis not present

## 2022-11-15 DIAGNOSIS — M9902 Segmental and somatic dysfunction of thoracic region: Secondary | ICD-10-CM | POA: Diagnosis not present

## 2022-11-15 DIAGNOSIS — S134XXA Sprain of ligaments of cervical spine, initial encounter: Secondary | ICD-10-CM | POA: Diagnosis not present

## 2022-11-15 DIAGNOSIS — S233XXA Sprain of ligaments of thoracic spine, initial encounter: Secondary | ICD-10-CM | POA: Diagnosis not present

## 2022-11-15 DIAGNOSIS — M9903 Segmental and somatic dysfunction of lumbar region: Secondary | ICD-10-CM | POA: Diagnosis not present

## 2022-11-19 ENCOUNTER — Ambulatory Visit: Payer: PPO | Admitting: Nurse Practitioner

## 2022-11-20 DIAGNOSIS — K449 Diaphragmatic hernia without obstruction or gangrene: Secondary | ICD-10-CM | POA: Diagnosis not present

## 2022-11-20 DIAGNOSIS — N400 Enlarged prostate without lower urinary tract symptoms: Secondary | ICD-10-CM | POA: Diagnosis not present

## 2022-11-20 DIAGNOSIS — N3289 Other specified disorders of bladder: Secondary | ICD-10-CM | POA: Diagnosis not present

## 2022-11-20 DIAGNOSIS — N281 Cyst of kidney, acquired: Secondary | ICD-10-CM | POA: Diagnosis not present

## 2022-11-20 DIAGNOSIS — R194 Change in bowel habit: Secondary | ICD-10-CM | POA: Diagnosis not present

## 2022-11-20 DIAGNOSIS — R5383 Other fatigue: Secondary | ICD-10-CM | POA: Diagnosis not present

## 2022-11-20 DIAGNOSIS — Z903 Acquired absence of stomach [part of]: Secondary | ICD-10-CM | POA: Diagnosis not present

## 2022-11-20 DIAGNOSIS — K819 Cholecystitis, unspecified: Secondary | ICD-10-CM | POA: Diagnosis not present

## 2022-11-20 DIAGNOSIS — K810 Acute cholecystitis: Secondary | ICD-10-CM | POA: Diagnosis not present

## 2022-11-20 DIAGNOSIS — Z9884 Bariatric surgery status: Secondary | ICD-10-CM | POA: Diagnosis not present

## 2022-11-26 NOTE — Progress Notes (Unsigned)
Cardiology Clinic Note   Patient Name: [name] Date of Encounter: 11/28/2022  Primary Care Provider:  Juliette Alcide, MD Primary Cardiologist:  Dina Rich, MD  Patient Profile    [name] 67 year old male presents to the clinic today for evaluation of his chest discomfort and lower extremity swelling.  Past Medical History    Past Medical History:  Diagnosis Date   Arthritis    generalized   Coronary artery disease    stents placed 2003   Hyperlipidemia    Hypertension    Sleep apnea    on CPAP   Past Surgical History:  Procedure Laterality Date   BARIATRIC SURGERY  12/2015   BICEPS TENDON REPAIR     CARDIAC CATHETERIZATION  2000   DRUG INDUCED ENDOSCOPY N/A 08/26/2020   Procedure: DRUG INDUCED ENDOSCOPY;  Surgeon: Osborn Coho, MD;  Location: [facility];  Service: ENT;  Laterality: N/A;   IMPLANTATION OF HYPOGLOSSAL NERVE STIMULATOR Right 01/25/2021   Procedure: IMPLANTATION OF HYPOGLOSSAL NERVE STIMULATOR;  Surgeon: Osborn Coho, MD;  Location: [facility] OR;  Service: ENT;  Laterality: Right;   KNEE ARTHROSCOPY Bilateral    septorhinoplasty     SHOULDER ARTHROSCOPY Bilateral    TONSILLECTOMY     UVULLA REMOVAL      Allergies  No Known Allergies  History of Present Illness    [name] has a PMH of HTN, HLD, type 2 diabetes, and coronary artery disease.  He underwent cardiac catheterization with repeat balloon angioplasty to his LAD stent in 2005.  He underwent LHC in 2008 which showed patent stent and coronary arteries.  His echocardiogram 5/17 showed an LVEF of 60-65%.  He underwent nuclear stress testing 6/17 which showed no ischemia.  He retired in 2010.  He was in management at [employer]  He was seen in follow-up by Dr. Wyline Mood on 03/19/2022.  During that time he denied chest pain and shortness of breath.  He was using his elliptical 2 times per week for 35 minutes and denied exertional symptoms.  He was referred by  his PCP after having shingles vaccination.  Apparently he had an allergic reaction and developed chest discomfort and lower extremity swelling.  He presents to the clinic today for follow-up evaluation and states after receiving the shingles vaccine he developed body aches, lower extremity swelling, extreme fatigue, and loss of appetite.  He denies sick contacts.  He presented to his PCP who drew labs and noted an elevated white count.  He received a course of antibiotics.  He reports that his fatigue continues to be extreme after about 4 to 5 hours of normal daily activity.  His lower extremity swelling has significantly improved.  His blood pressure today is 112/66.  His pulse was noted to be 103.  We reviewed his previous cardiac catheterizations and symptoms that presented after the vaccination.  I will order an echocardiogram and plan follow-up in 1 to 2 months..  Today he denies chest pain, shortness of breath,   melena, hematuria, hemoptysis, diaphoresis, weakness, presyncope, syncope, orthopnea, and PND.     Home Medications    Prior to Admission medications   Medication Sig Start Date End Date Taking? Authorizing Provider  alfuzosin (UROXATRAL) 10 MG 24 hr tablet Take 1 tablet (10 mg total) by mouth at bedtime. 09/12/22   McKenzie, Mardene Celeste, MD  aspirin 81 MG tablet Take 81 mg by mouth daily.    [provider]  b  complex vitamins tablet Take 1 tablet by mouth daily.    [provider]  Calcium 500-100 MG-UNIT CHEW Chew 1 tablet by mouth 4 (four) times daily.    [provider]  Cholecalciferol (VITAMIN D) 50 MCG (2000 UT) tablet Take 6,000 Units by mouth daily.    [provider]  empagliflozin (JARDIANCE) 25 MG TABS tablet Take 12.5 mg by mouth daily.    [provider]  ferrous sulfate 325 (65 FE) MG EC tablet Take 325 mg by mouth daily.    [provider]  icosapent Ethyl (VASCEPA) 1 g capsule Take 1 g by mouth 3 (three) times  daily.    [provider]  Magnesium 250 MG TABS Take 250 mg by mouth daily.    [provider]  metFORMIN (GLUCOPHAGE) 850 MG tablet Take 850 mg by mouth 2 (two) times daily with a meal.    [provider]  metoprolol succinate (TOPROL-XL) 25 MG 24 hr tablet TAKE 1 TABLET BY MOUTH  DAILY 09/15/18   Jodelle Gross, NP  Multiple Vitamin (MULTIVITAMIN) tablet Take 1 tablet by mouth 2 (two) times daily.    [provider]  Polyethyl Glycol-Propyl Glycol (SYSTANE OP) Place 1 drop into both eyes daily as needed (dry eyes).    [provider]  potassium chloride SA (K-DUR,KLOR-CON) 20 MEQ tablet Take 20 mEq by mouth 2 (two) times daily.  11/29/16   [provider]  ramipril (ALTACE) 2.5 MG capsule TAKE 1 CAPSULE BY MOUTH  DAILY 10/31/18   Antoine Poche, MD  rosuvastatin (CRESTOR) 20 MG tablet TAKE 1 TABLET(20 MG) BY MOUTH DAILY 02/01/20   Antoine Poche, MD  tamsulosin (FLOMAX) 0.4 MG CAPS capsule TAKE 1 CAPSULE(0.4 MG) BY MOUTH DAILY 04/09/22   Stoneking, Danford Bad., MD  vitamin B-12 (CYANOCOBALAMIN) 1000 MCG tablet Take 1,000 mcg by mouth daily.    [provider]    Family History    Family History  Problem Relation Age of Onset   Diabetes Mother    Diabetes Father    Heart attack Father    He indicated that his mother is deceased. He indicated that his father is deceased. He indicated that his sister is alive.  Social History    Social History   Socioeconomic History   Marital status: Married    Spouse name: Not on file   Number of children: Not on file   Years of education: Not on file   Highest education level: Not on file  Occupational History   Not on file  Tobacco Use   Smoking status: Never   Smokeless tobacco: Never  Vaping Use   Vaping status: Never Used  Substance and Sexual Activity   Alcohol use: Not Currently    Alcohol/week: 0.0 standard drinks of alcohol   Drug use: Never   Sexual activity:  Not on file  Other Topics Concern   Not on file  Social History Narrative   Not on file   Social Determinants of Health   Financial Resource Strain: Not on file  Food Insecurity: Not on file  Transportation Needs: Not on file  Physical Activity: Not on file  Stress: Not on file  Social Connections: Not on file  Intimate Partner Violence: Not on file     Review of Systems    General:  No chills, fever, night sweats or weight changes.  Cardiovascular:  No chest pain, dyspnea on exertion, edema, orthopnea, palpitations, paroxysmal nocturnal dyspnea.  Dermatological: No rash, lesions/masses Respiratory: No cough, dyspnea Urologic: No hematuria, dysuria Abdominal:   No nausea, vomiting, diarrhea, bright red blood per rectum, melena, or hematemesis Neurologic:  No visual changes, wkns, changes in mental status. All other systems reviewed and are otherwise negative except as noted above.  Physical Exam    VS:  BP 112/66 (BP Location: Right Arm, Patient Position: Sitting, Cuff Size: Normal)   Pulse (!) 103   Ht 5\' 11"  (1.803 m)   Wt 213 lb (96.6 kg)   BMI 29.71 kg/m  , BMI Body mass index is 29.71 kg/m. GEN: Well nourished, well developed, in no acute distress. HEENT: normal. Neck: Supple, no JVD, carotid bruits, or masses. Cardiac: RRR, no murmurs, rubs, or gallops. No clubbing, cyanosis, bilateral 2+ pitting ankle edema.  Radials/DP/PT 2+ and equal bilaterally.  Respiratory:  Respirations regular and unlabored, clear to auscultation bilaterally. GI: Soft, nontender, nondistended, BS + x 4. MS: no deformity or atrophy. Skin: warm and dry, no rash. Neuro:  Strength and sensation are intact. Psych: Normal affect.  Accessory Clinical Findings    Recent Labs: No results found for requested labs within last 365 days.   Recent Lipid Panel    Component Value Date/Time   CHOL  02/20/2007 0313    178        ATP III CLASSIFICATION:  <200     mg/dL   Desirable  161-096  mg/dL    Borderline High  >=045    mg/dL   High   TRIG 409 (H) 81/19/1478 0313   HDL 30 (L) 02/20/2007 0313   CHOLHDL 5.9 02/20/2007 0313   VLDL 54 (H) 02/20/2007 0313   LDLCALC  02/20/2007 0313    94        Total Cholesterol/HDL:CHD Risk Coronary Heart Disease Risk Table                     Men   Women  1/2 Average Risk   3.4   3.3         ECG personally reviewed by me today- EKG Interpretation Date/Time:  Wednesday November 28 2022 09:43:00 EDT Ventricular Rate:  103 PR Interval:  188 QRS Duration:  86 QT Interval:  346 QTC Calculation: 453 R Axis:   -41  Text Interpretation: Sinus tachycardia Left axis deviation Confirmed by Edd Fabian [phone]) on 11/28/2022 9:50:10 AM   Echocardiogram 08/04/2015  tudy Conclusions   - Left ventricle: The cavity size was normal. Wall thickness was    increased in a pattern of moderate LVH. Systolic function was    normal. The estimated ejection fraction was in the range of 60%    to 65%. Wall motion was normal; there were no regional wall    motion abnormalities. Left ventricular diastolic function    parameters were normal.  - Aortic valve: Valve area (VTI): 4.3 cm^2. Valve area (Vmax): 4.12    cm^2. Valve area (Vmean): 3.19 cm^2.  - Technically adequate study.   Transthoracic echocardiography.  M-mode, complete 2D, spectral  Doppler, and color Doppler.  Birthdate:  Patient birthdate:  Jul 27, 1955.  Age:  Patient is 67 yr old.  Sex:  Gender: male.  BMI: 43.1 kg/m^2.  Blood pressure:     120/70  Patient status:  Outpatient.  Study date:  Study date: 08/04/2015. Study time: 08:52  AM.   -------------------------------------------------------------------   -------------------------------------------------------------------  Left ventricle:  The cavity size was normal. Wall thickness was  increased in a pattern  of moderate LVH. Systolic function was  normal. The estimated ejection fraction was in the range of 60% to  65%. Wall motion  was normal; there were no regional wall motion  abnormalities. Left ventricular diastolic function parameters were  normal.   -------------------------------------------------------------------  Aortic valve:   Trileaflet; normal thickness leaflets.  Doppler:  There was no stenosis.   There was no significant regurgitation.  VTI ratio of LVOT to aortic valve: 0.95. Valve area (VTI): 4.3  cm^2. Indexed valve area (VTI): 1.58 cm^2/m^2. Peak velocity ratio  of LVOT to aortic valve: 0.91. Valve area (Vmax): 4.12 cm^2.  Indexed valve area (Vmax): 1.52 cm^2/m^2. Mean velocity ratio of  LVOT to aortic valve: 0.92. Valve area (Vmean): 3.19 cm^2. Indexed  valve area (Vmean): 1.18 cm^2/m^2.    Mean gradient (S): 3 mm Hg.  Peak gradient (S): 6 mm Hg.   -------------------------------------------------------------------  Aorta: Aortic root: The aortic root was normal in size.   -------------------------------------------------------------------  Mitral valve:   Normal thickness leaflets .  Doppler:   There was  no evidence for stenosis.   There was no significant regurgitation.     Peak gradient (D): 2 mm Hg.   -------------------------------------------------------------------  Left atrium:  The atrium was normal in size.   -------------------------------------------------------------------  Atrial septum:  Poorly visualized.   -------------------------------------------------------------------  Right ventricle:  The cavity size was normal. Wall thickness was  normal. Systolic function was normal.   -------------------------------------------------------------------  Pulmonic valve:   Not well visualized.  Doppler:   There was no  evidence for stenosis.   There was no significant regurgitation.    -------------------------------------------------------------------  Tricuspid valve:   Normal thickness leaflets.  Doppler:   There was  no evidence for stenosis.   There was trivial  regurgitation.   -------------------------------------------------------------------  Pulmonary artery:    Systolic pressure could not be accurately  estimated.   Inadequate TR jet.   -------------------------------------------------------------------  Right atrium:  The atrium was normal in size.   -------------------------------------------------------------------  Pericardium: There was no pericardial effusion.        Assessment & Plan   1.  Coronary artery disease-noted chest tightness and pressure after having shingles vaccination.  Denies exertional chest discomfort.  Underwent nuclear stress testing 6/17 which showed low risk and no ischemia. Continue Crestor, Vascepa Heart healthy low-sodium diet-salty 6 given Increase physical activity as tolerated  Fatigue, lower extremity edema-2+ pitting bilateral ankle edema.  Developed post shingles vaccination over the last 1-2 weeks.  Weight today 213 pounds.  Notes improving fatigue and improving lower extremity swelling. Elevate lower extremities when not active Heart healthy low-sodium diet-salty 6 given Order echocardiogram  Essential hypertension-BP today 112/66. Maintain blood pressure log Continue current medical therapy Low-sodium diet  Hyperlipidemia-LDL 47 on 03/28/21. Continue Lovaza, rosuvastatin High-fiber diet  Type 2 diabetes-A1c 5.6 on 8/23 Followed by endocrinology  Disposition: Follow-up with Dr. Wyline Mood or me in 2 months.   Thomasene Ripple. Chea Malan NP-C     11/28/2022, 9:50 AM [facility] [address] Office [phone] Fax [phone]    I spent 14 minutes examining this patient, reviewing medications, and using patient centered shared decision making involving her cardiac care.  Prior to her visit I spent greater than 20 minutes reviewing her past medical history,  medications, and prior cardiac tests.

## 2022-11-28 ENCOUNTER — Encounter: Payer: Self-pay | Admitting: General Practice

## 2022-11-28 ENCOUNTER — Other Ambulatory Visit: Payer: Self-pay

## 2022-11-28 ENCOUNTER — Ambulatory Visit: Payer: PPO | Attending: Nurse Practitioner | Admitting: General Practice

## 2022-11-28 VITALS — BP 112/66 | HR 103 | Ht 71.0 in | Wt 213.0 lb

## 2022-11-28 DIAGNOSIS — R5383 Other fatigue: Secondary | ICD-10-CM

## 2022-11-28 DIAGNOSIS — I2510 Atherosclerotic heart disease of native coronary artery without angina pectoris: Secondary | ICD-10-CM | POA: Diagnosis not present

## 2022-11-28 DIAGNOSIS — R600 Localized edema: Secondary | ICD-10-CM | POA: Diagnosis not present

## 2022-11-28 DIAGNOSIS — I4711 Inappropriate sinus tachycardia, so stated: Secondary | ICD-10-CM | POA: Diagnosis not present

## 2022-11-28 DIAGNOSIS — I10 Essential (primary) hypertension: Secondary | ICD-10-CM

## 2022-11-28 DIAGNOSIS — E113411 Type 2 diabetes mellitus with severe nonproliferative diabetic retinopathy with macular edema, right eye: Secondary | ICD-10-CM | POA: Diagnosis not present

## 2022-11-28 DIAGNOSIS — E113412 Type 2 diabetes mellitus with severe nonproliferative diabetic retinopathy with macular edema, left eye: Secondary | ICD-10-CM | POA: Diagnosis not present

## 2022-11-28 DIAGNOSIS — I444 Left anterior fascicular block: Secondary | ICD-10-CM | POA: Diagnosis not present

## 2022-11-28 DIAGNOSIS — E782 Mixed hyperlipidemia: Secondary | ICD-10-CM | POA: Diagnosis not present

## 2022-11-28 DIAGNOSIS — E119 Type 2 diabetes mellitus without complications: Secondary | ICD-10-CM

## 2022-11-28 NOTE — Patient Instructions (Signed)
Medication Instructions:  The current medical regimen is effective;  continue present plan and medications as directed. Please refer to the Current Medication list given to you today.  *If you need a refill on your cardiac medications before your next appointment, please call your pharmacy*  Lab Work: NONE If you have labs (blood work) drawn today and your tests are completely normal, you will receive your results only by:  MyChart Message (if you have MyChart) OR  A paper copy in the mail If you have any lab test that is abnormal or we need to change your treatment, we will call you to review the results.  Testing/Procedures: Your physician has requested that you have an echocardiogram. Echocardiography is a painless test that uses sound waves to create images of your heart. It provides your doctor with information about the size and shape of your heart and how well your heart's chambers and valves are working. This procedure takes approximately one hour. There are no restrictions for this procedure. Please do NOT wear cologne, perfume, aftershave, or lotions (deodorant is allowed). Please arrive 15 minutes prior to your appointment time.   PLEASE READ AND FOLLOW ATTACHED  SALTY 6 INCREASE PHYSICAL ACTIVITY-AS TOLERATED  Follow-Up: At [facility], you and your health needs are our priority.  As part of our continuing mission to provide you with exceptional heart care, we have created designated Provider Care Teams.  These Care Teams include your primary Cardiologist (physician) and Advanced Practice Providers (APPs -  Physician Assistants and Nurse Practitioners) who all work together to provide you with the care you need, when you need it.  Your next appointment:   1-2 month(s)  Provider:   Dina Rich, MD -or- Edd Fabian, FNP-C

## 2022-11-29 DIAGNOSIS — S134XXA Sprain of ligaments of cervical spine, initial encounter: Secondary | ICD-10-CM | POA: Diagnosis not present

## 2022-11-29 DIAGNOSIS — M9902 Segmental and somatic dysfunction of thoracic region: Secondary | ICD-10-CM | POA: Diagnosis not present

## 2022-11-29 DIAGNOSIS — M9903 Segmental and somatic dysfunction of lumbar region: Secondary | ICD-10-CM | POA: Diagnosis not present

## 2022-11-29 DIAGNOSIS — M47816 Spondylosis without myelopathy or radiculopathy, lumbar region: Secondary | ICD-10-CM | POA: Diagnosis not present

## 2022-11-29 DIAGNOSIS — M9901 Segmental and somatic dysfunction of cervical region: Secondary | ICD-10-CM | POA: Diagnosis not present

## 2022-11-29 DIAGNOSIS — S233XXA Sprain of ligaments of thoracic spine, initial encounter: Secondary | ICD-10-CM | POA: Diagnosis not present

## 2022-12-01 DIAGNOSIS — I2510 Atherosclerotic heart disease of native coronary artery without angina pectoris: Secondary | ICD-10-CM | POA: Diagnosis not present

## 2022-12-01 DIAGNOSIS — J439 Emphysema, unspecified: Secondary | ICD-10-CM | POA: Diagnosis not present

## 2022-12-01 DIAGNOSIS — Z7984 Long term (current) use of oral hypoglycemic drugs: Secondary | ICD-10-CM | POA: Diagnosis not present

## 2022-12-01 DIAGNOSIS — E119 Type 2 diabetes mellitus without complications: Secondary | ICD-10-CM | POA: Diagnosis not present

## 2022-12-01 DIAGNOSIS — Z9682 Presence of neurostimulator: Secondary | ICD-10-CM | POA: Diagnosis not present

## 2022-12-01 DIAGNOSIS — R7989 Other specified abnormal findings of blood chemistry: Secondary | ICD-10-CM | POA: Diagnosis not present

## 2022-12-01 DIAGNOSIS — K810 Acute cholecystitis: Secondary | ICD-10-CM | POA: Diagnosis not present

## 2022-12-01 DIAGNOSIS — I4510 Unspecified right bundle-branch block: Secondary | ICD-10-CM | POA: Diagnosis not present

## 2022-12-01 DIAGNOSIS — K449 Diaphragmatic hernia without obstruction or gangrene: Secondary | ICD-10-CM | POA: Diagnosis not present

## 2022-12-01 DIAGNOSIS — I10 Essential (primary) hypertension: Secondary | ICD-10-CM | POA: Diagnosis not present

## 2022-12-01 DIAGNOSIS — Z79899 Other long term (current) drug therapy: Secondary | ICD-10-CM | POA: Diagnosis not present

## 2022-12-01 DIAGNOSIS — N400 Enlarged prostate without lower urinary tract symptoms: Secondary | ICD-10-CM | POA: Diagnosis not present

## 2022-12-01 DIAGNOSIS — K828 Other specified diseases of gallbladder: Secondary | ICD-10-CM | POA: Diagnosis not present

## 2022-12-01 DIAGNOSIS — R109 Unspecified abdominal pain: Secondary | ICD-10-CM | POA: Diagnosis not present

## 2022-12-01 DIAGNOSIS — R748 Abnormal levels of other serum enzymes: Secondary | ICD-10-CM | POA: Diagnosis not present

## 2022-12-01 DIAGNOSIS — E7800 Pure hypercholesterolemia, unspecified: Secondary | ICD-10-CM | POA: Diagnosis not present

## 2022-12-01 DIAGNOSIS — Z7982 Long term (current) use of aspirin: Secondary | ICD-10-CM | POA: Diagnosis not present

## 2022-12-02 DIAGNOSIS — E663 Overweight: Secondary | ICD-10-CM | POA: Diagnosis not present

## 2022-12-02 DIAGNOSIS — R059 Cough, unspecified: Secondary | ICD-10-CM | POA: Diagnosis not present

## 2022-12-02 DIAGNOSIS — G4733 Obstructive sleep apnea (adult) (pediatric): Secondary | ICD-10-CM | POA: Diagnosis not present

## 2022-12-02 DIAGNOSIS — K810 Acute cholecystitis: Secondary | ICD-10-CM | POA: Diagnosis not present

## 2022-12-02 DIAGNOSIS — Z955 Presence of coronary angioplasty implant and graft: Secondary | ICD-10-CM | POA: Diagnosis not present

## 2022-12-02 DIAGNOSIS — E785 Hyperlipidemia, unspecified: Secondary | ICD-10-CM | POA: Diagnosis not present

## 2022-12-02 DIAGNOSIS — Z6828 Body mass index (BMI) 28.0-28.9, adult: Secondary | ICD-10-CM | POA: Diagnosis not present

## 2022-12-02 DIAGNOSIS — K82A1 Gangrene of gallbladder in cholecystitis: Secondary | ICD-10-CM | POA: Diagnosis not present

## 2022-12-02 DIAGNOSIS — I2510 Atherosclerotic heart disease of native coronary artery without angina pectoris: Secondary | ICD-10-CM | POA: Diagnosis not present

## 2022-12-02 DIAGNOSIS — I10 Essential (primary) hypertension: Secondary | ICD-10-CM | POA: Diagnosis not present

## 2022-12-02 DIAGNOSIS — G4730 Sleep apnea, unspecified: Secondary | ICD-10-CM | POA: Diagnosis not present

## 2022-12-02 DIAGNOSIS — Z9884 Bariatric surgery status: Secondary | ICD-10-CM | POA: Diagnosis not present

## 2022-12-02 DIAGNOSIS — K8000 Calculus of gallbladder with acute cholecystitis without obstruction: Secondary | ICD-10-CM | POA: Diagnosis not present

## 2022-12-02 DIAGNOSIS — Z9689 Presence of other specified functional implants: Secondary | ICD-10-CM | POA: Diagnosis not present

## 2022-12-02 DIAGNOSIS — K811 Chronic cholecystitis: Secondary | ICD-10-CM | POA: Diagnosis not present

## 2022-12-02 DIAGNOSIS — E119 Type 2 diabetes mellitus without complications: Secondary | ICD-10-CM | POA: Diagnosis not present

## 2022-12-02 DIAGNOSIS — K660 Peritoneal adhesions (postprocedural) (postinfection): Secondary | ICD-10-CM | POA: Diagnosis not present

## 2022-12-06 DIAGNOSIS — K8000 Calculus of gallbladder with acute cholecystitis without obstruction: Secondary | ICD-10-CM | POA: Diagnosis not present

## 2022-12-06 DIAGNOSIS — E663 Overweight: Secondary | ICD-10-CM | POA: Diagnosis not present

## 2022-12-06 DIAGNOSIS — G4733 Obstructive sleep apnea (adult) (pediatric): Secondary | ICD-10-CM | POA: Diagnosis not present

## 2022-12-06 DIAGNOSIS — I2510 Atherosclerotic heart disease of native coronary artery without angina pectoris: Secondary | ICD-10-CM | POA: Diagnosis not present

## 2022-12-10 DIAGNOSIS — Z1330 Encounter for screening examination for mental health and behavioral disorders, unspecified: Secondary | ICD-10-CM | POA: Diagnosis not present

## 2022-12-10 DIAGNOSIS — Z09 Encounter for follow-up examination after completed treatment for conditions other than malignant neoplasm: Secondary | ICD-10-CM | POA: Diagnosis not present

## 2022-12-18 DIAGNOSIS — Z9884 Bariatric surgery status: Secondary | ICD-10-CM | POA: Diagnosis not present

## 2022-12-18 DIAGNOSIS — R5383 Other fatigue: Secondary | ICD-10-CM | POA: Diagnosis not present

## 2022-12-18 DIAGNOSIS — E1143 Type 2 diabetes mellitus with diabetic autonomic (poly)neuropathy: Secondary | ICD-10-CM | POA: Diagnosis not present

## 2022-12-18 DIAGNOSIS — Z6829 Body mass index (BMI) 29.0-29.9, adult: Secondary | ICD-10-CM | POA: Diagnosis not present

## 2022-12-18 DIAGNOSIS — R600 Localized edema: Secondary | ICD-10-CM | POA: Diagnosis not present

## 2022-12-18 DIAGNOSIS — R030 Elevated blood-pressure reading, without diagnosis of hypertension: Secondary | ICD-10-CM | POA: Diagnosis not present

## 2022-12-18 DIAGNOSIS — K810 Acute cholecystitis: Secondary | ICD-10-CM | POA: Diagnosis not present

## 2022-12-26 DIAGNOSIS — Z4542 Encounter for adjustment and management of neuropacemaker (brain) (peripheral nerve) (spinal cord): Secondary | ICD-10-CM | POA: Diagnosis not present

## 2022-12-26 DIAGNOSIS — G4733 Obstructive sleep apnea (adult) (pediatric): Secondary | ICD-10-CM | POA: Diagnosis not present

## 2022-12-31 ENCOUNTER — Ambulatory Visit (HOSPITAL_COMMUNITY)
Admission: RE | Admit: 2022-12-31 | Discharge: 2022-12-31 | Disposition: A | Discharge status: Home / Self Care | Payer: PPO | Source: Ambulatory Visit | Attending: General Practice | Admitting: General Practice

## 2022-12-31 DIAGNOSIS — R600 Localized edema: Secondary | ICD-10-CM | POA: Insufficient documentation

## 2022-12-31 DIAGNOSIS — R5383 Other fatigue: Secondary | ICD-10-CM | POA: Insufficient documentation

## 2022-12-31 DIAGNOSIS — I2510 Atherosclerotic heart disease of native coronary artery without angina pectoris: Secondary | ICD-10-CM | POA: Diagnosis not present

## 2022-12-31 LAB — ECHOCARDIOGRAM COMPLETE
Area-P 1/2: 3.34 cm2
Calc EF: 67.5 %
S' Lateral: 3.3 cm
Single Plane A2C EF: 56.5 %
Single Plane A4C EF: 75 %

## 2022-12-31 NOTE — Progress Notes (Signed)
  Echocardiogram 2D Echocardiogram has been performed.  [name] 12/31/2022, 9:25 AM

## 2023-01-01 ENCOUNTER — Other Ambulatory Visit (HOSPITAL_COMMUNITY): Payer: Self-pay

## 2023-01-01 ENCOUNTER — Telehealth: Payer: Self-pay | Admitting: General Practice

## 2023-01-01 DIAGNOSIS — R5383 Other fatigue: Secondary | ICD-10-CM

## 2023-01-01 DIAGNOSIS — I10 Essential (primary) hypertension: Secondary | ICD-10-CM

## 2023-01-01 NOTE — Telephone Encounter (Signed)
Patient is calling wanting to know if 10/28 appointment is still needed due to echo results.   Please advise.

## 2023-01-02 DIAGNOSIS — E113412 Type 2 diabetes mellitus with severe nonproliferative diabetic retinopathy with macular edema, left eye: Secondary | ICD-10-CM | POA: Diagnosis not present

## 2023-01-02 DIAGNOSIS — E113411 Type 2 diabetes mellitus with severe nonproliferative diabetic retinopathy with macular edema, right eye: Secondary | ICD-10-CM | POA: Diagnosis not present

## 2023-01-02 NOTE — Telephone Encounter (Signed)
Yes, he may follow-up in 12 months.  If he has change in his symptoms I would like him to have a sooner follow-up appointment.  Thank you.  Thomasene Ripple. Tara Wich NP-C     01/02/2023, 9:36 AM [facility] [address] Office [phone] Fax [phone]

## 2023-01-02 NOTE — Telephone Encounter (Signed)
Called patient cancelling 01/07/23 appointment.  Created recall for 2025 and advised patient he will receive a letter in the mail when it is time to contact the office to schedule.  Patient verbalized understanding and agreed to plan.

## 2023-01-04 DIAGNOSIS — K810 Acute cholecystitis: Secondary | ICD-10-CM | POA: Diagnosis not present

## 2023-01-04 DIAGNOSIS — E1143 Type 2 diabetes mellitus with diabetic autonomic (poly)neuropathy: Secondary | ICD-10-CM | POA: Diagnosis not present

## 2023-01-04 DIAGNOSIS — R5383 Other fatigue: Secondary | ICD-10-CM | POA: Diagnosis not present

## 2023-01-04 DIAGNOSIS — Z9884 Bariatric surgery status: Secondary | ICD-10-CM | POA: Diagnosis not present

## 2023-01-04 DIAGNOSIS — R600 Localized edema: Secondary | ICD-10-CM | POA: Diagnosis not present

## 2023-01-07 ENCOUNTER — Ambulatory Visit: Payer: PPO | Admitting: General Practice

## 2023-01-07 DIAGNOSIS — H524 Presbyopia: Secondary | ICD-10-CM | POA: Diagnosis not present

## 2023-01-09 ENCOUNTER — Ambulatory Visit: Payer: PPO | Admitting: Urology

## 2023-01-09 ENCOUNTER — Ambulatory Visit: Payer: PPO | Admitting: General Practice

## 2023-01-10 DIAGNOSIS — M9901 Segmental and somatic dysfunction of cervical region: Secondary | ICD-10-CM | POA: Diagnosis not present

## 2023-01-10 DIAGNOSIS — S233XXA Sprain of ligaments of thoracic spine, initial encounter: Secondary | ICD-10-CM | POA: Diagnosis not present

## 2023-01-10 DIAGNOSIS — M9902 Segmental and somatic dysfunction of thoracic region: Secondary | ICD-10-CM | POA: Diagnosis not present

## 2023-01-10 DIAGNOSIS — M47816 Spondylosis without myelopathy or radiculopathy, lumbar region: Secondary | ICD-10-CM | POA: Diagnosis not present

## 2023-01-10 DIAGNOSIS — M9903 Segmental and somatic dysfunction of lumbar region: Secondary | ICD-10-CM | POA: Diagnosis not present

## 2023-01-10 DIAGNOSIS — S134XXA Sprain of ligaments of cervical spine, initial encounter: Secondary | ICD-10-CM | POA: Diagnosis not present

## 2023-01-30 DIAGNOSIS — E113412 Type 2 diabetes mellitus with severe nonproliferative diabetic retinopathy with macular edema, left eye: Secondary | ICD-10-CM | POA: Diagnosis not present

## 2023-01-30 DIAGNOSIS — E113411 Type 2 diabetes mellitus with severe nonproliferative diabetic retinopathy with macular edema, right eye: Secondary | ICD-10-CM | POA: Diagnosis not present

## 2023-02-06 DIAGNOSIS — M47816 Spondylosis without myelopathy or radiculopathy, lumbar region: Secondary | ICD-10-CM | POA: Diagnosis not present

## 2023-02-06 DIAGNOSIS — S134XXA Sprain of ligaments of cervical spine, initial encounter: Secondary | ICD-10-CM | POA: Diagnosis not present

## 2023-02-06 DIAGNOSIS — S233XXA Sprain of ligaments of thoracic spine, initial encounter: Secondary | ICD-10-CM | POA: Diagnosis not present

## 2023-02-06 DIAGNOSIS — M9901 Segmental and somatic dysfunction of cervical region: Secondary | ICD-10-CM | POA: Diagnosis not present

## 2023-02-06 DIAGNOSIS — M9902 Segmental and somatic dysfunction of thoracic region: Secondary | ICD-10-CM | POA: Diagnosis not present

## 2023-02-06 DIAGNOSIS — M9903 Segmental and somatic dysfunction of lumbar region: Secondary | ICD-10-CM | POA: Diagnosis not present

## 2023-02-21 ENCOUNTER — Telehealth: Payer: Self-pay | Admitting: Cardiology

## 2023-02-21 DIAGNOSIS — M9901 Segmental and somatic dysfunction of cervical region: Secondary | ICD-10-CM | POA: Diagnosis not present

## 2023-02-21 DIAGNOSIS — S134XXA Sprain of ligaments of cervical spine, initial encounter: Secondary | ICD-10-CM | POA: Diagnosis not present

## 2023-02-21 DIAGNOSIS — Z9884 Bariatric surgery status: Secondary | ICD-10-CM | POA: Diagnosis not present

## 2023-02-21 DIAGNOSIS — K810 Acute cholecystitis: Secondary | ICD-10-CM | POA: Diagnosis not present

## 2023-02-21 DIAGNOSIS — Z6830 Body mass index (BMI) 30.0-30.9, adult: Secondary | ICD-10-CM | POA: Diagnosis not present

## 2023-02-21 DIAGNOSIS — M47816 Spondylosis without myelopathy or radiculopathy, lumbar region: Secondary | ICD-10-CM | POA: Diagnosis not present

## 2023-02-21 DIAGNOSIS — R5383 Other fatigue: Secondary | ICD-10-CM | POA: Diagnosis not present

## 2023-02-21 DIAGNOSIS — M9902 Segmental and somatic dysfunction of thoracic region: Secondary | ICD-10-CM | POA: Diagnosis not present

## 2023-02-21 DIAGNOSIS — S233XXA Sprain of ligaments of thoracic spine, initial encounter: Secondary | ICD-10-CM | POA: Diagnosis not present

## 2023-02-21 DIAGNOSIS — E1143 Type 2 diabetes mellitus with diabetic autonomic (poly)neuropathy: Secondary | ICD-10-CM | POA: Diagnosis not present

## 2023-02-21 DIAGNOSIS — M9903 Segmental and somatic dysfunction of lumbar region: Secondary | ICD-10-CM | POA: Diagnosis not present

## 2023-02-21 DIAGNOSIS — R030 Elevated blood-pressure reading, without diagnosis of hypertension: Secondary | ICD-10-CM | POA: Diagnosis not present

## 2023-02-21 DIAGNOSIS — R600 Localized edema: Secondary | ICD-10-CM | POA: Diagnosis not present

## 2023-02-21 NOTE — Telephone Encounter (Signed)
Hypertension after activity, calling to discuss something with the Dr. Wyline Mood. Please advise

## 2023-02-25 DIAGNOSIS — N181 Chronic kidney disease, stage 1: Secondary | ICD-10-CM | POA: Diagnosis not present

## 2023-02-25 DIAGNOSIS — E113313 Type 2 diabetes mellitus with moderate nonproliferative diabetic retinopathy with macular edema, bilateral: Secondary | ICD-10-CM | POA: Diagnosis not present

## 2023-02-25 DIAGNOSIS — I129 Hypertensive chronic kidney disease with stage 1 through stage 4 chronic kidney disease, or unspecified chronic kidney disease: Secondary | ICD-10-CM | POA: Diagnosis not present

## 2023-02-25 DIAGNOSIS — K760 Fatty (change of) liver, not elsewhere classified: Secondary | ICD-10-CM | POA: Diagnosis not present

## 2023-02-25 DIAGNOSIS — E1169 Type 2 diabetes mellitus with other specified complication: Secondary | ICD-10-CM | POA: Diagnosis not present

## 2023-02-25 DIAGNOSIS — R790 Abnormal level of blood mineral: Secondary | ICD-10-CM | POA: Diagnosis not present

## 2023-02-25 DIAGNOSIS — E785 Hyperlipidemia, unspecified: Secondary | ICD-10-CM | POA: Diagnosis not present

## 2023-02-25 DIAGNOSIS — E1129 Type 2 diabetes mellitus with other diabetic kidney complication: Secondary | ICD-10-CM | POA: Diagnosis not present

## 2023-02-25 DIAGNOSIS — E1142 Type 2 diabetes mellitus with diabetic polyneuropathy: Secondary | ICD-10-CM | POA: Diagnosis not present

## 2023-02-25 DIAGNOSIS — E559 Vitamin D deficiency, unspecified: Secondary | ICD-10-CM | POA: Diagnosis not present

## 2023-02-25 DIAGNOSIS — E1122 Type 2 diabetes mellitus with diabetic chronic kidney disease: Secondary | ICD-10-CM | POA: Diagnosis not present

## 2023-02-25 DIAGNOSIS — E1159 Type 2 diabetes mellitus with other circulatory complications: Secondary | ICD-10-CM | POA: Diagnosis not present

## 2023-02-25 DIAGNOSIS — D508 Other iron deficiency anemias: Secondary | ICD-10-CM | POA: Diagnosis not present

## 2023-02-25 DIAGNOSIS — Z794 Long term (current) use of insulin: Secondary | ICD-10-CM | POA: Diagnosis not present

## 2023-02-25 DIAGNOSIS — R809 Proteinuria, unspecified: Secondary | ICD-10-CM | POA: Diagnosis not present

## 2023-02-25 NOTE — Telephone Encounter (Signed)
Can we get more information on this, specifically what the details are of "hypertension after activity".  Blood pressure should normally go up with activity, typically for any change in treatment would need to be sitting still for at least 5 minutes with an elevated blood pressure at that point. Depending on the activity common for blood pressure to go to 160s or 170s with activity.   Dominga Ferry MD

## 2023-02-26 NOTE — Telephone Encounter (Signed)
Spoke with patient in regards to BP fluctuating up & down.  Patient states he was having BP's as low as 96/53 up to the 145's.  Had GB surgery 12/02/2022 and has had issues since then.  States that he stopped his Ramipril & Metoprolol under the guidance of Dr. Leandrew Koyanagi.  Patient has had multiple issues recently and Dr. Leandrew Koyanagi wanted to make sure they were on the right course of treatment.  Appointment scheduled with NP as too complicated to address over there phone.    Advised to continue to keep log to bring to visit on Friday.

## 2023-02-27 DIAGNOSIS — G4710 Hypersomnia, unspecified: Secondary | ICD-10-CM | POA: Diagnosis not present

## 2023-03-01 ENCOUNTER — Telehealth: Payer: Self-pay | Admitting: Nurse Practitioner

## 2023-03-01 ENCOUNTER — Encounter: Payer: Self-pay | Admitting: Nurse Practitioner

## 2023-03-01 ENCOUNTER — Ambulatory Visit: Payer: PPO | Attending: Nurse Practitioner | Admitting: Nurse Practitioner

## 2023-03-01 ENCOUNTER — Ambulatory Visit: Payer: PPO

## 2023-03-01 ENCOUNTER — Other Ambulatory Visit: Payer: Self-pay | Admitting: Nurse Practitioner

## 2023-03-01 VITALS — BP 132/84 | HR 96 | Ht 71.0 in | Wt 223.2 lb

## 2023-03-01 DIAGNOSIS — D649 Anemia, unspecified: Secondary | ICD-10-CM

## 2023-03-01 DIAGNOSIS — R531 Weakness: Secondary | ICD-10-CM

## 2023-03-01 DIAGNOSIS — E785 Hyperlipidemia, unspecified: Secondary | ICD-10-CM | POA: Diagnosis not present

## 2023-03-01 DIAGNOSIS — I2510 Atherosclerotic heart disease of native coronary artery without angina pectoris: Secondary | ICD-10-CM

## 2023-03-01 DIAGNOSIS — I10 Essential (primary) hypertension: Secondary | ICD-10-CM | POA: Diagnosis not present

## 2023-03-01 NOTE — Telephone Encounter (Signed)
Checking percert on the following   3 Day ZAIO XT

## 2023-03-01 NOTE — Progress Notes (Signed)
Cardiology Office Note:  .   Date:  03/01/2023  ID:  [name], DOB Jun 25, 1955, MRN [id number] PCP: Juliette Alcide, MD  [facility] Providers Cardiologist:  Dina Rich, MD    History of Present Illness: .   [name] is a 67 y.o. male with a PMH of CAD, hypertension, type 2 diabetes, hyperlipidemia, obesity, who presents today for blood pressure evaluation.  Last seen by Edd Fabian, NP on November 28, 2022 for chest discomfort and lower extremity swelling evaluation.  Patient reported developing body aches, fatigue, lower extremity swelling, loss of appetite after receiving the shingles vaccine.  Patient received antibiotic treatment.  He noted his leg swelling improved.  Echocardiogram was arranged and revealed normal EF, no significant valvular abnormalities, mildly dilated aortic root.  Today he presents for blood pressure evaluation.  He spoke with nursing staff reporting BP fluctuating in states... Patient stated he stopped ramipril and metoprolol per recommendation of his PCP.  He presents for follow-up today.  He states he underwent gall bladder surgery at [facility] earlier this year. Since recovering from this surgery and returning to working out, he reports recent episodes of BP dropping and fluctuating. He is unable to tell me average BP readings. Says he has had to hold off on working out as he has episodes of feeling weak/feeling flushed when his BP drops, says he feels as though his equilibrium is off when this happens. Denies any chest pain, shortness of breath, palpitations, syncope, presyncope, dizziness, orthopnea, PND, swelling or significant weight changes, acute bleeding, or claudication. Recent BP log shows normal readings.  Reports he had gastric bypass surgery around 4 years ago.   ROS: Negative. See HPI.   Studies Reviewed: .    Echo 12/2022:  1. Left ventricular ejection fraction, by estimation, is 60 to 65%. The  left ventricle has normal  function. The left ventricle has no regional  wall motion abnormalities. Left ventricular diastolic parameters were  normal.   2. Right ventricular systolic function is mildly reduced. The right  ventricular size is mildly enlarged.   3. Left atrial size was mildly dilated.   4. The mitral valve is normal in structure. No evidence of mitral valve  regurgitation. No evidence of mitral stenosis.   5. The aortic valve is tricuspid. There is mild calcification of the  aortic valve. Aortic valve regurgitation is not visualized. Aortic valve  sclerosis is present, with no evidence of aortic valve stenosis.   6. Aortic dilatation noted. There is mild dilatation of the aortic root,  measuring 39 mm.   7. The inferior vena cava is normal in size with greater than 50%  respiratory variability, suggesting right atrial pressure of 3 mmHg.  Lexiscan 08/2015: Blood pressure demonstrated a hypertensive response to exercise. There was no ST segment deviation noted during stress. Defect 1: There is a small defect of mild severity present in the mid inferior, mid inferolateral and apical inferior location. This is consistent with soft tissue attenuation artifact. No areas of ischemia or infarction. This is a low risk study. Nuclear stress EF: 68%.  Physical Exam:   VS:  BP 132/84   Pulse 96   Ht 5\' 11"  (1.803 m)   Wt 223 lb 3.2 oz (101.2 kg)   SpO2 96%   BMI 31.13 kg/m    Wt Readings from Last 3 Encounters:  03/01/23 223 lb 3.2 oz (101.2 kg)  11/28/22 213 lb (96.6 kg)  03/19/22 225 lb 9.6 oz (  102.3 kg)    GEN: Obese, 67 y.o. male in no acute distress NECK: No JVD; No carotid bruits CARDIAC: S1/S2, RRR, no murmurs, rubs, gallops RESPIRATORY:  Clear to auscultation without rales, wheezing or rhonchi  ABDOMEN: Soft, non-tender, non-distended EXTREMITIES:  No edema; No deformity   ASSESSMENT AND PLAN: .    CAD Denies any chest pains. Stable with no anginal symptoms. No indication for ischemic  evaluation. Continue Aspirin and rosuvastatin. Continue to hold Toprol XL and ramipril. Heart healthy diet encouraged. Care and ED precautions discussed.   HTN BP stable in office today. Orthostatics negative today in office. Past cardiac workup benign. Will arrange 3 day Zio XT monitor for weakness as mentioned below. No medication changes at this time. Given BP log. Discussed to monitor BP at home at least 2 hours after medications and sitting for 5-10 minutes.   HLD Recent LDL 23. Continue current medication regimen. Continue to follow with PCP.  4. Anemia Recent labs revealed low Hemoglobin and Hematocrit. Denies any bleeding issues. Recommend PCP checks an iron panel for further evaluation. Continue to follow with PCP and care team.  5. Weakness Occurs when BP drops, orthostatics negative. Arranging 3 day Zio monitor for further evaluation. Care and ED precautions discussed. No medication changes at this time.    Dispo: Follow-up with me/APP in 6-8 weeks or sooner if anything changes.   Signed, Sharlene Dory, NP

## 2023-03-01 NOTE — Patient Instructions (Addendum)
Medication Instructions:  Your physician recommends that you continue on your current medications as directed. Please refer to the Current Medication list given to you today.  Labwork: None   Testing/Procedures: Your physician has recommended that you wear a Zio monitor.   This monitor is a medical device that records the heart's electrical activity. Doctors most often use these monitors to diagnose arrhythmias. Arrhythmias are problems with the speed or rhythm of the heartbeat. The monitor is a small device applied to your chest. You can wear one while you do your normal daily activities. While wearing this monitor if you have any symptoms to push the button and record what you felt. Once you have worn this monitor for the period of time provider prescribed (for 3 days), you will return the monitor device in the postage paid box. Once it is returned they will download the data collected and provide [place] with a report which the provider will then review and we will call you with those results. Important tips:  Avoid showering during the first 24 hours of wearing the monitor. Avoid excessive sweating to help maximize wear time. Do not submerge the device, no hot tubs, and no swimming pools. Keep any lotions or oils away from the patch. After 24 hours you may shower with the patch on. Take brief showers with your back facing the shower head.  Do not remove patch once it has been placed because that will interrupt data and decrease adhesive wear time. Push the button when you have any symptoms and write down what you were feeling. Once you have completed wearing your monitor, remove and place into box which has postage paid and place in your outgoing mailbox.  If for some reason you have misplaced your box then call our office and we can provide another box and/or mail it off for you.  Follow-Up: Your physician recommends that you schedule a follow-up appointment in: 6-8 weeks Philis Nettle   Any Other  Special Instructions Will Be Listed Below (If Applicable).  If you need a refill on your cardiac medications before your next appointment, please call your pharmacy.

## 2023-03-08 DIAGNOSIS — M9901 Segmental and somatic dysfunction of cervical region: Secondary | ICD-10-CM | POA: Diagnosis not present

## 2023-03-08 DIAGNOSIS — S233XXA Sprain of ligaments of thoracic spine, initial encounter: Secondary | ICD-10-CM | POA: Diagnosis not present

## 2023-03-08 DIAGNOSIS — S134XXA Sprain of ligaments of cervical spine, initial encounter: Secondary | ICD-10-CM | POA: Diagnosis not present

## 2023-03-08 DIAGNOSIS — M9903 Segmental and somatic dysfunction of lumbar region: Secondary | ICD-10-CM | POA: Diagnosis not present

## 2023-03-08 DIAGNOSIS — M47816 Spondylosis without myelopathy or radiculopathy, lumbar region: Secondary | ICD-10-CM | POA: Diagnosis not present

## 2023-03-08 DIAGNOSIS — M9902 Segmental and somatic dysfunction of thoracic region: Secondary | ICD-10-CM | POA: Diagnosis not present

## 2023-03-11 DIAGNOSIS — R531 Weakness: Secondary | ICD-10-CM | POA: Diagnosis not present

## 2023-03-12 DIAGNOSIS — E113412 Type 2 diabetes mellitus with severe nonproliferative diabetic retinopathy with macular edema, left eye: Secondary | ICD-10-CM | POA: Diagnosis not present

## 2023-03-12 DIAGNOSIS — E113411 Type 2 diabetes mellitus with severe nonproliferative diabetic retinopathy with macular edema, right eye: Secondary | ICD-10-CM | POA: Diagnosis not present

## 2023-03-21 DIAGNOSIS — M9902 Segmental and somatic dysfunction of thoracic region: Secondary | ICD-10-CM | POA: Diagnosis not present

## 2023-03-21 DIAGNOSIS — S134XXA Sprain of ligaments of cervical spine, initial encounter: Secondary | ICD-10-CM | POA: Diagnosis not present

## 2023-03-21 DIAGNOSIS — M47816 Spondylosis without myelopathy or radiculopathy, lumbar region: Secondary | ICD-10-CM | POA: Diagnosis not present

## 2023-03-21 DIAGNOSIS — M9903 Segmental and somatic dysfunction of lumbar region: Secondary | ICD-10-CM | POA: Diagnosis not present

## 2023-03-21 DIAGNOSIS — S233XXA Sprain of ligaments of thoracic spine, initial encounter: Secondary | ICD-10-CM | POA: Diagnosis not present

## 2023-03-21 DIAGNOSIS — M9901 Segmental and somatic dysfunction of cervical region: Secondary | ICD-10-CM | POA: Diagnosis not present

## 2023-03-28 DIAGNOSIS — M9902 Segmental and somatic dysfunction of thoracic region: Secondary | ICD-10-CM | POA: Diagnosis not present

## 2023-03-28 DIAGNOSIS — S134XXA Sprain of ligaments of cervical spine, initial encounter: Secondary | ICD-10-CM | POA: Diagnosis not present

## 2023-03-28 DIAGNOSIS — M9903 Segmental and somatic dysfunction of lumbar region: Secondary | ICD-10-CM | POA: Diagnosis not present

## 2023-03-28 DIAGNOSIS — M9901 Segmental and somatic dysfunction of cervical region: Secondary | ICD-10-CM | POA: Diagnosis not present

## 2023-03-28 DIAGNOSIS — S233XXA Sprain of ligaments of thoracic spine, initial encounter: Secondary | ICD-10-CM | POA: Diagnosis not present

## 2023-03-28 DIAGNOSIS — M47816 Spondylosis without myelopathy or radiculopathy, lumbar region: Secondary | ICD-10-CM | POA: Diagnosis not present

## 2023-03-29 ENCOUNTER — Ambulatory Visit (INDEPENDENT_AMBULATORY_CARE_PROVIDER_SITE_OTHER): Payer: PPO | Admitting: Urology

## 2023-03-29 VITALS — BP 106/69 | HR 80

## 2023-03-29 DIAGNOSIS — N138 Other obstructive and reflux uropathy: Secondary | ICD-10-CM

## 2023-03-29 DIAGNOSIS — N401 Enlarged prostate with lower urinary tract symptoms: Secondary | ICD-10-CM | POA: Diagnosis not present

## 2023-03-29 DIAGNOSIS — R339 Retention of urine, unspecified: Secondary | ICD-10-CM | POA: Diagnosis not present

## 2023-03-29 LAB — URINALYSIS, ROUTINE W REFLEX MICROSCOPIC
Bilirubin, UA: NEGATIVE
Ketones, UA: NEGATIVE
Leukocytes,UA: NEGATIVE
Nitrite, UA: NEGATIVE
Protein,UA: NEGATIVE
RBC, UA: NEGATIVE
Specific Gravity, UA: 1.01 (ref 1.005–1.030)
Urobilinogen, Ur: 0.2 mg/dL (ref 0.2–1.0)
pH, UA: 6 (ref 5.0–7.5)

## 2023-03-29 LAB — BLADDER SCAN AMB NON-IMAGING: Scan Result: 311

## 2023-03-29 MED ORDER — SILODOSIN 8 MG PO CAPS: 8.0000 mg | ORAL_CAPSULE | Freq: Every evening | ORAL | 11 refills | Status: DC

## 2023-03-29 NOTE — Progress Notes (Signed)
03/29/2023 12:54 PM   [name] 1955/07/18 [id number]  Referring provider: Juliette Alcide, MD [address] [place],  [place] [address]  Followup incomplete emptying   HPI: [name] is a 27BM here for followup for BPH with incomplete emptying. IPSS 2 QOL 1 on uroxatral 10mg  at bedtime. Nocturia 2x. PVR 311. Urine stream strong.    PMH: Past Medical History:  Diagnosis Date   Arthritis    generalized   Coronary artery disease    stents placed 2003   Hyperlipidemia    Hypertension    Sleep apnea    on CPAP    Surgical History: Past Surgical History:  Procedure Laterality Date   BARIATRIC SURGERY  12/2015   BICEPS TENDON REPAIR     CARDIAC CATHETERIZATION  2000   DRUG INDUCED ENDOSCOPY N/A 08/26/2020   Procedure: DRUG INDUCED ENDOSCOPY;  Surgeon: Osborn Coho, MD;  Location: [facility];  Service: ENT;  Laterality: N/A;   IMPLANTATION OF HYPOGLOSSAL NERVE STIMULATOR Right 01/25/2021   Procedure: IMPLANTATION OF HYPOGLOSSAL NERVE STIMULATOR;  Surgeon: Osborn Coho, MD;  Location: [facility] OR;  Service: ENT;  Laterality: Right;   KNEE ARTHROSCOPY Bilateral    septorhinoplasty     SHOULDER ARTHROSCOPY Bilateral    TONSILLECTOMY     UVULLA REMOVAL      Home Medications:  Allergies as of 03/29/2023   No Known Allergies      Medication List        Accurate as of March 29, 2023 12:54 PM. If you have any questions, ask your nurse or doctor.          alfuzosin 10 MG 24 hr tablet Commonly known as: UROXATRAL Take 1 tablet (10 mg total) by mouth at bedtime.   aspirin 81 MG tablet Take 81 mg by mouth daily.   b complex vitamins tablet Take 1 tablet by mouth daily.   Calcium 500-100 MG-UNIT Chew Chew 1 tablet by mouth 4 (four) times daily.   cyanocobalamin 1000 MCG tablet Commonly known as: VITAMIN B12 Take 1,000 mcg by mouth daily.   ferrous sulfate 325 (65 FE) MG EC tablet Take 325 mg by mouth daily.   icosapent Ethyl 1 g  capsule Commonly known as: VASCEPA Take 1 g by mouth 2 (two) times daily at 10 AM and 5 PM.   Jardiance 25 MG Tabs tablet Generic drug: empagliflozin Take 12.5 mg by mouth daily.   Magnesium 250 MG Tabs Take 250 mg by mouth daily.   metFORMIN 850 MG tablet Commonly known as: GLUCOPHAGE Take 850 mg by mouth 2 (two) times daily with a meal.   metoprolol succinate 25 MG 24 hr tablet Commonly known as: TOPROL-XL TAKE 1 TABLET BY MOUTH  DAILY   multivitamin tablet Take 1 tablet by mouth 2 (two) times daily.   OMEGA-3 FISH OIL PO Take 1 tablet by mouth daily.   pantoprazole 40 MG tablet Commonly known as: PROTONIX Take 40 mg by mouth daily.   potassium chloride SA 20 MEQ tablet Commonly known as: KLOR-CON M Take 20 mEq by mouth 2 (two) times daily.   ramipril 2.5 MG capsule Commonly known as: ALTACE TAKE 1 CAPSULE BY MOUTH  DAILY   rosuvastatin 20 MG tablet Commonly known as: CRESTOR Take 20 mg by mouth daily.   SYSTANE OP Place 1 drop into both eyes daily as needed (dry eyes).   Vitamin D 50 MCG (2000 UT) tablet Take 6,000 Units by mouth daily.  Allergies: No Known Allergies  Family History: Family History  Problem Relation Age of Onset   Diabetes Mother    Diabetes Father    Heart attack Father     Social History:  reports that he has never smoked. He has never used smokeless tobacco. He reports that he does not currently use alcohol. He reports that he does not use drugs.  ROS: All other review of systems were reviewed and are negative except what is noted above in HPI  Physical Exam: BP 106/69   Pulse 80   Constitutional:  Alert and oriented, No acute distress. HEENT: [place] AT, moist mucus membranes.  Trachea midline, no masses. Cardiovascular: No clubbing, cyanosis, or edema. Respiratory: Normal respiratory effort, no increased work of breathing. GI: Abdomen is soft, nontender, nondistended, no abdominal masses GU: No CVA tenderness.   Lymph: No cervical or inguinal lymphadenopathy. Skin: No rashes, bruises or suspicious lesions. Neurologic: Grossly intact, no focal deficits, moving all 4 extremities. Psychiatric: Normal mood and affect.  Laboratory Data: Lab Results  Component Value Date   WBC 8.5 01/18/2021   HGB 12.0 (L) 01/18/2021   HCT 37.8 (L) 01/18/2021   MCV 90.9 01/18/2021   PLT 159 01/18/2021    Lab Results  Component Value Date   CREATININE 0.83 01/18/2021    No results found for: "PSA"  No results found for: "TESTOSTERONE"  Lab Results  Component Value Date   HGBA1C (H) 02/19/2007    8.5 (NOTE)   The ADA recommends the following therapeutic goals for glycemic   control related to Hgb A1C measurement:   Goal of Therapy:   < 7.0% Hgb A1C   Action Suggested:  > 8.0% Hgb A1C   Ref:  Diabetes Care, 22, Suppl. 1, 1999    Urinalysis    Component Value Date/Time   APPEARANCEUR Clear 09/12/2022 1329   GLUCOSEU 3+ (A) 09/12/2022 1329   BILIRUBINUR Negative 09/12/2022 1329   PROTEINUR Negative 09/12/2022 1329   NITRITE Negative 09/12/2022 1329   LEUKOCYTESUR Negative 09/12/2022 1329    Lab Results  Component Value Date   LABMICR Comment 09/12/2022   WBCUA 0-5 07/18/2021   LABEPIT 0-10 07/18/2021   BACTERIA Few (A) 07/18/2021    Pertinent Imaging: *** No results found for this or any previous visit.  No results found for this or any previous visit.  No results found for this or any previous visit.  No results found for this or any previous visit.  No results found for this or any previous visit.  No results found for this or any previous visit.  No results found for this or any previous visit.  No results found for this or any previous visit.   Assessment & Plan:    1. BPH with obstruction/lower urinary tract symptoms (Primary) Rapaflo 8mg  qhs - Urinalysis, Routine w reflex microscopic - BLADDER SCAN AMB NON-IMAGING  2. Incomplete bladder emptying We will trial rapaflo  8mg  at bedtime -followup 3 months with PVR   No follow-ups on file.  Wilkie Aye, MD  [facility]

## 2023-03-29 NOTE — Progress Notes (Signed)
post void residual= 311

## 2023-04-08 ENCOUNTER — Encounter: Payer: Self-pay | Admitting: Nurse Practitioner

## 2023-04-08 ENCOUNTER — Ambulatory Visit: Payer: PPO | Attending: Nurse Practitioner | Admitting: Nurse Practitioner

## 2023-04-08 ENCOUNTER — Encounter: Payer: Self-pay | Admitting: Urology

## 2023-04-08 VITALS — BP 120/66 | HR 76 | Ht 71.0 in | Wt 222.2 lb

## 2023-04-08 DIAGNOSIS — I2510 Atherosclerotic heart disease of native coronary artery without angina pectoris: Secondary | ICD-10-CM

## 2023-04-08 DIAGNOSIS — I10 Essential (primary) hypertension: Secondary | ICD-10-CM

## 2023-04-08 DIAGNOSIS — D649 Anemia, unspecified: Secondary | ICD-10-CM | POA: Diagnosis not present

## 2023-04-08 DIAGNOSIS — R42 Dizziness and giddiness: Secondary | ICD-10-CM

## 2023-04-08 DIAGNOSIS — R232 Flushing: Secondary | ICD-10-CM | POA: Diagnosis not present

## 2023-04-08 DIAGNOSIS — E785 Hyperlipidemia, unspecified: Secondary | ICD-10-CM | POA: Diagnosis not present

## 2023-04-08 NOTE — Patient Instructions (Signed)

## 2023-04-08 NOTE — Patient Instructions (Addendum)
Medication Instructions:  Your physician recommends that you continue on your current medications as directed. Please refer to the Current Medication list given to you today.  Labwork: None   Testing/Procedures: Your physician has requested that you have a carotid duplex. This test is an ultrasound of the carotid arteries in your neck. It looks at blood flow through these arteries that supply the brain with blood. Allow one hour for this exam. There are no restrictions or special instructions.  Follow-Up: Your physician recommends that you schedule a follow-up appointment in: 8 weeks   Any Other Special Instructions Will Be Listed Below (If Applicable).  If you need a refill on your cardiac medications before your next appointment, please call your pharmacy.

## 2023-04-08 NOTE — Progress Notes (Signed)
Cardiology Office Note:  .   Date:  04/08/2023  ID:  [name], DOB 02/12/1956, MRN [id number] PCP: Juliette Alcide, MD  [facility] Providers Cardiologist:  Dina Rich, MD    History of Present Illness: .   [name] is a 68 y.o. male with a PMH of CAD, hypertension, type 2 diabetes, hyperlipidemia, obesity, who presents today for follow-up.   Last seen by Edd Fabian, NP on November 28, 2022 for chest discomfort and lower extremity swelling evaluation.  Patient reported developing body aches, fatigue, lower extremity swelling, loss of appetite after receiving the shingles vaccine.  Patient received antibiotic treatment.  He noted his leg swelling improved.  Echocardiogram was arranged and revealed normal EF, no significant valvular abnormalities, mildly dilated aortic root.  I last saw him for follow-up on March 01, 2023. Stated he underwent gall bladder surgery at [facility] earlier this year. Since recovering from this surgery and returning to working out, he reported episodes of BP dropping and fluctuating. Had to hold off on working out as he has episodes of feeling weak/feeling flushed when his BP drops. Denied any chest pain, shortness of breath, palpitations, syncope, presyncope, dizziness, orthopnea, PND, swelling or significant weight changes, acute bleeding, or claudication. Recent BP log shows normal readings.  Reported he had gastric bypass surgery around 4 years ago.   Today presents for follow-up.  He says he had another episode of feeling "flushed" 1 to 2 days after office visit.  Says he has not been working out and holding off on this to prevent recurrent episodes. Denies any chest pain, shortness of breath, palpitations, syncope, presyncope, dizziness, orthopnea, PND, swelling or significant weight changes, acute bleeding, or claudication. Does have episodes of feeling lightheaded, however he says overall he is doing well since he is not working out.  Says  he is still no longer taking ramipril or metoprolol.  ROS: Negative. See HPI.   Studies Reviewed: .    Cardiac monitor (preliminary report) 02/2023: Patch Wear Time:  4 days and 6 hours (2024-12-20T09:52:52-0500 to 2024-12-24T15:56:53-0500)   Patient had a min HR of 45 bpm, max HR of 145 bpm, and avg HR of 87 bpm. Predominant underlying rhythm was Sinus Rhythm. Slight P wave morphology changes were noted. 1 run of Ventricular Tachycardia occurred lasting 5 beats with a max rate of 140 bpm  (avg 106 bpm). 4 Supraventricular Tachycardia runs occurred, the run with the fastest interval lasting 4 beats with a max rate of 143 bpm, the longest lasting 21.1 secs with an avg rate of 135 bpm. Second Degree AV Block-Mobitz I (Wenckebach) was  present. Isolated SVEs were occasional (1.1%, 4331), SVE Couplets were rare (<1.0%, 87), and SVE Triplets were rare (<1.0%, 88). Isolated VEs were rare (<1.0%), VE Couplets were rare (<1.0%), and no VE Triplets were present. Ventricular Bigeminy was  present.  Echo 12/2022:  1. Left ventricular ejection fraction, by estimation, is 60 to 65%. The  left ventricle has normal function. The left ventricle has no regional  wall motion abnormalities. Left ventricular diastolic parameters were  normal.   2. Right ventricular systolic function is mildly reduced. The right  ventricular size is mildly enlarged.   3. Left atrial size was mildly dilated.   4. The mitral valve is normal in structure. No evidence of mitral valve  regurgitation. No evidence of mitral stenosis.   5. The aortic valve is tricuspid. There is mild calcification of the  aortic valve. Aortic valve  regurgitation is not visualized. Aortic valve  sclerosis is present, with no evidence of aortic valve stenosis.   6. Aortic dilatation noted. There is mild dilatation of the aortic root,  measuring 39 mm.   7. The inferior vena cava is normal in size with greater than 50%  respiratory variability,  suggesting right atrial pressure of 3 mmHg.  Lexiscan 08/2015: Blood pressure demonstrated a hypertensive response to exercise. There was no ST segment deviation noted during stress. Defect 1: There is a small defect of mild severity present in the mid inferior, mid inferolateral and apical inferior location. This is consistent with soft tissue attenuation artifact. No areas of ischemia or infarction. This is a low risk study. Nuclear stress EF: 68%.  Physical Exam:   VS:  BP 120/66   Pulse 76   Ht 5\' 11"  (1.803 m)   Wt 222 lb 3.2 oz (100.8 kg)   SpO2 99%   BMI 30.99 kg/m    Wt Readings from Last 3 Encounters:  04/08/23 222 lb 3.2 oz (100.8 kg)  03/01/23 223 lb 3.2 oz (101.2 kg)  11/28/22 213 lb (96.6 kg)    GEN: Obese, 68 y.o. male in no acute distress NECK: No JVD; No carotid bruits CARDIAC: S1/S2, RRR, no murmurs, rubs, gallops RESPIRATORY:  Clear to auscultation without rales, wheezing or rhonchi  ABDOMEN: Soft, non-tender, non-distended EXTREMITIES:  No edema; No deformity   ASSESSMENT AND PLAN: .    CAD Denies any chest pains. Stable with no anginal symptoms. No indication for ischemic evaluation. Continue Aspirin and rosuvastatin. Continue to hold Toprol XL and ramipril. Heart healthy diet encouraged. Care and ED precautions discussed.   HTN BP stable in office today. Previous orthostatics negative today in office. Past cardiac workup benign. No medication changes at this time. Given BP log and instructed to monitor BP during episodes. Discussed to monitor BP at home at least 2 hours after medications and sitting for 5-10 minutes.   HLD Past LDL 23. Continue current medication regimen. Continue to follow with PCP.  4. Anemia Past labs revealed low Hemoglobin and Hematocrit. Denies any bleeding issues.  Recent iron level normal. Continue to follow with PCP and care team.  5. Lightheadedness, flushing Etiology unclear, however does appear his blood pressure dropping  seems to be a component.  Past orthostatics are negative.  Will arrange carotid Doppler for further evaluation.  Last echo in October 2024 was negative for any structural abnormalities.  See monitor report noted above. Denies any palpitations/tachycardia.  Conservative measures discussed.  Care and ED precautions discussed. No medication changes at this time. Will route note to attending cardiologist for further recs.    Dispo: Follow-up with me/APP in 8 weeks or sooner if anything changes.   Signed, Sharlene Dory, NP

## 2023-04-11 DIAGNOSIS — E782 Mixed hyperlipidemia: Secondary | ICD-10-CM | POA: Diagnosis not present

## 2023-04-11 DIAGNOSIS — E876 Hypokalemia: Secondary | ICD-10-CM | POA: Diagnosis not present

## 2023-04-11 DIAGNOSIS — S233XXA Sprain of ligaments of thoracic spine, initial encounter: Secondary | ICD-10-CM | POA: Diagnosis not present

## 2023-04-11 DIAGNOSIS — S134XXA Sprain of ligaments of cervical spine, initial encounter: Secondary | ICD-10-CM | POA: Diagnosis not present

## 2023-04-11 DIAGNOSIS — E113313 Type 2 diabetes mellitus with moderate nonproliferative diabetic retinopathy with macular edema, bilateral: Secondary | ICD-10-CM | POA: Diagnosis not present

## 2023-04-11 DIAGNOSIS — I10 Essential (primary) hypertension: Secondary | ICD-10-CM | POA: Diagnosis not present

## 2023-04-11 DIAGNOSIS — M9902 Segmental and somatic dysfunction of thoracic region: Secondary | ICD-10-CM | POA: Diagnosis not present

## 2023-04-11 DIAGNOSIS — M9901 Segmental and somatic dysfunction of cervical region: Secondary | ICD-10-CM | POA: Diagnosis not present

## 2023-04-11 DIAGNOSIS — M9903 Segmental and somatic dysfunction of lumbar region: Secondary | ICD-10-CM | POA: Diagnosis not present

## 2023-04-11 DIAGNOSIS — Z1329 Encounter for screening for other suspected endocrine disorder: Secondary | ICD-10-CM | POA: Diagnosis not present

## 2023-04-11 DIAGNOSIS — D649 Anemia, unspecified: Secondary | ICD-10-CM | POA: Diagnosis not present

## 2023-04-11 DIAGNOSIS — E7849 Other hyperlipidemia: Secondary | ICD-10-CM | POA: Diagnosis not present

## 2023-04-11 DIAGNOSIS — M47816 Spondylosis without myelopathy or radiculopathy, lumbar region: Secondary | ICD-10-CM | POA: Diagnosis not present

## 2023-04-18 DIAGNOSIS — I10 Essential (primary) hypertension: Secondary | ICD-10-CM | POA: Diagnosis not present

## 2023-04-18 DIAGNOSIS — Z0001 Encounter for general adult medical examination with abnormal findings: Secondary | ICD-10-CM | POA: Diagnosis not present

## 2023-04-18 DIAGNOSIS — G4733 Obstructive sleep apnea (adult) (pediatric): Secondary | ICD-10-CM | POA: Diagnosis not present

## 2023-04-18 DIAGNOSIS — Z9884 Bariatric surgery status: Secondary | ICD-10-CM | POA: Diagnosis not present

## 2023-04-18 DIAGNOSIS — Z6830 Body mass index (BMI) 30.0-30.9, adult: Secondary | ICD-10-CM | POA: Diagnosis not present

## 2023-04-18 DIAGNOSIS — E1143 Type 2 diabetes mellitus with diabetic autonomic (poly)neuropathy: Secondary | ICD-10-CM | POA: Diagnosis not present

## 2023-04-22 ENCOUNTER — Ambulatory Visit: Payer: PPO | Attending: Nurse Practitioner

## 2023-04-22 DIAGNOSIS — I2510 Atherosclerotic heart disease of native coronary artery without angina pectoris: Secondary | ICD-10-CM

## 2023-04-22 DIAGNOSIS — I700 Atherosclerosis of aorta: Secondary | ICD-10-CM | POA: Diagnosis not present

## 2023-04-22 DIAGNOSIS — K760 Fatty (change of) liver, not elsewhere classified: Secondary | ICD-10-CM | POA: Diagnosis not present

## 2023-04-22 DIAGNOSIS — Z9884 Bariatric surgery status: Secondary | ICD-10-CM | POA: Diagnosis not present

## 2023-04-22 DIAGNOSIS — R42 Dizziness and giddiness: Secondary | ICD-10-CM

## 2023-04-22 DIAGNOSIS — K449 Diaphragmatic hernia without obstruction or gangrene: Secondary | ICD-10-CM | POA: Diagnosis not present

## 2023-04-25 DIAGNOSIS — S233XXA Sprain of ligaments of thoracic spine, initial encounter: Secondary | ICD-10-CM | POA: Diagnosis not present

## 2023-04-25 DIAGNOSIS — M9902 Segmental and somatic dysfunction of thoracic region: Secondary | ICD-10-CM | POA: Diagnosis not present

## 2023-04-25 DIAGNOSIS — S134XXA Sprain of ligaments of cervical spine, initial encounter: Secondary | ICD-10-CM | POA: Diagnosis not present

## 2023-04-25 DIAGNOSIS — M9901 Segmental and somatic dysfunction of cervical region: Secondary | ICD-10-CM | POA: Diagnosis not present

## 2023-04-25 DIAGNOSIS — M9903 Segmental and somatic dysfunction of lumbar region: Secondary | ICD-10-CM | POA: Diagnosis not present

## 2023-04-25 DIAGNOSIS — M47816 Spondylosis without myelopathy or radiculopathy, lumbar region: Secondary | ICD-10-CM | POA: Diagnosis not present

## 2023-05-01 DIAGNOSIS — E113412 Type 2 diabetes mellitus with severe nonproliferative diabetic retinopathy with macular edema, left eye: Secondary | ICD-10-CM | POA: Diagnosis not present

## 2023-05-01 DIAGNOSIS — E113411 Type 2 diabetes mellitus with severe nonproliferative diabetic retinopathy with macular edema, right eye: Secondary | ICD-10-CM | POA: Diagnosis not present

## 2023-05-09 DIAGNOSIS — S134XXA Sprain of ligaments of cervical spine, initial encounter: Secondary | ICD-10-CM | POA: Diagnosis not present

## 2023-05-09 DIAGNOSIS — M9901 Segmental and somatic dysfunction of cervical region: Secondary | ICD-10-CM | POA: Diagnosis not present

## 2023-05-09 DIAGNOSIS — M9902 Segmental and somatic dysfunction of thoracic region: Secondary | ICD-10-CM | POA: Diagnosis not present

## 2023-05-09 DIAGNOSIS — M9903 Segmental and somatic dysfunction of lumbar region: Secondary | ICD-10-CM | POA: Diagnosis not present

## 2023-05-09 DIAGNOSIS — S233XXA Sprain of ligaments of thoracic spine, initial encounter: Secondary | ICD-10-CM | POA: Diagnosis not present

## 2023-05-09 DIAGNOSIS — M47816 Spondylosis without myelopathy or radiculopathy, lumbar region: Secondary | ICD-10-CM | POA: Diagnosis not present

## 2023-05-21 DIAGNOSIS — H35033 Hypertensive retinopathy, bilateral: Secondary | ICD-10-CM | POA: Diagnosis not present

## 2023-05-21 DIAGNOSIS — E113411 Type 2 diabetes mellitus with severe nonproliferative diabetic retinopathy with macular edema, right eye: Secondary | ICD-10-CM | POA: Diagnosis not present

## 2023-05-21 DIAGNOSIS — E113412 Type 2 diabetes mellitus with severe nonproliferative diabetic retinopathy with macular edema, left eye: Secondary | ICD-10-CM | POA: Diagnosis not present

## 2023-05-23 DIAGNOSIS — M9901 Segmental and somatic dysfunction of cervical region: Secondary | ICD-10-CM | POA: Diagnosis not present

## 2023-05-23 DIAGNOSIS — M9903 Segmental and somatic dysfunction of lumbar region: Secondary | ICD-10-CM | POA: Diagnosis not present

## 2023-05-23 DIAGNOSIS — S233XXA Sprain of ligaments of thoracic spine, initial encounter: Secondary | ICD-10-CM | POA: Diagnosis not present

## 2023-05-23 DIAGNOSIS — M9902 Segmental and somatic dysfunction of thoracic region: Secondary | ICD-10-CM | POA: Diagnosis not present

## 2023-05-23 DIAGNOSIS — M47816 Spondylosis without myelopathy or radiculopathy, lumbar region: Secondary | ICD-10-CM | POA: Diagnosis not present

## 2023-05-23 DIAGNOSIS — S134XXA Sprain of ligaments of cervical spine, initial encounter: Secondary | ICD-10-CM | POA: Diagnosis not present

## 2023-06-03 ENCOUNTER — Encounter: Payer: Self-pay | Admitting: Nurse Practitioner

## 2023-06-03 ENCOUNTER — Ambulatory Visit: Payer: PPO | Attending: Nurse Practitioner | Admitting: Nurse Practitioner

## 2023-06-03 VITALS — BP 118/80 | HR 84 | Ht 71.0 in | Wt 220.4 lb

## 2023-06-03 DIAGNOSIS — Z79899 Other long term (current) drug therapy: Secondary | ICD-10-CM

## 2023-06-03 DIAGNOSIS — I2510 Atherosclerotic heart disease of native coronary artery without angina pectoris: Secondary | ICD-10-CM | POA: Diagnosis not present

## 2023-06-03 DIAGNOSIS — R748 Abnormal levels of other serum enzymes: Secondary | ICD-10-CM

## 2023-06-03 DIAGNOSIS — R232 Flushing: Secondary | ICD-10-CM | POA: Diagnosis not present

## 2023-06-03 DIAGNOSIS — I10 Essential (primary) hypertension: Secondary | ICD-10-CM | POA: Diagnosis not present

## 2023-06-03 DIAGNOSIS — K660 Peritoneal adhesions (postprocedural) (postinfection): Secondary | ICD-10-CM

## 2023-06-03 DIAGNOSIS — K828 Other specified diseases of gallbladder: Secondary | ICD-10-CM | POA: Diagnosis not present

## 2023-06-03 DIAGNOSIS — R42 Dizziness and giddiness: Secondary | ICD-10-CM

## 2023-06-03 DIAGNOSIS — I9581 Postprocedural hypotension: Secondary | ICD-10-CM | POA: Diagnosis not present

## 2023-06-03 DIAGNOSIS — E785 Hyperlipidemia, unspecified: Secondary | ICD-10-CM

## 2023-06-03 NOTE — Progress Notes (Unsigned)
 Cardiology Office Note:  .   Date:  06/03/2023 ID:  [name], DOB [birth year], MRN [id number] PCP: Juliette Alcide, MD  [facility] Providers Cardiologist:  Dina Rich, MD    History of Present Illness: .   [name] is a 68 y.o. male with a PMH of CAD, hypertension, type 2 diabetes, hyperlipidemia, obesity, who presents today for follow-up.   Last seen by Edd Fabian, NP on November 28, 2022 for chest discomfort and lower extremity swelling evaluation.  Patient reported developing body aches, fatigue, lower extremity swelling, loss of appetite after receiving the shingles vaccine.  Patient received antibiotic treatment.  He noted his leg swelling improved.  Echocardiogram was arranged and revealed normal EF, no significant valvular abnormalities, mildly dilated aortic root.  I last saw him for follow-up on March 01, 2023. Stated he underwent gall bladder surgery at [facility] earlier this year. Since recovering from this surgery and returning to working out, he reported episodes of BP dropping and fluctuating. Had to hold off on working out as he has episodes of feeling weak/feeling flushed when his BP drops. Denied any chest pain, shortness of breath, palpitations, syncope, presyncope, dizziness, orthopnea, PND, swelling or significant weight changes, acute bleeding, or claudication. Recent BP log shows normal readings.  Reported he had gastric bypass surgery around 4 years ago.   04/08/2023 - Today presents for follow-up.  He says he had another episode of feeling "flushed" 1 to 2 days after office visit.  Says he has not been working out and holding off on this to prevent recurrent episodes. Denies any chest pain, shortness of breath, palpitations, syncope, presyncope, dizziness, orthopnea, PND, swelling or significant weight changes, acute bleeding, or claudication. Does have episodes of feeling lightheaded, however he says overall he is doing well since he is not working  out.  Says he is still no longer taking ramipril or metoprolol.  06/03/2023 -presents today for follow-up.  Says he is doing similar to last office visit.  Says as long as he is not working out, denies any recurrent episodes of feeling "flushed." Denies any chest pain, shortness of breath, palpitations, syncope, presyncope, dizziness, orthopnea, PND, swelling or significant weight changes, acute bleeding, or claudication.  Did have an episode at Dr. Cato Mulligan office for BP dropped to 80/60 and was symptomatic per his report.  Says he has had a CT scan performed last month that was arranged by his PCP that revealed cystic focus in gallbladder fossa compatible with remnant gallbladder per indication and is intimately associated with the proximal duodenum possibly reflecting adhesion.  Other findings on CT scan of abdomen/pelvis included diffuse hepatic steatosis, prior gastric surgery with duodenal switch procedure, no other acute findings. Says he continues to have episodes of low blood pressure ever since his GI surgery.   ROS: Negative. See HPI.   Studies Reviewed: Marland Kitchen    EKG: EKG is not ordered today.  Carotid duplex 04/2023:  Summary:  Right Carotid: Velocities in the right ICA are consistent with a 1-39%  stenosis. Non-hemodynamically significant plaque <50% noted in the  CCA. The ECA appears <50% stenosed.   Left Carotid: Velocities in the left ICA are consistent with a 1-39%  stenosis. Non-hemodynamically significant plaque <50% noted in the  CCA. The ECA appears <50% stenosed.   Vertebrals:  Bilateral vertebral arteries demonstrate antegrade flow.  Subclavians: Normal flow hemodynamics were seen in bilateral subclavian arteries.   Cardiac monitor 02/2023: Patch Wear Time:  4 days and  6 hours (2024-12-20T09:52:52-0500 to 2024-12-24T15:56:53-0500)   Patient had a min HR of 45 bpm, max HR of 145 bpm, and avg HR of 87 bpm. Predominant underlying rhythm was Sinus Rhythm. Slight P wave  morphology changes were noted. 1 run of Ventricular Tachycardia occurred lasting 5 beats with a max rate of 140 bpm  (avg 106 bpm). 4 Supraventricular Tachycardia runs occurred, the run with the fastest interval lasting 4 beats with a max rate of 143 bpm, the longest lasting 21.1 secs with an avg rate of 135 bpm. Second Degree AV Block-Mobitz I (Wenckebach) was  present. Isolated SVEs were occasional (1.1%, 4331), SVE Couplets were rare (<1.0%, 87), and SVE Triplets were rare (<1.0%, 88). Isolated VEs were rare (<1.0%), VE Couplets were rare (<1.0%), and no VE Triplets were present. Ventricular Bigeminy was  present.  Echo 12/2022:  1. Left ventricular ejection fraction, by estimation, is 60 to 65%. The  left ventricle has normal function. The left ventricle has no regional  wall motion abnormalities. Left ventricular diastolic parameters were  normal.   2. Right ventricular systolic function is mildly reduced. The right  ventricular size is mildly enlarged.   3. Left atrial size was mildly dilated.   4. The mitral valve is normal in structure. No evidence of mitral valve  regurgitation. No evidence of mitral stenosis.   5. The aortic valve is tricuspid. There is mild calcification of the  aortic valve. Aortic valve regurgitation is not visualized. Aortic valve  sclerosis is present, with no evidence of aortic valve stenosis.   6. Aortic dilatation noted. There is mild dilatation of the aortic root,  measuring 39 mm.   7. The inferior vena cava is normal in size with greater than 50%  respiratory variability, suggesting right atrial pressure of 3 mmHg.  Lexiscan 08/2015: Blood pressure demonstrated a hypertensive response to exercise. There was no ST segment deviation noted during stress. Defect 1: There is a small defect of mild severity present in the mid inferior, mid inferolateral and apical inferior location. This is consistent with soft tissue attenuation artifact. No areas of ischemia  or infarction. This is a low risk study. Nuclear stress EF: 68%.  Physical Exam:   VS:  BP 118/80 (BP Location: Left Arm, Patient Position: Sitting, Cuff Size: Large)   Pulse 84   Ht 5\' 11"  (1.803 m)   Wt 220 lb 6.4 oz (100 kg)   BMI 30.74 kg/m    Wt Readings from Last 3 Encounters:  06/03/23 220 lb 6.4 oz (100 kg)  04/08/23 222 lb 3.2 oz (100.8 kg)  03/01/23 223 lb 3.2 oz (101.2 kg)    GEN: Obese, 68 y.o. male in no acute distress NECK: No JVD; No carotid bruits CARDIAC: S1/S2, RRR, no murmurs, rubs, gallops RESPIRATORY:  Clear to auscultation without rales, wheezing or rhonchi  ABDOMEN: Soft, non-tender, non-distended EXTREMITIES:  No edema; No deformity   ASSESSMENT AND PLAN: .    CAD Denies any chest pains. Stable with no anginal symptoms. No indication for ischemic evaluation. NST in 2017 was low risk. Continue Aspirin and rosuvastatin. Continue to hold Toprol XL and ramipril. Heart healthy diet encouraged. Care and ED precautions discussed.   HTN BP stable in office today. Previous orthostatics negative today in office, however is noticing BP drops per his report. Past cardiac workup benign. No medication changes at this time. Given BP log and instructed to monitor BP during episodes. Discussed to monitor BP at home at least 2 hours  after medications and sitting for 5-10 minutes.   HLD, elevated liver enzymes Past LDL 23. Most recent liver enzymes on file are elevated, (ALT)57/ (AST)61. Will hold Crestor for 1 month due to elevated liver enzymes from previous labs.  Will repeat FLP/CMET in 1 month for comparison.  Continue to follow with PCP.  4. Lightheadedness, flushing, hypotension Etiology unclear, however does appear his blood pressure dropping seems to be a component as well as hx of past GI surgery.  Patient said he has had no issues with his BP until after he had his GI surgery.  Past orthostatics are negative.  Overall his cardiac workup does not explain his  symptoms. Conservative measures discussed.  Care and ED precautions discussed. No medication changes at this time. Will provide referral to general surgery as mentioned below.   5. Gallbladder adhesions CT scan from [facility] in February 2025 revealed cystic focus in gallbladder fossa compatible with remnant gallbladder per indication and is intimately associated with the proximal duodenum possibly reflecting adhesion.   Dispo: Follow-up with me/APP in 2-3 months or sooner if anything changes.   Signed, Sharlene Dory, NP

## 2023-06-03 NOTE — Patient Instructions (Addendum)
 Medication Instructions:  Your physician has recommended you make the following change in your medication:  Hold rosuvastatin Continue all other medications as prescribed  Labwork: Your physician recommends that you return for a FASTING lipid profile & CMET in 1 month. Please do not eat or drink for at least 8 hours when you have this done. You may take your medications that morning with a sip of water. [employer] [employer] ([email or web address]) or [facility]  Testing/Procedures: none  Follow-Up: Your physician recommends that you schedule a follow-up appointment in: 2-3 months  Any Other Special Instructions Will Be Listed Below (If Applicable). You have been referred to General Surgeon Algis Greenhouse)  If you need a refill on your cardiac medications before your next appointment, please call your pharmacy.

## 2023-06-06 DIAGNOSIS — M9903 Segmental and somatic dysfunction of lumbar region: Secondary | ICD-10-CM | POA: Diagnosis not present

## 2023-06-06 DIAGNOSIS — S233XXA Sprain of ligaments of thoracic spine, initial encounter: Secondary | ICD-10-CM | POA: Diagnosis not present

## 2023-06-06 DIAGNOSIS — M47816 Spondylosis without myelopathy or radiculopathy, lumbar region: Secondary | ICD-10-CM | POA: Diagnosis not present

## 2023-06-06 DIAGNOSIS — M9902 Segmental and somatic dysfunction of thoracic region: Secondary | ICD-10-CM | POA: Diagnosis not present

## 2023-06-06 DIAGNOSIS — M9901 Segmental and somatic dysfunction of cervical region: Secondary | ICD-10-CM | POA: Diagnosis not present

## 2023-06-06 DIAGNOSIS — S134XXA Sprain of ligaments of cervical spine, initial encounter: Secondary | ICD-10-CM | POA: Diagnosis not present

## 2023-06-20 DIAGNOSIS — S233XXA Sprain of ligaments of thoracic spine, initial encounter: Secondary | ICD-10-CM | POA: Diagnosis not present

## 2023-06-20 DIAGNOSIS — M9901 Segmental and somatic dysfunction of cervical region: Secondary | ICD-10-CM | POA: Diagnosis not present

## 2023-06-20 DIAGNOSIS — M9902 Segmental and somatic dysfunction of thoracic region: Secondary | ICD-10-CM | POA: Diagnosis not present

## 2023-06-20 DIAGNOSIS — S134XXA Sprain of ligaments of cervical spine, initial encounter: Secondary | ICD-10-CM | POA: Diagnosis not present

## 2023-06-20 DIAGNOSIS — M9903 Segmental and somatic dysfunction of lumbar region: Secondary | ICD-10-CM | POA: Diagnosis not present

## 2023-06-20 DIAGNOSIS — M47816 Spondylosis without myelopathy or radiculopathy, lumbar region: Secondary | ICD-10-CM | POA: Diagnosis not present

## 2023-06-28 ENCOUNTER — Ambulatory Visit: Payer: PPO | Admitting: Urology

## 2023-07-03 ENCOUNTER — Ambulatory Visit: Payer: PPO | Admitting: Urology

## 2023-07-03 ENCOUNTER — Encounter: Payer: Self-pay | Admitting: Urology

## 2023-07-03 VITALS — BP 129/80 | HR 88

## 2023-07-03 DIAGNOSIS — N138 Other obstructive and reflux uropathy: Secondary | ICD-10-CM | POA: Diagnosis not present

## 2023-07-03 DIAGNOSIS — N401 Enlarged prostate with lower urinary tract symptoms: Secondary | ICD-10-CM | POA: Diagnosis not present

## 2023-07-03 DIAGNOSIS — R339 Retention of urine, unspecified: Secondary | ICD-10-CM

## 2023-07-03 DIAGNOSIS — R351 Nocturia: Secondary | ICD-10-CM

## 2023-07-03 DIAGNOSIS — R338 Other retention of urine: Secondary | ICD-10-CM | POA: Diagnosis not present

## 2023-07-03 LAB — URINALYSIS, ROUTINE W REFLEX MICROSCOPIC
Bilirubin, UA: NEGATIVE
Ketones, UA: NEGATIVE
Leukocytes,UA: NEGATIVE
Nitrite, UA: NEGATIVE
Protein,UA: NEGATIVE
RBC, UA: NEGATIVE
Specific Gravity, UA: 1.015 (ref 1.005–1.030)
Urobilinogen, Ur: 1 mg/dL (ref 0.2–1.0)
pH, UA: 6 (ref 5.0–7.5)

## 2023-07-03 LAB — BLADDER SCAN AMB NON-IMAGING: Scan Result: 223

## 2023-07-03 MED ORDER — SILODOSIN 8 MG PO CAPS: 8.0000 mg | ORAL_CAPSULE | Freq: Every evening | ORAL | 11 refills | Status: DC

## 2023-07-03 MED ORDER — FINASTERIDE 5 MG PO TABS: 5.0000 mg | ORAL_TABLET | Freq: Every day | ORAL | 3 refills | Status: DC

## 2023-07-03 NOTE — Progress Notes (Signed)
 07/03/2023 9:00 AM   [name] [birth year] [id number]  Referring provider: Alston Jerry, MD [address] [place],  [place] [address]  Followup BPH   HPI: Mr [name] is a 68yo here for followup for BPh with incomplete emptying and nocturia. IPSS 11 QOL 3 on rapaflo  8mg ,. He has bothersome nocturia 3-5x. Urine stream strong.  No straining to urinate.    PMH: Past Medical History:  Diagnosis Date   Arthritis    generalized   Coronary artery disease    stents placed 2003   Hyperlipidemia    Hypertension    Sleep apnea    on CPAP    Surgical History: Past Surgical History:  Procedure Laterality Date   BARIATRIC SURGERY  12/2015   BICEPS TENDON REPAIR     CARDIAC CATHETERIZATION  2000   DRUG INDUCED ENDOSCOPY N/A 08/26/2020   Procedure: DRUG INDUCED ENDOSCOPY;  Surgeon: Ammon Bales, MD;  Location: [facility];  Service: ENT;  Laterality: N/A;   IMPLANTATION OF HYPOGLOSSAL NERVE STIMULATOR Right 01/25/2021   Procedure: IMPLANTATION OF HYPOGLOSSAL NERVE STIMULATOR;  Surgeon: Ammon Bales, MD;  Location: [facility];  Service: ENT;  Laterality: Right;   KNEE ARTHROSCOPY Bilateral    septorhinoplasty     SHOULDER ARTHROSCOPY Bilateral    TONSILLECTOMY     UVULLA REMOVAL      Home Medications:  Allergies as of 07/03/2023   No Known Allergies      Medication List        Accurate as of July 03, 2023  9:00 AM. If you have any questions, ask your nurse or doctor.          aspirin 81 MG tablet Take 81 mg by mouth daily.   b complex vitamins tablet Take 1 tablet by mouth daily.   Calcium  500-100 MG-UNIT Chew Chew 1 tablet by mouth 4 (four) times daily.   cyanocobalamin  1000 MCG tablet Commonly known as: VITAMIN B12 Take 1,000 mcg by mouth daily.   ferrous sulfate 325 (65 FE) MG EC tablet Take 325 mg by mouth daily.   icosapent Ethyl 1 g capsule Commonly known as: VASCEPA Take 1 g by mouth 2 (two) times daily at 10 AM and 5 PM.    Jardiance 25 MG Tabs tablet Generic drug: empagliflozin Take 12.5 mg by mouth daily.   metFORMIN 850 MG tablet Commonly known as: GLUCOPHAGE Take 850 mg by mouth 2 (two) times daily with a meal.   metoprolol  succinate 25 MG 24 hr tablet Commonly known as: TOPROL -XL TAKE 1 TABLET BY MOUTH  DAILY   multivitamin tablet Take 1 tablet by mouth 2 (two) times daily.   OMEGA-3 FISH OIL PO Take 1 tablet by mouth 2 (two) times daily at 10 AM and 5 PM.   pantoprazole 40 MG tablet Commonly known as: PROTONIX Take 40 mg by mouth daily.   potassium chloride SA 20 MEQ tablet Commonly known as: KLOR-CON M Take 20 mEq by mouth 2 (two) times daily.   ramipril  2.5 MG capsule Commonly known as: ALTACE  TAKE 1 CAPSULE BY MOUTH  DAILY   rosuvastatin  20 MG tablet Commonly known as: CRESTOR  Take 20 mg by mouth daily. 06/03/2023 HOLD until further notice   silodosin  8 MG Caps capsule Commonly known as: RAPAFLO  Take 1 capsule (8 mg total) by mouth at bedtime.   SYSTANE OP Place 1 drop into both eyes daily as needed (dry eyes).   Vitamin D 50 MCG (2000 UT) tablet  Take 6,000 Units by mouth daily.        Allergies: No Known Allergies  Family History: Family History  Problem Relation Age of Onset   Diabetes Mother    Diabetes Father    Heart attack Father     Social History:  reports that he has never smoked. He has never used smokeless tobacco. He reports that he does not currently use alcohol . He reports that he does not use drugs.  ROS: All other review of systems were reviewed and are negative except what is noted above in HPI  Physical Exam: BP 129/80   Pulse 88   Constitutional:  Alert and oriented, No acute distress. HEENT:  AT, moist mucus membranes.  Trachea midline, no masses. Cardiovascular: No clubbing, cyanosis, or edema. Respiratory: Normal respiratory effort, no increased work of breathing. GI: Abdomen is soft, nontender, nondistended, no abdominal  masses GU: No CVA tenderness.  Lymph: No cervical or inguinal lymphadenopathy. Skin: No rashes, bruises or suspicious lesions. Neurologic: Grossly intact, no focal deficits, moving all 4 extremities. Psychiatric: Normal mood and affect.  Laboratory Data: Lab Results  Component Value Date   WBC 8.5 01/18/2021   HGB 12.0 (L) 01/18/2021   HCT 37.8 (L) 01/18/2021   MCV 90.9 01/18/2021   PLT 159 01/18/2021    Lab Results  Component Value Date   CREATININE 0.83 01/18/2021    No results found for: "PSA"  No results found for: "TESTOSTERONE"  Lab Results  Component Value Date   HGBA1C (H) 02/19/2007    8.5 (NOTE)   The ADA recommends the following therapeutic goals for glycemic   control related to Hgb A1C measurement:   Goal of Therapy:   < 7.0% Hgb A1C   Action Suggested:  > 8.0% Hgb A1C   Ref:  Diabetes Care, 22, Suppl. 1, 1999    Urinalysis    Component Value Date/Time   APPEARANCEUR Clear 03/29/2023 1242   GLUCOSEU 3+ (A) 03/29/2023 1242   BILIRUBINUR Negative 03/29/2023 1242   PROTEINUR Negative 03/29/2023 1242   NITRITE Negative 03/29/2023 1242   LEUKOCYTESUR Negative 03/29/2023 1242    Lab Results  Component Value Date   LABMICR Comment 03/29/2023   WBCUA 0-5 07/18/2021   LABEPIT 0-10 07/18/2021   BACTERIA Few (A) 07/18/2021    Pertinent Imaging:  No results found for this or any previous visit.  No results found for this or any previous visit.  No results found for this or any previous visit.  No results found for this or any previous visit.  No results found for this or any previous visit.  No results found for this or any previous visit.  No results found for this or any previous visit.  No results found for this or any previous visit.   Assessment & Plan:    1. BPH with obstruction/lower urinary tract symptoms (Primary) We discussed the management of his BPH including continued medical therapy, Rezum, Urolift, TURP and simple  prostatectomy. After discussing the options the patient has elected to proceed with medical therapy. Risks/benefits/alternatives discussed.  - BLADDER SCAN AMB NON-IMAGING - Urinalysis, Routine w reflex microscopic  2. Incomplete bladder emptying Rapaflo  8mg  daily and fiansteride 5mg  daily  3. Nocturia Rapaflo  8mg  daily   No follow-ups on file.  Johnie Nailer, MD  [facility] 

## 2023-07-03 NOTE — Patient Instructions (Signed)

## 2023-07-03 NOTE — Progress Notes (Signed)
post void residual=223

## 2023-07-04 DIAGNOSIS — M47816 Spondylosis without myelopathy or radiculopathy, lumbar region: Secondary | ICD-10-CM | POA: Diagnosis not present

## 2023-07-04 DIAGNOSIS — M9903 Segmental and somatic dysfunction of lumbar region: Secondary | ICD-10-CM | POA: Diagnosis not present

## 2023-07-04 DIAGNOSIS — M9902 Segmental and somatic dysfunction of thoracic region: Secondary | ICD-10-CM | POA: Diagnosis not present

## 2023-07-04 DIAGNOSIS — S134XXA Sprain of ligaments of cervical spine, initial encounter: Secondary | ICD-10-CM | POA: Diagnosis not present

## 2023-07-04 DIAGNOSIS — S233XXA Sprain of ligaments of thoracic spine, initial encounter: Secondary | ICD-10-CM | POA: Diagnosis not present

## 2023-07-04 DIAGNOSIS — M9901 Segmental and somatic dysfunction of cervical region: Secondary | ICD-10-CM | POA: Diagnosis not present

## 2023-07-11 DIAGNOSIS — E1143 Type 2 diabetes mellitus with diabetic autonomic (poly)neuropathy: Secondary | ICD-10-CM | POA: Diagnosis not present

## 2023-07-11 DIAGNOSIS — E876 Hypokalemia: Secondary | ICD-10-CM | POA: Diagnosis not present

## 2023-07-11 DIAGNOSIS — Z1329 Encounter for screening for other suspected endocrine disorder: Secondary | ICD-10-CM | POA: Diagnosis not present

## 2023-07-11 DIAGNOSIS — D72829 Elevated white blood cell count, unspecified: Secondary | ICD-10-CM | POA: Diagnosis not present

## 2023-07-11 DIAGNOSIS — E7849 Other hyperlipidemia: Secondary | ICD-10-CM | POA: Diagnosis not present

## 2023-07-17 DIAGNOSIS — E7849 Other hyperlipidemia: Secondary | ICD-10-CM | POA: Diagnosis not present

## 2023-07-17 DIAGNOSIS — Z0000 Encounter for general adult medical examination without abnormal findings: Secondary | ICD-10-CM | POA: Diagnosis not present

## 2023-07-17 DIAGNOSIS — R238 Other skin changes: Secondary | ICD-10-CM | POA: Diagnosis not present

## 2023-07-17 DIAGNOSIS — E782 Mixed hyperlipidemia: Secondary | ICD-10-CM | POA: Diagnosis not present

## 2023-07-17 DIAGNOSIS — Z0001 Encounter for general adult medical examination with abnormal findings: Secondary | ICD-10-CM | POA: Diagnosis not present

## 2023-07-17 DIAGNOSIS — D649 Anemia, unspecified: Secondary | ICD-10-CM | POA: Diagnosis not present

## 2023-07-17 DIAGNOSIS — Z9884 Bariatric surgery status: Secondary | ICD-10-CM | POA: Diagnosis not present

## 2023-07-17 DIAGNOSIS — Z6829 Body mass index (BMI) 29.0-29.9, adult: Secondary | ICD-10-CM | POA: Diagnosis not present

## 2023-07-17 DIAGNOSIS — I2510 Atherosclerotic heart disease of native coronary artery without angina pectoris: Secondary | ICD-10-CM | POA: Diagnosis not present

## 2023-07-17 DIAGNOSIS — Z1389 Encounter for screening for other disorder: Secondary | ICD-10-CM | POA: Diagnosis not present

## 2023-07-17 DIAGNOSIS — Z1331 Encounter for screening for depression: Secondary | ICD-10-CM | POA: Diagnosis not present

## 2023-07-17 DIAGNOSIS — K760 Fatty (change of) liver, not elsewhere classified: Secondary | ICD-10-CM | POA: Diagnosis not present

## 2023-07-17 DIAGNOSIS — R5383 Other fatigue: Secondary | ICD-10-CM | POA: Diagnosis not present

## 2023-07-18 DIAGNOSIS — M9902 Segmental and somatic dysfunction of thoracic region: Secondary | ICD-10-CM | POA: Diagnosis not present

## 2023-07-18 DIAGNOSIS — S134XXA Sprain of ligaments of cervical spine, initial encounter: Secondary | ICD-10-CM | POA: Diagnosis not present

## 2023-07-18 DIAGNOSIS — M9901 Segmental and somatic dysfunction of cervical region: Secondary | ICD-10-CM | POA: Diagnosis not present

## 2023-07-18 DIAGNOSIS — S233XXA Sprain of ligaments of thoracic spine, initial encounter: Secondary | ICD-10-CM | POA: Diagnosis not present

## 2023-07-18 DIAGNOSIS — M47816 Spondylosis without myelopathy or radiculopathy, lumbar region: Secondary | ICD-10-CM | POA: Diagnosis not present

## 2023-07-18 DIAGNOSIS — M9903 Segmental and somatic dysfunction of lumbar region: Secondary | ICD-10-CM | POA: Diagnosis not present

## 2023-07-22 DIAGNOSIS — K760 Fatty (change of) liver, not elsewhere classified: Secondary | ICD-10-CM | POA: Diagnosis not present

## 2023-07-22 DIAGNOSIS — E7849 Other hyperlipidemia: Secondary | ICD-10-CM | POA: Diagnosis not present

## 2023-07-22 DIAGNOSIS — R238 Other skin changes: Secondary | ICD-10-CM | POA: Diagnosis not present

## 2023-07-22 DIAGNOSIS — E782 Mixed hyperlipidemia: Secondary | ICD-10-CM | POA: Diagnosis not present

## 2023-07-22 DIAGNOSIS — Z9884 Bariatric surgery status: Secondary | ICD-10-CM | POA: Diagnosis not present

## 2023-07-22 DIAGNOSIS — R5383 Other fatigue: Secondary | ICD-10-CM | POA: Diagnosis not present

## 2023-07-25 ENCOUNTER — Ambulatory Visit: Payer: Self-pay | Admitting: Nurse Practitioner

## 2023-07-25 DIAGNOSIS — R748 Abnormal levels of other serum enzymes: Secondary | ICD-10-CM

## 2023-07-25 DIAGNOSIS — Z79899 Other long term (current) drug therapy: Secondary | ICD-10-CM

## 2023-07-29 ENCOUNTER — Telehealth: Payer: Self-pay | Admitting: Cardiology

## 2023-07-29 NOTE — Telephone Encounter (Signed)
Pt is requesting a callback regarding lab orders. Please advise

## 2023-07-29 NOTE — Telephone Encounter (Signed)
 Patient was advised to have labs done prior to visit with Clementine Cutting on 05/27. Wanted to have completed at [facility] advised him that if so he would need them to fax over the results as we cannot see them. He hadn't called them to see if he was able to have them done there yet. I told him he could call them first or we could print off lab orders and he can take to [facility] to have them completed and they will be uploaded to his chart. Patient stated that he will just come by the office and pick up lab orders and go the [facility] to have completed

## 2023-07-30 ENCOUNTER — Other Ambulatory Visit (HOSPITAL_COMMUNITY)
Admission: RE | Admit: 2023-07-30 | Discharge: 2023-07-30 | Disposition: A | Discharge status: Home / Self Care | Source: Ambulatory Visit | Attending: Nurse Practitioner | Admitting: Nurse Practitioner

## 2023-07-30 DIAGNOSIS — R748 Abnormal levels of other serum enzymes: Secondary | ICD-10-CM | POA: Insufficient documentation

## 2023-07-30 LAB — COMPREHENSIVE METABOLIC PANEL WITH GFR
ALT: 36 U/L (ref 0–44)
AST: 27 U/L (ref 15–41)
Albumin: 3.5 g/dL (ref 3.5–5.0)
Alkaline Phosphatase: 129 U/L — ABNORMAL HIGH (ref 38–126)
Anion gap: 10 (ref 5–15)
BUN: 8 mg/dL (ref 8–23)
CO2: 23 mmol/L (ref 22–32)
Calcium: 8.8 mg/dL — ABNORMAL LOW (ref 8.9–10.3)
Chloride: 102 mmol/L (ref 98–111)
Creatinine, Ser: 0.79 mg/dL (ref 0.61–1.24)
GFR, Estimated: >60 mL/min (ref >60)
Glucose, Bld: 170 mg/dL — ABNORMAL HIGH (ref 70–99)
Potassium: 3.4 mmol/L — ABNORMAL LOW (ref 3.5–5.1)
Sodium: 135 mmol/L (ref 135–145)
Total Bilirubin: 0.6 mg/dL (ref 0.0–1.2)
Total Protein: 6.6 g/dL (ref 6.5–8.1)

## 2023-07-30 LAB — BILIRUBIN, DIRECT: Bilirubin, Direct: 0.1 mg/dL (ref 0.0–0.2)

## 2023-08-01 DIAGNOSIS — S233XXA Sprain of ligaments of thoracic spine, initial encounter: Secondary | ICD-10-CM | POA: Diagnosis not present

## 2023-08-01 DIAGNOSIS — S134XXA Sprain of ligaments of cervical spine, initial encounter: Secondary | ICD-10-CM | POA: Diagnosis not present

## 2023-08-01 DIAGNOSIS — M9903 Segmental and somatic dysfunction of lumbar region: Secondary | ICD-10-CM | POA: Diagnosis not present

## 2023-08-01 DIAGNOSIS — M47816 Spondylosis without myelopathy or radiculopathy, lumbar region: Secondary | ICD-10-CM | POA: Diagnosis not present

## 2023-08-01 DIAGNOSIS — M9901 Segmental and somatic dysfunction of cervical region: Secondary | ICD-10-CM | POA: Diagnosis not present

## 2023-08-01 DIAGNOSIS — M9902 Segmental and somatic dysfunction of thoracic region: Secondary | ICD-10-CM | POA: Diagnosis not present

## 2023-08-02 ENCOUNTER — Ambulatory Visit: Payer: Self-pay | Admitting: Nurse Practitioner

## 2023-08-06 ENCOUNTER — Encounter: Payer: Self-pay | Admitting: Nurse Practitioner

## 2023-08-06 ENCOUNTER — Ambulatory Visit: Attending: Nurse Practitioner | Admitting: Nurse Practitioner

## 2023-08-06 VITALS — BP 104/62 | HR 98 | Ht 71.0 in | Wt 218.0 lb

## 2023-08-06 DIAGNOSIS — R232 Flushing: Secondary | ICD-10-CM

## 2023-08-06 DIAGNOSIS — R748 Abnormal levels of other serum enzymes: Secondary | ICD-10-CM

## 2023-08-06 DIAGNOSIS — R42 Dizziness and giddiness: Secondary | ICD-10-CM

## 2023-08-06 DIAGNOSIS — K828 Other specified diseases of gallbladder: Secondary | ICD-10-CM | POA: Diagnosis not present

## 2023-08-06 DIAGNOSIS — I9581 Postprocedural hypotension: Secondary | ICD-10-CM

## 2023-08-06 DIAGNOSIS — Z79899 Other long term (current) drug therapy: Secondary | ICD-10-CM

## 2023-08-06 DIAGNOSIS — I2510 Atherosclerotic heart disease of native coronary artery without angina pectoris: Secondary | ICD-10-CM

## 2023-08-06 DIAGNOSIS — I77810 Thoracic aortic ectasia: Secondary | ICD-10-CM | POA: Diagnosis not present

## 2023-08-06 DIAGNOSIS — I10 Essential (primary) hypertension: Secondary | ICD-10-CM | POA: Diagnosis not present

## 2023-08-06 DIAGNOSIS — E785 Hyperlipidemia, unspecified: Secondary | ICD-10-CM

## 2023-08-06 MED ORDER — ROSUVASTATIN CALCIUM 5 MG PO TABS
5.0000 mg | ORAL_TABLET | Freq: Every day | ORAL | 0 refills | Status: AC
Start: 2023-08-06 — End: 2024-01-20

## 2023-08-06 MED ORDER — MIDODRINE HCL 2.5 MG PO TABS: 2.5000 mg | ORAL_TABLET | Freq: Three times a day (TID) | ORAL | 0 refills | Status: AC | PRN

## 2023-08-06 NOTE — Progress Notes (Unsigned)
 Cardiology Office Note:  .   Date:  08/06/2023 ID:  [name], DOB [birth year], MRN [id number] PCP: Levi Jerry, MD  [facility] Providers Cardiologist:  Levi Lander, MD    History of Present Illness: .   [name] is a 68 y.o. male with a PMH of CAD, hypertension, type 2 diabetes, hyperlipidemia, obesity, who presents today for follow-up.   Last seen by Levi Pray, NP on November 28, 2022 for chest discomfort and lower extremity swelling evaluation.  Patient reported developing body aches, fatigue, lower extremity swelling, loss of appetite after receiving the shingles vaccine.  Patient received antibiotic treatment.  He noted his leg swelling improved.  Echocardiogram was arranged and revealed normal EF, no significant valvular abnormalities, mildly dilated aortic root.  I last saw him for follow-up on March 01, 2023. Stated he underwent gall bladder surgery at [facility] earlier this year. Since recovering from this surgery and returning to working out, he reported episodes of BP dropping and fluctuating. Had to hold off on working out as he has episodes of feeling weak/feeling flushed when his BP drops. Denied any chest pain, shortness of breath, palpitations, syncope, presyncope, dizziness, orthopnea, PND, swelling or significant weight changes, acute bleeding, or claudication. Recent BP log shows normal readings.  Reported he had gastric bypass surgery around 4 years ago.   06/03/2023 -presents today for follow-up.  Says he is doing similar to last office visit.  Says as long as he is not working out, denies any recurrent episodes of feeling "flushed." Denies any chest pain, shortness of breath, palpitations, syncope, presyncope, dizziness, orthopnea, PND, swelling or significant weight changes, acute bleeding, or claudication.  Did have an episode at [name] office for BP dropped to 80/60 and was symptomatic per his report.  Says he has had a CT scan performed  last month that was arranged by his PCP that revealed cystic focus in gallbladder fossa compatible with remnant gallbladder per indication and is intimately associated with the proximal duodenum possibly reflecting adhesion.  Other findings on CT scan of abdomen/pelvis included diffuse hepatic steatosis, prior gastric surgery with duodenal switch procedure, no other acute findings. Says he continues to have episodes of low blood pressure ever since his GI surgery.   08/06/2023 - Doing well do about the same since I last saw him.  Continues to not be as active due to his blood pressures.  Continues to hold metoprolol  succinate, ramipril , and rosuvastatin . Denies any chest pain, shortness of breath, palpitations, syncope, presyncope, dizziness, orthopnea, PND, swelling or significant weight changes, acute bleeding, or claudication.  ROS: Negative. See HPI.   Studies Reviewed: [name]    EKG:  EKG Interpretation Date/Time:  Tuesday Aug 06 2023 08:34:17 EDT Ventricular Rate:  97 PR Interval:  186 QRS Duration:  88 QT Interval:  352 QTC Calculation: 447 R Axis:   -42  Text Interpretation: Normal sinus rhythm Left axis deviation Minimal voltage criteria for LVH, may be normal variant ( R in aVL ) When compared with ECG of 28-Nov-2022 09:43, No significant change was found Confirmed by [name] [phone]) on 08/06/2023 8:36:50 AM    Carotid duplex 04/2023:  Summary:  Right Carotid: Velocities in the right ICA are consistent with a 1-39%  stenosis. Non-hemodynamically significant plaque <50% noted in the  CCA. The ECA appears <50% stenosed.   Left Carotid: Velocities in the left ICA are consistent with a 1-39%  stenosis. Non-hemodynamically significant plaque <50% noted in the  CCA. The ECA appears <50% stenosed.   Vertebrals:  Bilateral vertebral arteries demonstrate antegrade flow.  Subclavians: Normal flow hemodynamics were seen in bilateral subclavian arteries.   Cardiac monitor  02/2023: Patch Wear Time:  4 days and 6 hours (2024-12-20T09:52:52-0500 to 2024-12-24T15:56:53-0500)   Patient had a min HR of 45 bpm, max HR of 145 bpm, and avg HR of 87 bpm. Predominant underlying rhythm was Sinus Rhythm. Slight P wave morphology changes were noted. 1 run of Ventricular Tachycardia occurred lasting 5 beats with a max rate of 140 bpm  (avg 106 bpm). 4 Supraventricular Tachycardia runs occurred, the run with the fastest interval lasting 4 beats with a max rate of 143 bpm, the longest lasting 21.1 secs with an avg rate of 135 bpm. Second Degree AV Block-Mobitz I (Wenckebach) was  present. Isolated SVEs were occasional (1.1%, 4331), SVE Couplets were rare (<1.0%, 87), and SVE Triplets were rare (<1.0%, 88). Isolated VEs were rare (<1.0%), VE Couplets were rare (<1.0%), and no VE Triplets were present. Ventricular Bigeminy was  present.  Echo 12/2022:  1. Left ventricular ejection fraction, by estimation, is 60 to 65%. The  left ventricle has normal function. The left ventricle has no regional  wall motion abnormalities. Left ventricular diastolic parameters were  normal.   2. Right ventricular systolic function is mildly reduced. The right  ventricular size is mildly enlarged.   3. Left atrial size was mildly dilated.   4. The mitral valve is normal in structure. No evidence of mitral valve  regurgitation. No evidence of mitral stenosis.   5. The aortic valve is tricuspid. There is mild calcification of the  aortic valve. Aortic valve regurgitation is not visualized. Aortic valve  sclerosis is present, with no evidence of aortic valve stenosis.   6. Aortic dilatation noted. There is mild dilatation of the aortic root,  measuring 39 mm.   7. The inferior vena cava is normal in size with greater than 50%  respiratory variability, suggesting right atrial pressure of 3 mmHg.  Lexiscan  08/2015: Blood pressure demonstrated a hypertensive response to exercise. There was no ST  segment deviation noted during stress. Defect 1: There is a small defect of mild severity present in the mid inferior, mid inferolateral and apical inferior location. This is consistent with soft tissue attenuation artifact. No areas of ischemia or infarction. This is a low risk study. Nuclear stress EF: 68%.  Physical Exam:   VS:  BP 104/62 (BP Location: Left Arm, Cuff Size: Large)   Pulse 98   Ht 5\' 11"  (1.803 m)   Wt 218 lb (98.9 kg)   SpO2 94%   BMI 30.40 kg/m    Wt Readings from Last 3 Encounters:  08/06/23 218 lb (98.9 kg)  06/03/23 220 lb 6.4 oz (100 kg)  04/08/23 222 lb 3.2 oz (100.8 kg)    GEN: Obese, 68 y.o. male in no acute distress NECK: No JVD; No carotid bruits CARDIAC: S1/S2, RRR, no murmurs, rubs, gallops RESPIRATORY:  Clear to auscultation without rales, wheezing or rhonchi  ABDOMEN: Soft, non-tender, non-distended EXTREMITIES:  No edema; No deformity   ASSESSMENT AND PLAN: .    CAD Denies any chest pains. Stable with no anginal symptoms. No indication for ischemic evaluation. NST in 2017 was low risk. Continue Aspirin.  Will restart rosuvastatin  at 5 mg daily, had LFT elevations with increased dose.  Continue to hold Toprol  XL and ramipril . Heart healthy diet encouraged. Care and ED precautions discussed.   HTN  BP soft today. Previous orthostatics negative in office, however has noticed BP drops per his report. Past cardiac workup benign. No medication changes at this time. Will prescribe midodrine PRN as noted below.  Given BP log and instructed to monitor BP during episodes. Discussed to monitor BP at home at least 2 hours after medications and sitting for 5-10 minutes.   HLD, medication management Most recent FLP revealed LDL was slightly elevated. Most recent liver enzymes showed normalization of AST/ALT. Will restart Crestor  at 5 mg daily and repeat FLP/LFT in 2-3 months.  Continue to follow with PCP.  4. Lightheadedness, flushing, hypotension Etiology  unclear, however does appear his blood pressure dropping seems to be a component as well as hx of past GI surgery.  Patient said he has had no issues with his BP until after he had his GI surgery.  Past orthostatics are negative.  Overall his cardiac workup does not explain his symptoms. Conservative measures previously discussed.  Care and ED precautions discussed. Will begin midodrine 2.5 mg 3 times daily as needed for BP < 90/60.  Patient verbalized understanding.  5.  Aortic root dilatation Echocardiogram in October 2024 showed mildly dilated aortic root at 39 mm.  He is scheduled for echocardiogram in October this year. Will continue to monitor.   5. Gallbladder adhesions CT scan [facility] in February 2025 revealed cystic focus in gallbladder fossa compatible with remnant gallbladder per indication and is intimately associated with the proximal duodenum possibly reflecting adhesion. Recommended to f/u with PCP and GI for further evaluation.    Dispo: Care and ED precautions discussed. Follow-up with me/APP in 6 months or sooner if anything changes.   Signed, Levi Pointer, NP

## 2023-08-06 NOTE — Patient Instructions (Addendum)
 Medication Instructions:  Your physician has recommended you make the following change in your medication:  Please restart Rosuvastatin  at 5 Mg daily  Please start Midodrine  2.5 Mg three times daily as needed for a Blood pressure less than 90/60   Labwork: In 2-3 months at [facility]   Testing/Procedures: None   Follow-Up: Your physician recommends that you schedule a follow-up appointment in: 6 Months   Any Other Special Instructions Will Be Listed Below (If Applicable).  If you need a refill on your cardiac medications before your next appointment, please call your pharmacy.

## 2023-08-09 IMAGING — DX DG NECK SOFT TISSUE
1 series · 2 of 2 positions shown · non-contrast
Comparison: None.

CLINICAL DATA: Postop images after hypo glossal nerve stimulator
placement.

EXAM:
NECK SOFT TISSUES - 1+ VIEW

[Series 1: neck · 0.14mm/px · 2 of 2 slices shown]
[im 1/2]
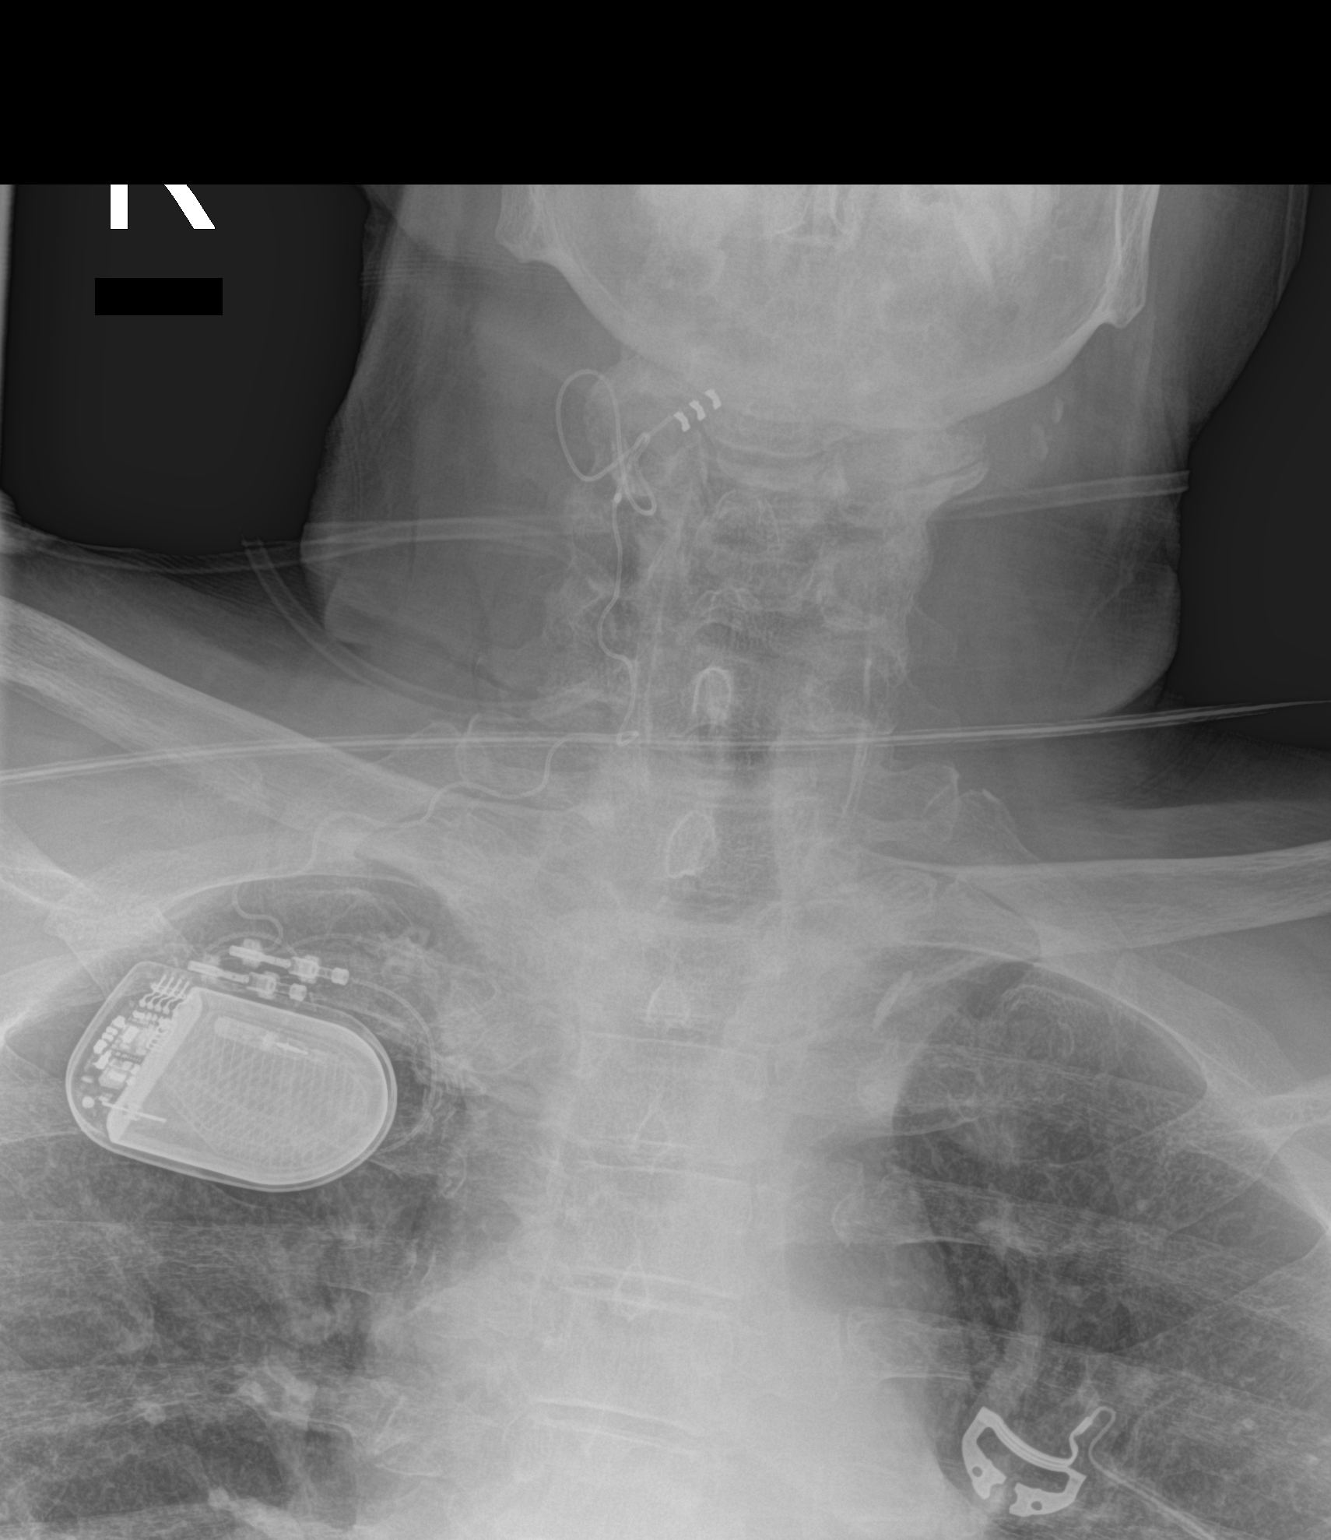
[im 2/2]
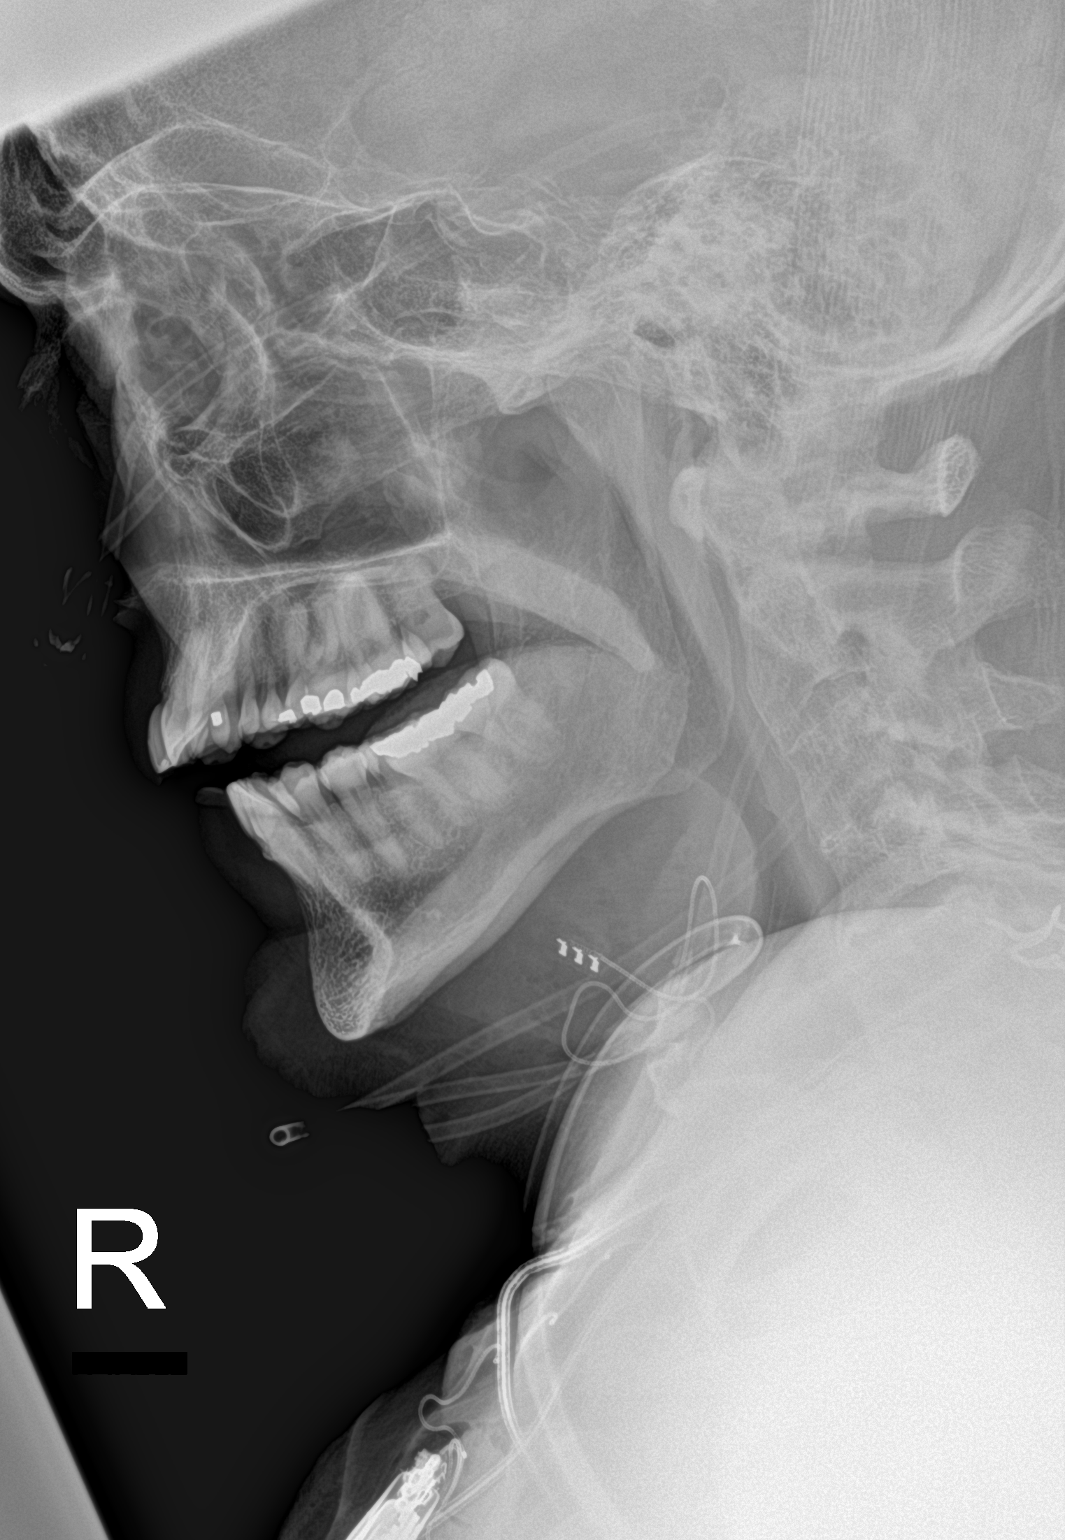

[2 of 2 positions shown; findings below may reference images not displayed]

FINDINGS: AP and lateral views of the neck demonstrate the lead for the
hypoglossal nerve stimulator projecting over the right submental
soft tissues, wire extending inferiorly to connect to the upper
anterior right chest wall generator.

No acute skeletal abnormality. Apparent osseous fusion across the
C2-C3 and C3-C4 disc spaces. Facet degenerative change noted most
evident on the left at C3-C4.

No soft tissue mass.  Airway is widely patent.
IMPRESSION: 1. Hypo glossal nerve stimulator placement as detailed.

## 2023-08-09 IMAGING — DX DG CHEST 1V PORT
1 series · 1 of 1 positions shown · non-contrast
Comparison: 07/29/2015

CLINICAL DATA: Post op films for hypoglossal nerve stimulator,
check placement

EXAM:
PORTABLE CHEST 1 VIEW

[chest]
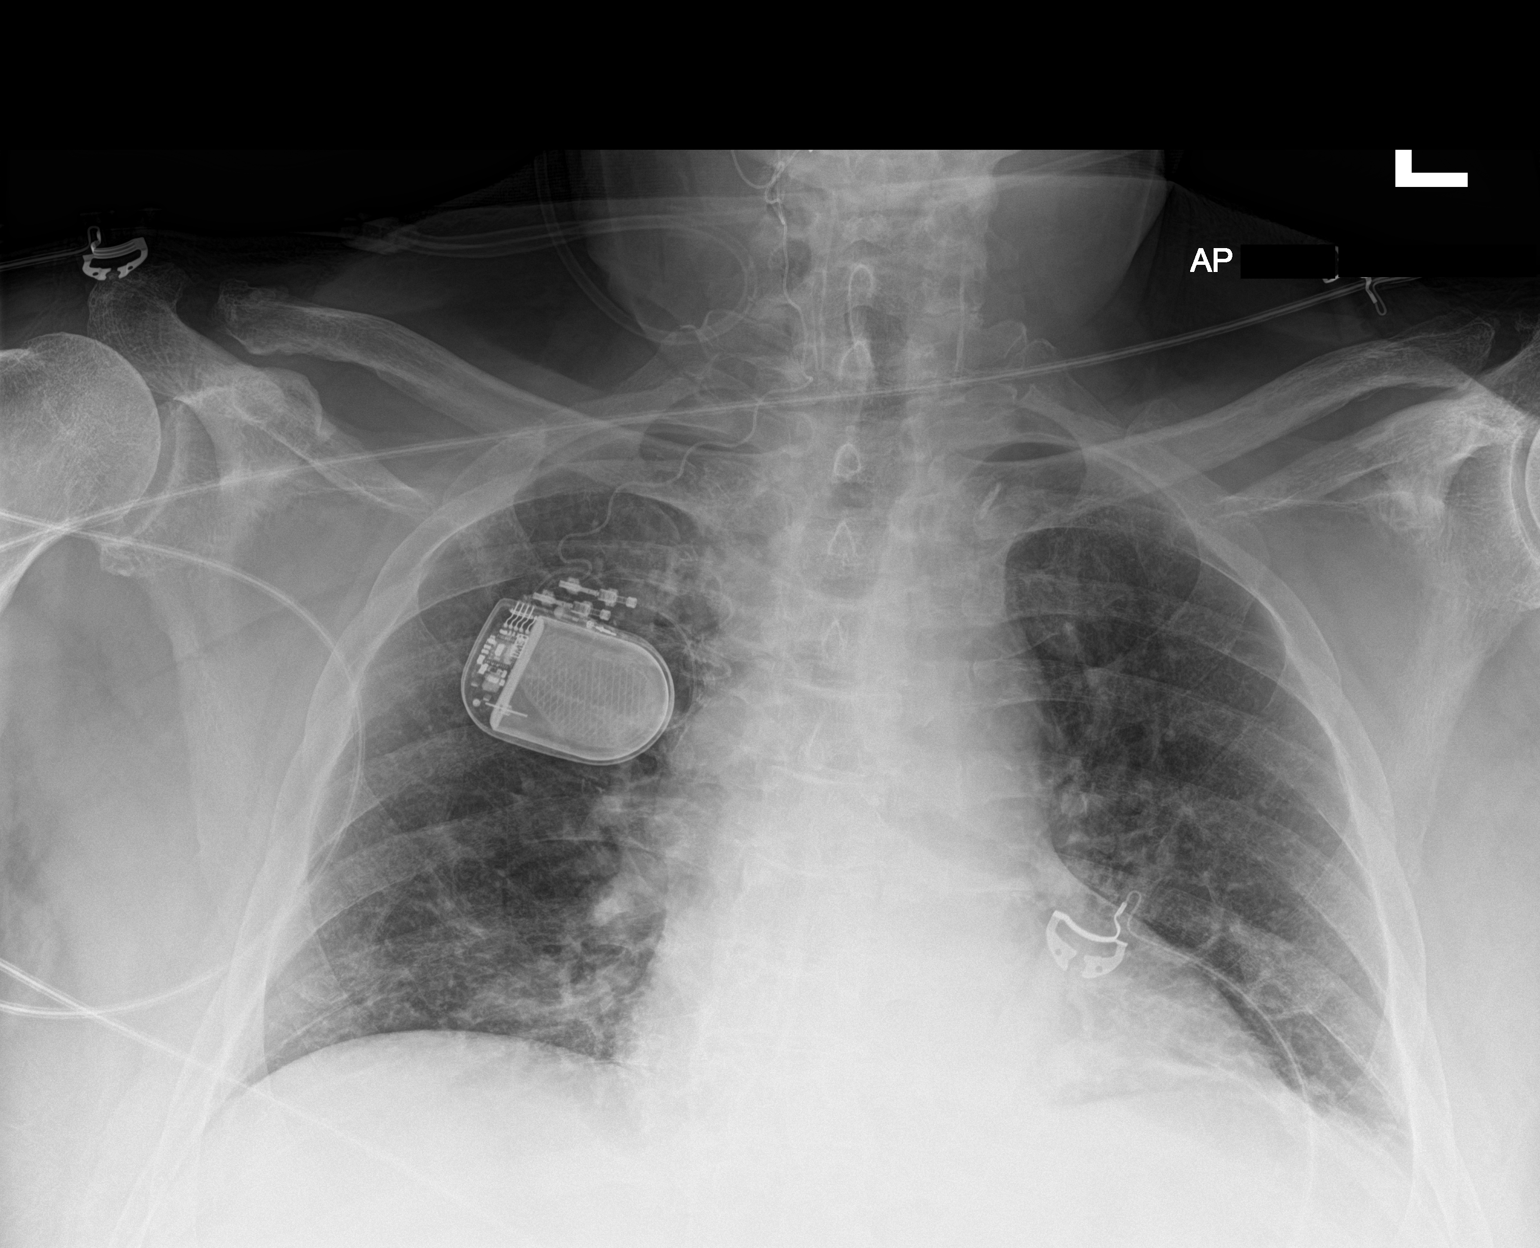

[1 of 1 positions shown; findings below may reference images not displayed]

FINDINGS: Generator pack over the RIGHT chest wall. A single lead extends into
the RIGHT neck to the angle of the jaw.

No pneumothorax.  No pulmonary edema.  Low lung volumes.
IMPRESSION: Low lung volumes.  No pneumothorax.  Stimulator device as above

## 2023-08-15 DIAGNOSIS — M9903 Segmental and somatic dysfunction of lumbar region: Secondary | ICD-10-CM | POA: Diagnosis not present

## 2023-08-15 DIAGNOSIS — M9901 Segmental and somatic dysfunction of cervical region: Secondary | ICD-10-CM | POA: Diagnosis not present

## 2023-08-15 DIAGNOSIS — M47816 Spondylosis without myelopathy or radiculopathy, lumbar region: Secondary | ICD-10-CM | POA: Diagnosis not present

## 2023-08-15 DIAGNOSIS — S134XXA Sprain of ligaments of cervical spine, initial encounter: Secondary | ICD-10-CM | POA: Diagnosis not present

## 2023-08-15 DIAGNOSIS — M9902 Segmental and somatic dysfunction of thoracic region: Secondary | ICD-10-CM | POA: Diagnosis not present

## 2023-08-15 DIAGNOSIS — S233XXA Sprain of ligaments of thoracic spine, initial encounter: Secondary | ICD-10-CM | POA: Diagnosis not present

## 2023-08-20 DIAGNOSIS — E113412 Type 2 diabetes mellitus with severe nonproliferative diabetic retinopathy with macular edema, left eye: Secondary | ICD-10-CM | POA: Diagnosis not present

## 2023-08-20 DIAGNOSIS — E113411 Type 2 diabetes mellitus with severe nonproliferative diabetic retinopathy with macular edema, right eye: Secondary | ICD-10-CM | POA: Diagnosis not present

## 2023-08-20 DIAGNOSIS — H35033 Hypertensive retinopathy, bilateral: Secondary | ICD-10-CM | POA: Diagnosis not present

## 2023-08-29 DIAGNOSIS — M9901 Segmental and somatic dysfunction of cervical region: Secondary | ICD-10-CM | POA: Diagnosis not present

## 2023-08-29 DIAGNOSIS — M47816 Spondylosis without myelopathy or radiculopathy, lumbar region: Secondary | ICD-10-CM | POA: Diagnosis not present

## 2023-08-29 DIAGNOSIS — S134XXA Sprain of ligaments of cervical spine, initial encounter: Secondary | ICD-10-CM | POA: Diagnosis not present

## 2023-08-29 DIAGNOSIS — M9903 Segmental and somatic dysfunction of lumbar region: Secondary | ICD-10-CM | POA: Diagnosis not present

## 2023-08-29 DIAGNOSIS — S233XXA Sprain of ligaments of thoracic spine, initial encounter: Secondary | ICD-10-CM | POA: Diagnosis not present

## 2023-08-29 DIAGNOSIS — M9902 Segmental and somatic dysfunction of thoracic region: Secondary | ICD-10-CM | POA: Diagnosis not present

## 2023-09-02 DIAGNOSIS — Z4542 Encounter for adjustment and management of neuropacemaker (brain) (peripheral nerve) (spinal cord): Secondary | ICD-10-CM | POA: Diagnosis not present

## 2023-09-02 DIAGNOSIS — G4733 Obstructive sleep apnea (adult) (pediatric): Secondary | ICD-10-CM | POA: Diagnosis not present

## 2023-09-07 ENCOUNTER — Ambulatory Visit: Payer: Self-pay | Admitting: Nurse Practitioner

## 2023-09-09 DIAGNOSIS — M9903 Segmental and somatic dysfunction of lumbar region: Secondary | ICD-10-CM | POA: Diagnosis not present

## 2023-09-09 DIAGNOSIS — M9902 Segmental and somatic dysfunction of thoracic region: Secondary | ICD-10-CM | POA: Diagnosis not present

## 2023-09-09 DIAGNOSIS — S233XXA Sprain of ligaments of thoracic spine, initial encounter: Secondary | ICD-10-CM | POA: Diagnosis not present

## 2023-09-09 DIAGNOSIS — M9901 Segmental and somatic dysfunction of cervical region: Secondary | ICD-10-CM | POA: Diagnosis not present

## 2023-09-09 DIAGNOSIS — M47816 Spondylosis without myelopathy or radiculopathy, lumbar region: Secondary | ICD-10-CM | POA: Diagnosis not present

## 2023-09-09 DIAGNOSIS — S134XXA Sprain of ligaments of cervical spine, initial encounter: Secondary | ICD-10-CM | POA: Diagnosis not present

## 2023-09-10 DIAGNOSIS — E113411 Type 2 diabetes mellitus with severe nonproliferative diabetic retinopathy with macular edema, right eye: Secondary | ICD-10-CM | POA: Diagnosis not present

## 2023-09-10 DIAGNOSIS — E113412 Type 2 diabetes mellitus with severe nonproliferative diabetic retinopathy with macular edema, left eye: Secondary | ICD-10-CM | POA: Diagnosis not present

## 2023-09-10 DIAGNOSIS — H35033 Hypertensive retinopathy, bilateral: Secondary | ICD-10-CM | POA: Diagnosis not present

## 2023-09-11 ENCOUNTER — Telehealth: Payer: Self-pay | Admitting: Nurse Practitioner

## 2023-09-11 NOTE — Telephone Encounter (Signed)
 Routed most recent labs to PCP office

## 2023-09-11 NOTE — Telephone Encounter (Signed)
 Pt's PCP requesting most recent labs be sent to their office. Please advise

## 2023-09-26 DIAGNOSIS — M9903 Segmental and somatic dysfunction of lumbar region: Secondary | ICD-10-CM | POA: Diagnosis not present

## 2023-09-26 DIAGNOSIS — M47816 Spondylosis without myelopathy or radiculopathy, lumbar region: Secondary | ICD-10-CM | POA: Diagnosis not present

## 2023-09-26 DIAGNOSIS — S134XXA Sprain of ligaments of cervical spine, initial encounter: Secondary | ICD-10-CM | POA: Diagnosis not present

## 2023-09-26 DIAGNOSIS — S233XXA Sprain of ligaments of thoracic spine, initial encounter: Secondary | ICD-10-CM | POA: Diagnosis not present

## 2023-09-26 DIAGNOSIS — M9901 Segmental and somatic dysfunction of cervical region: Secondary | ICD-10-CM | POA: Diagnosis not present

## 2023-09-26 DIAGNOSIS — M9902 Segmental and somatic dysfunction of thoracic region: Secondary | ICD-10-CM | POA: Diagnosis not present

## 2023-09-27 DIAGNOSIS — R809 Proteinuria, unspecified: Secondary | ICD-10-CM | POA: Diagnosis not present

## 2023-09-27 DIAGNOSIS — E1129 Type 2 diabetes mellitus with other diabetic kidney complication: Secondary | ICD-10-CM | POA: Diagnosis not present

## 2023-10-02 ENCOUNTER — Encounter: Payer: Self-pay | Admitting: Urology

## 2023-10-02 ENCOUNTER — Ambulatory Visit: Admitting: Urology

## 2023-10-02 VITALS — BP 116/73 | HR 84

## 2023-10-02 DIAGNOSIS — N138 Other obstructive and reflux uropathy: Secondary | ICD-10-CM | POA: Diagnosis not present

## 2023-10-02 DIAGNOSIS — R339 Retention of urine, unspecified: Secondary | ICD-10-CM

## 2023-10-02 DIAGNOSIS — N5201 Erectile dysfunction due to arterial insufficiency: Secondary | ICD-10-CM

## 2023-10-02 DIAGNOSIS — N529 Male erectile dysfunction, unspecified: Secondary | ICD-10-CM | POA: Diagnosis not present

## 2023-10-02 DIAGNOSIS — N401 Enlarged prostate with lower urinary tract symptoms: Secondary | ICD-10-CM

## 2023-10-02 LAB — URINALYSIS, ROUTINE W REFLEX MICROSCOPIC
Bilirubin, UA: NEGATIVE
Leukocytes,UA: NEGATIVE
Nitrite, UA: NEGATIVE
Protein,UA: NEGATIVE
RBC, UA: NEGATIVE
Specific Gravity, UA: 1.01 (ref 1.005–1.030)
Urobilinogen, Ur: 0.2 mg/dL (ref 0.2–1.0)
pH, UA: 6 (ref 5.0–7.5)

## 2023-10-02 LAB — BLADDER SCAN AMB NON-IMAGING: Scan Result: 98

## 2023-10-02 MED ORDER — TADALAFIL 20 MG PO TABS: 20.0000 mg | ORAL_TABLET | ORAL | 5 refills | Status: AC | PRN

## 2023-10-02 MED ORDER — FINASTERIDE 5 MG PO TABS: 5.0000 mg | ORAL_TABLET | Freq: Every day | ORAL | 3 refills | Status: AC

## 2023-10-02 MED ORDER — SILODOSIN 8 MG PO CAPS: 8.0000 mg | ORAL_CAPSULE | Freq: Every evening | ORAL | 11 refills | Status: DC

## 2023-10-02 NOTE — Progress Notes (Signed)
 10/02/2023 8:48 AM   [name] 12-Nov-1955 [id number]  Referring provider: Lari Elspeth BRAVO, MD [address] [place],  [place] [address]  Followup BPH   HPI: [name] is a 68yo here for followup for BPh with incomplete emptying. PVR 98cc. IPSS 4 QOl 3 on rapaflo  8mg  and finasteride  5mg . Urine stream strong. Nocturia 1x. He has issues getting and maintaining an erection. He has never tried [employer] Good libido. The issues have been present for several years.    PMH: Past Medical History:  Diagnosis Date   Arthritis    generalized   Coronary artery disease    stents placed 2003   Hyperlipidemia    Hypertension    Sleep apnea    on CPAP    Surgical History: Past Surgical History:  Procedure Laterality Date   BARIATRIC SURGERY  12/2015   BICEPS TENDON REPAIR     CARDIAC CATHETERIZATION  2000   DRUG INDUCED ENDOSCOPY N/A 08/26/2020   Procedure: DRUG INDUCED ENDOSCOPY;  Surgeon: Levi Lenis, MD;  Location: [facility];  Service: ENT;  Laterality: N/A;   IMPLANTATION OF HYPOGLOSSAL NERVE STIMULATOR Right 01/25/2021   Procedure: IMPLANTATION OF HYPOGLOSSAL NERVE STIMULATOR;  Surgeon: Levi Lenis, MD;  Location: [facility] OR;  Service: ENT;  Laterality: Right;   KNEE ARTHROSCOPY Bilateral    septorhinoplasty     SHOULDER ARTHROSCOPY Bilateral    TONSILLECTOMY     UVULLA REMOVAL      Home Medications:  Allergies as of 10/02/2023   No Known Allergies      Medication List        Accurate as of October 02, 2023  8:48 AM. If you have any questions, ask your nurse or doctor.          aspirin 81 MG tablet Take 81 mg by mouth daily.   b complex vitamins tablet Take 1 tablet by mouth daily.   Calcium  500-100 MG-UNIT Chew Chew 1 tablet by mouth 4 (four) times daily.   cyanocobalamin  1000 MCG tablet Commonly known as: VITAMIN B12 Take 1,000 mcg by mouth daily.   ferrous sulfate 325 (65 FE) MG EC tablet Take 325 mg by mouth daily.   finasteride  5 MG  tablet Commonly known as: PROSCAR  Take 1 tablet (5 mg total) by mouth daily.   icosapent Ethyl 1 g capsule Commonly known as: VASCEPA Take 1 g by mouth 2 (two) times daily at 10 AM and 5 PM.   Jardiance 25 MG Tabs tablet Generic drug: empagliflozin Take 12.5 mg by mouth daily.   metFORMIN 850 MG tablet Commonly known as: GLUCOPHAGE Take 850 mg by mouth 2 (two) times daily with a meal.   metoprolol  succinate 25 MG 24 hr tablet Commonly known as: TOPROL -XL TAKE 1 TABLET BY MOUTH  DAILY   midodrine  2.5 MG tablet Commonly known as: PROAMATINE  Take 1 tablet (2.5 mg total) by mouth 3 (three) times daily as needed (for a blood pressure reading less than 90/60).   multivitamin tablet Take 1 tablet by mouth 2 (two) times daily.   OMEGA-3 FISH OIL PO Take 1 tablet by mouth 2 (two) times daily at 10 AM and 5 PM.   pantoprazole 40 MG tablet Commonly known as: PROTONIX Take 40 mg by mouth daily.   potassium chloride SA 20 MEQ tablet Commonly known as: KLOR-CON M Take 20 mEq by mouth 2 (two) times daily.   ramipril  2.5 MG capsule Commonly known as: ALTACE  TAKE 1 CAPSULE BY  MOUTH  DAILY   rosuvastatin  5 MG tablet Commonly known as: CRESTOR  Take 1 tablet (5 mg total) by mouth daily.   silodosin  8 MG Caps capsule Commonly known as: RAPAFLO  Take 1 capsule (8 mg total) by mouth at bedtime.   SYSTANE OP Place 1 drop into both eyes daily as needed (dry eyes).   Vitamin D 50 MCG (2000 UT) tablet Take 6,000 Units by mouth daily.        Allergies: No Known Allergies  Family History: Family History  Problem Relation Age of Onset   Diabetes Mother    Diabetes Father    Heart attack Father     Social History:  reports that he has never smoked. He has never used smokeless tobacco. He reports that he does not currently use alcohol . He reports that he does not use drugs.  ROS: All other review of systems were reviewed and are negative except what is noted above in  HPI  Physical Exam: BP 116/73   Pulse 84   Constitutional:  Alert and oriented, No acute distress. HEENT: [place] AT, moist mucus membranes.  Trachea midline, no masses. Cardiovascular: No clubbing, cyanosis, or edema. Respiratory: Normal respiratory effort, no increased work of breathing. GI: Abdomen is soft, nontender, nondistended, no abdominal masses GU: No CVA tenderness.  Lymph: No cervical or inguinal lymphadenopathy. Skin: No rashes, bruises or suspicious lesions. Neurologic: Grossly intact, no focal deficits, moving all 4 extremities. Psychiatric: Normal mood and affect.  Laboratory Data: Lab Results  Component Value Date   WBC 8.5 01/18/2021   HGB 12.0 (L) 01/18/2021   HCT 37.8 (L) 01/18/2021   MCV 90.9 01/18/2021   PLT 159 01/18/2021    Lab Results  Component Value Date   CREATININE 0.79 07/30/2023    No results found for: PSA  No results found for: TESTOSTERONE  Lab Results  Component Value Date   HGBA1C (H) 02/19/2007    8.5 (NOTE)   The ADA recommends the following therapeutic goals for glycemic   control related to Hgb A1C measurement:   Goal of Therapy:   < 7.0% Hgb A1C   Action Suggested:  > 8.0% Hgb A1C   Ref:  Diabetes Care, 22, Suppl. 1, 1999    Urinalysis    Component Value Date/Time   APPEARANCEUR Clear 07/03/2023 0855   GLUCOSEU 3+ (A) 07/03/2023 0855   BILIRUBINUR Negative 07/03/2023 0855   PROTEINUR Negative 07/03/2023 0855   NITRITE Negative 07/03/2023 0855   LEUKOCYTESUR Negative 07/03/2023 0855    Lab Results  Component Value Date   LABMICR Comment 07/03/2023   WBCUA 0-5 07/18/2021   LABEPIT 0-10 07/18/2021   BACTERIA Few (A) 07/18/2021    Pertinent Imaging:  No results found for this or any previous visit.  No results found for this or any previous visit.  No results found for this or any previous visit.  No results found for this or any previous visit.  No results found for this or any previous visit.  No results  found for this or any previous visit.  No results found for this or any previous visit.  No results found for this or any previous visit.   Assessment & Plan:    1. Incomplete bladder emptying (Primary) Continue rapaflo  8mg  and finasteride  5mg  daily - Urinalysis, Routine w reflex microscopic - BLADDER SCAN AMB NON-IMAGING  2. BPH with obstruction/lower urinary tract symptoms Continue rapaflo  8mg  and finasteride  5mg  daily  3. Erectile dysfunction -we will trial tadalafil   5mg  daily   No follow-ups on file.  Belvie Clara, MD  [facility]

## 2023-10-02 NOTE — Progress Notes (Signed)
 Bladder Scan completed today.  Patient can void prior to the bladder scan. Bladder scan result: 98  Performed By: Calia Napp Lpn

## 2023-10-02 NOTE — Patient Instructions (Signed)

## 2023-10-04 DIAGNOSIS — E1142 Type 2 diabetes mellitus with diabetic polyneuropathy: Secondary | ICD-10-CM | POA: Diagnosis not present

## 2023-10-04 DIAGNOSIS — E1165 Type 2 diabetes mellitus with hyperglycemia: Secondary | ICD-10-CM | POA: Diagnosis not present

## 2023-10-04 DIAGNOSIS — E1129 Type 2 diabetes mellitus with other diabetic kidney complication: Secondary | ICD-10-CM | POA: Diagnosis not present

## 2023-10-04 DIAGNOSIS — E1169 Type 2 diabetes mellitus with other specified complication: Secondary | ICD-10-CM | POA: Diagnosis not present

## 2023-10-04 DIAGNOSIS — R809 Proteinuria, unspecified: Secondary | ICD-10-CM | POA: Diagnosis not present

## 2023-10-04 DIAGNOSIS — E785 Hyperlipidemia, unspecified: Secondary | ICD-10-CM | POA: Diagnosis not present

## 2023-10-22 DIAGNOSIS — E113313 Type 2 diabetes mellitus with moderate nonproliferative diabetic retinopathy with macular edema, bilateral: Secondary | ICD-10-CM | POA: Diagnosis not present

## 2023-10-22 DIAGNOSIS — K760 Fatty (change of) liver, not elsewhere classified: Secondary | ICD-10-CM | POA: Diagnosis not present

## 2023-10-22 DIAGNOSIS — E876 Hypokalemia: Secondary | ICD-10-CM | POA: Diagnosis not present

## 2023-10-22 DIAGNOSIS — I2510 Atherosclerotic heart disease of native coronary artery without angina pectoris: Secondary | ICD-10-CM | POA: Diagnosis not present

## 2023-10-22 DIAGNOSIS — Z6830 Body mass index (BMI) 30.0-30.9, adult: Secondary | ICD-10-CM | POA: Diagnosis not present

## 2023-12-16 DIAGNOSIS — E113411 Type 2 diabetes mellitus with severe nonproliferative diabetic retinopathy with macular edema, right eye: Secondary | ICD-10-CM | POA: Diagnosis not present

## 2023-12-16 DIAGNOSIS — E113412 Type 2 diabetes mellitus with severe nonproliferative diabetic retinopathy with macular edema, left eye: Secondary | ICD-10-CM | POA: Diagnosis not present

## 2023-12-16 DIAGNOSIS — H35033 Hypertensive retinopathy, bilateral: Secondary | ICD-10-CM | POA: Diagnosis not present

## 2023-12-20 ENCOUNTER — Other Ambulatory Visit (HOSPITAL_COMMUNITY): Payer: PPO

## 2023-12-20 ENCOUNTER — Ambulatory Visit (HOSPITAL_COMMUNITY)
Admission: RE | Admit: 2023-12-20 | Discharge: 2023-12-20 | Disposition: A | Discharge status: Home / Self Care | Payer: PPO | Source: Ambulatory Visit | Attending: General Practice | Admitting: General Practice

## 2023-12-20 ENCOUNTER — Ambulatory Visit: Payer: Self-pay | Admitting: General Practice

## 2023-12-20 DIAGNOSIS — I10 Essential (primary) hypertension: Secondary | ICD-10-CM | POA: Diagnosis not present

## 2023-12-20 DIAGNOSIS — R5383 Other fatigue: Secondary | ICD-10-CM | POA: Insufficient documentation

## 2023-12-20 LAB — ECHOCARDIOGRAM COMPLETE
Area-P 1/2: 3.34 cm2
S' Lateral: 3.1 cm

## 2023-12-20 NOTE — Progress Notes (Signed)
*  PRELIMINARY RESULTS* Echocardiogram 2D Echocardiogram has been performed.  [name] 12/20/2023, 10:55 AM

## 2023-12-20 NOTE — Telephone Encounter (Signed)
 Patient is returning call.

## 2023-12-23 NOTE — Progress Notes (Signed)
 Spoke with patient. He has not taken the bp meds in 6 months. Will continue to hold. He has been scheduled for follow up for 11/10 with [facility]. He does not monitor bp as regularly as he should however, pt says he knows his bp is under control based on how he feels

## 2023-12-26 DIAGNOSIS — E1129 Type 2 diabetes mellitus with other diabetic kidney complication: Secondary | ICD-10-CM | POA: Diagnosis not present

## 2023-12-26 DIAGNOSIS — R809 Proteinuria, unspecified: Secondary | ICD-10-CM | POA: Diagnosis not present

## 2023-12-27 DIAGNOSIS — E113313 Type 2 diabetes mellitus with moderate nonproliferative diabetic retinopathy with macular edema, bilateral: Secondary | ICD-10-CM | POA: Diagnosis not present

## 2023-12-27 DIAGNOSIS — E1165 Type 2 diabetes mellitus with hyperglycemia: Secondary | ICD-10-CM | POA: Diagnosis not present

## 2023-12-27 DIAGNOSIS — N181 Chronic kidney disease, stage 1: Secondary | ICD-10-CM | POA: Diagnosis not present

## 2023-12-27 DIAGNOSIS — Z9884 Bariatric surgery status: Secondary | ICD-10-CM | POA: Diagnosis not present

## 2023-12-27 DIAGNOSIS — E785 Hyperlipidemia, unspecified: Secondary | ICD-10-CM | POA: Diagnosis not present

## 2023-12-27 DIAGNOSIS — E1142 Type 2 diabetes mellitus with diabetic polyneuropathy: Secondary | ICD-10-CM | POA: Diagnosis not present

## 2023-12-27 DIAGNOSIS — E1129 Type 2 diabetes mellitus with other diabetic kidney complication: Secondary | ICD-10-CM | POA: Diagnosis not present

## 2023-12-27 DIAGNOSIS — R809 Proteinuria, unspecified: Secondary | ICD-10-CM | POA: Diagnosis not present

## 2023-12-27 DIAGNOSIS — E1169 Type 2 diabetes mellitus with other specified complication: Secondary | ICD-10-CM | POA: Diagnosis not present

## 2023-12-27 DIAGNOSIS — I129 Hypertensive chronic kidney disease with stage 1 through stage 4 chronic kidney disease, or unspecified chronic kidney disease: Secondary | ICD-10-CM | POA: Diagnosis not present

## 2023-12-27 DIAGNOSIS — E1159 Type 2 diabetes mellitus with other circulatory complications: Secondary | ICD-10-CM | POA: Diagnosis not present

## 2023-12-27 DIAGNOSIS — E1122 Type 2 diabetes mellitus with diabetic chronic kidney disease: Secondary | ICD-10-CM | POA: Diagnosis not present

## 2024-01-01 ENCOUNTER — Ambulatory Visit: Admitting: General Practice

## 2024-01-09 DIAGNOSIS — H40011 Open angle with borderline findings, low risk, right eye: Secondary | ICD-10-CM | POA: Diagnosis not present

## 2024-01-09 DIAGNOSIS — H524 Presbyopia: Secondary | ICD-10-CM | POA: Diagnosis not present

## 2024-01-20 ENCOUNTER — Encounter: Payer: Self-pay | Admitting: Nurse Practitioner

## 2024-01-20 ENCOUNTER — Ambulatory Visit: Attending: Nurse Practitioner | Admitting: Nurse Practitioner

## 2024-01-20 VITALS — BP 132/74 | HR 80 | Ht 71.0 in | Wt 225.6 lb

## 2024-01-20 DIAGNOSIS — I2510 Atherosclerotic heart disease of native coronary artery without angina pectoris: Secondary | ICD-10-CM

## 2024-01-20 DIAGNOSIS — E785 Hyperlipidemia, unspecified: Secondary | ICD-10-CM

## 2024-01-20 DIAGNOSIS — I10 Essential (primary) hypertension: Secondary | ICD-10-CM | POA: Diagnosis not present

## 2024-01-20 DIAGNOSIS — I9581 Postprocedural hypotension: Secondary | ICD-10-CM | POA: Diagnosis not present

## 2024-01-20 DIAGNOSIS — R748 Abnormal levels of other serum enzymes: Secondary | ICD-10-CM

## 2024-01-20 DIAGNOSIS — R42 Dizziness and giddiness: Secondary | ICD-10-CM

## 2024-01-20 DIAGNOSIS — R232 Flushing: Secondary | ICD-10-CM

## 2024-01-20 DIAGNOSIS — K828 Other specified diseases of gallbladder: Secondary | ICD-10-CM | POA: Diagnosis not present

## 2024-01-20 NOTE — Progress Notes (Unsigned)
 Cardiology Office Note:  .   Date:  08/06/2023 ID:  [name], DOB 1955/03/29, MRN [id number] PCP: Levi Elspeth BRAVO, MD  [facility] Providers Cardiologist:  Levi Carrier, MD    History of Present Illness: .   [name] is a 68 y.o. male with a PMH of CAD, hypertension, type 2 diabetes, hyperlipidemia, obesity, who presents today for follow-up.   Last seen by Levi Beauvais, NP on November 28, 2022 for chest discomfort and lower extremity swelling evaluation.  Patient reported developing body aches, fatigue, lower extremity swelling, loss of appetite after receiving the shingles vaccine.  Patient received antibiotic treatment.  He noted his leg swelling improved.  Echocardiogram was arranged and revealed normal EF, no significant valvular abnormalities, mildly dilated aortic root.  I last saw him for follow-up on March 01, 2023. Stated he underwent gall bladder surgery at [facility] earlier this year. Since recovering from this surgery and returning to working out, he reported episodes of BP dropping and fluctuating. Had to hold off on working out as he has episodes of feeling weak/feeling flushed when his BP drops. Denied any chest pain, shortness of breath, palpitations, syncope, presyncope, dizziness, orthopnea, PND, swelling or significant weight changes, acute bleeding, or claudication. Recent BP log shows normal readings.  Reported he had gastric bypass surgery around 4 years ago.   06/03/2023 -presents today for follow-up.  Says he is doing similar to last office visit.  Says as long as he is not working out, denies any recurrent episodes of feeling flushed. Denies any chest pain, shortness of breath, palpitations, syncope, presyncope, dizziness, orthopnea, PND, swelling or significant weight changes, acute bleeding, or claudication.  Did have an episode at Dr. Deniece office for BP dropped to 80/60 and was symptomatic per his report.  Says he has had a CT scan performed  last month that was arranged by his PCP that revealed cystic focus in gallbladder fossa compatible with remnant gallbladder per indication and is intimately associated with the proximal duodenum possibly reflecting adhesion.  Other findings on CT scan of abdomen/pelvis included diffuse hepatic steatosis, prior gastric surgery with duodenal switch procedure, no other acute findings. Says he continues to have episodes of low blood pressure ever since his GI surgery.   08/06/2023 - Doing well do about the same since I last saw him.  Continues to not be as active due to his blood pressures.  Continues to hold metoprolol  succinate, ramipril , and rosuvastatin . Denies any chest pain, shortness of breath, palpitations, syncope, presyncope, dizziness, orthopnea, PND, swelling or significant weight changes, acute bleeding, or claudication.  ROS: Negative. See HPI.   Studies Reviewed: SABRA    EKG:       Carotid duplex 04/2023:  Summary:  Right Carotid: Velocities in the right ICA are consistent with a 1-39%  stenosis. Non-hemodynamically significant plaque <50% noted in the  CCA. The ECA appears <50% stenosed.   Left Carotid: Velocities in the left ICA are consistent with a 1-39%  stenosis. Non-hemodynamically significant plaque <50% noted in the  CCA. The ECA appears <50% stenosed.   Vertebrals:  Bilateral vertebral arteries demonstrate antegrade flow.  Subclavians: Normal flow hemodynamics were seen in bilateral subclavian arteries.   Cardiac monitor 02/2023: Patch Wear Time:  4 days and 6 hours (2024-12-20T09:52:52-0500 to 2024-12-24T15:56:53-0500)   Patient had a min HR of 45 bpm, max HR of 145 bpm, and avg HR of 87 bpm. Predominant underlying rhythm was Sinus Rhythm. Slight P wave morphology changes  were noted. 1 run of Ventricular Tachycardia occurred lasting 5 beats with a max rate of 140 bpm  (avg 106 bpm). 4 Supraventricular Tachycardia runs occurred, the run with the fastest interval lasting 4  beats with a max rate of 143 bpm, the longest lasting 21.1 secs with an avg rate of 135 bpm. Second Degree AV Block-Mobitz I (Wenckebach) was  present. Isolated SVEs were occasional (1.1%, 4331), SVE Couplets were rare (<1.0%, 87), and SVE Triplets were rare (<1.0%, 88). Isolated VEs were rare (<1.0%), VE Couplets were rare (<1.0%), and no VE Triplets were present. Ventricular Bigeminy was  present.  Echo 12/2022:  1. Left ventricular ejection fraction, by estimation, is 60 to 65%. The  left ventricle has normal function. The left ventricle has no regional  wall motion abnormalities. Left ventricular diastolic parameters were  normal.   2. Right ventricular systolic function is mildly reduced. The right  ventricular size is mildly enlarged.   3. Left atrial size was mildly dilated.   4. The mitral valve is normal in structure. No evidence of mitral valve  regurgitation. No evidence of mitral stenosis.   5. The aortic valve is tricuspid. There is mild calcification of the  aortic valve. Aortic valve regurgitation is not visualized. Aortic valve  sclerosis is present, with no evidence of aortic valve stenosis.   6. Aortic dilatation noted. There is mild dilatation of the aortic root,  measuring 39 mm.   7. The inferior vena cava is normal in size with greater than 50%  respiratory variability, suggesting right atrial pressure of 3 mmHg.  Lexiscan  08/2015: Blood pressure demonstrated a hypertensive response to exercise. There was no ST segment deviation noted during stress. Defect 1: There is a small defect of mild severity present in the mid inferior, mid inferolateral and apical inferior location. This is consistent with soft tissue attenuation artifact. No areas of ischemia or infarction. This is a low risk study. Nuclear stress EF: 68%.  Physical Exam:   VS:  There were no vitals taken for this visit.   Wt Readings from Last 3 Encounters:  08/06/23 218 lb (98.9 kg)  06/03/23 220 lb  6.4 oz (100 kg)  04/08/23 222 lb 3.2 oz (100.8 kg)    GEN: Obese, 68 y.o. male in no acute distress NECK: No JVD; No carotid bruits CARDIAC: S1/S2, RRR, no murmurs, rubs, gallops RESPIRATORY:  Clear to auscultation without rales, wheezing or rhonchi  ABDOMEN: Soft, non-tender, non-distended EXTREMITIES:  No edema; No deformity   ASSESSMENT AND PLAN: .    CAD Denies any chest pains. Stable with no anginal symptoms. No indication for ischemic evaluation. NST in 2017 was low risk. Continue Aspirin.  Will restart rosuvastatin  at 5 mg daily, had LFT elevations with increased dose.  Continue to hold Toprol  XL and ramipril . Heart healthy diet encouraged. Care and ED precautions discussed.   HTN BP soft today. Previous orthostatics negative in office, however has noticed BP drops per his report. Past cardiac workup benign. No medication changes at this time. Will prescribe midodrine  PRN as noted below.  Given BP log and instructed to monitor BP during episodes. Discussed to monitor BP at home at least 2 hours after medications and sitting for 5-10 minutes.   HLD, medication management Most recent FLP revealed LDL was slightly elevated. Most recent liver enzymes showed normalization of AST/ALT. Will restart Crestor  at 5 mg daily and repeat FLP/LFT in 2-3 months.  Continue to follow with PCP.  4. Lightheadedness, flushing,  hypotension Etiology unclear, however does appear his blood pressure dropping seems to be a component as well as hx of past GI surgery.  Patient said he has had no issues with his BP until after he had his GI surgery.  Past orthostatics are negative.  Overall his cardiac workup does not explain his symptoms. Conservative measures previously discussed.  Care and ED precautions discussed. Will begin midodrine  2.5 mg 3 times daily as needed for BP < 90/60.  Patient verbalized understanding.  5.  Aortic root dilatation Echocardiogram in October 2024 showed mildly dilated aortic root at  39 mm.  He is scheduled for echocardiogram in October this year. Will continue to monitor.   5. Gallbladder adhesions CT scan from [facility] in February 2025 revealed cystic focus in gallbladder fossa compatible with remnant gallbladder per indication and is intimately associated with the proximal duodenum possibly reflecting adhesion. Recommended to f/u with PCP and GI for further evaluation.    Dispo: Care and ED precautions discussed. Follow-up with me/APP in 6 months or sooner if anything changes.   Signed, Almarie Crate, NP

## 2024-01-20 NOTE — Patient Instructions (Signed)
 Medication Instructions:  Your physician recommends that you continue on your current medications as directed. Please refer to the Current Medication list given to you today.  *If you need a refill on your cardiac medications before your next appointment, please call your pharmacy*  Lab Work: None If you have labs (blood work) drawn today and your tests are completely normal, you will receive your results only by: MyChart Message (if you have MyChart) OR A paper copy in the mail If you have any lab test that is abnormal or we need to change your treatment, we will call you to review the results.  Testing/Procedures: None  Follow-Up: At [facility], you and your health needs are our priority.  As part of our continuing mission to provide you with exceptional heart care, our providers are all part of one team.  This team includes your primary Cardiologist (physician) and Advanced Practice Providers or APPs (Physician Assistants and Nurse Practitioners) who all work together to provide you with the care you need, when you need it.  Your next appointment:   6 month(s)  Provider:   You may see Dina Rich, MD or the following Advanced Practice Provider on your designated Care Team:   Sharlene Dory, NP    We recommend signing up for the patient portal called "MyChart".  Sign up information is provided on this After Visit Summary.  MyChart is used to connect with patients for Virtual Visits (Telemedicine).  Patients are able to view lab/test results, encounter notes, upcoming appointments, etc.  Non-urgent messages can be sent to your provider as well.   To learn more about what you can do with MyChart, go to [email or web address].   Other Instructions

## 2024-04-03 ENCOUNTER — Ambulatory Visit: Admitting: Urology

## 2024-04-03 VITALS — BP 130/78 | HR 92

## 2024-04-03 DIAGNOSIS — R338 Other retention of urine: Secondary | ICD-10-CM

## 2024-04-03 DIAGNOSIS — N401 Enlarged prostate with lower urinary tract symptoms: Secondary | ICD-10-CM | POA: Diagnosis not present

## 2024-04-03 DIAGNOSIS — N138 Other obstructive and reflux uropathy: Secondary | ICD-10-CM

## 2024-04-03 DIAGNOSIS — R351 Nocturia: Secondary | ICD-10-CM

## 2024-04-03 DIAGNOSIS — N529 Male erectile dysfunction, unspecified: Secondary | ICD-10-CM | POA: Diagnosis not present

## 2024-04-03 DIAGNOSIS — R339 Retention of urine, unspecified: Secondary | ICD-10-CM

## 2024-04-03 LAB — URINALYSIS, ROUTINE W REFLEX MICROSCOPIC
Bilirubin, UA: NEGATIVE
Ketones, UA: NEGATIVE
Leukocytes,UA: NEGATIVE
Nitrite, UA: NEGATIVE
Protein,UA: NEGATIVE
RBC, UA: NEGATIVE
Specific Gravity, UA: 1.01 (ref 1.005–1.030)
Urobilinogen, Ur: 0.2 mg/dL (ref 0.2–1.0)
pH, UA: 6.5 (ref 5.0–7.5)

## 2024-04-03 LAB — BLADDER SCAN AMB NON-IMAGING: Scan Result: 195

## 2024-04-03 MED ORDER — SILODOSIN 8 MG PO CAPS: 8.0000 mg | ORAL_CAPSULE | Freq: Every evening | ORAL | 11 refills | Status: AC

## 2024-04-03 MED ORDER — CIPROFLOXACIN HCL 500 MG PO TABS: 500.0000 mg | ORAL_TABLET | Freq: Once | ORAL | Status: AC

## 2024-04-03 NOTE — Patient Instructions (Signed)
 Transurethral Resection of the Prostate Transurethral resection of the prostate (TURP) is the removal, or resection, of part of the prostate tissue. This procedure is done to treat an enlarged prostate gland (benign prostatic hyperplasia). The goal of TURP is to remove enough prostate tissue to allow for a normal flow of urine. The procedure will allow you to empty your bladder more completely when you urinate so that you can urinate less often. In a transurethral resection, a thin telescope with a light, a camera, and an electric cutting edge (resectoscope) is passed through the urethra and into the prostate. The opening of the urethra is at the end of the penis. Tell a health care provider about: Any allergies you have. All medicines you are taking, including vitamins, herbs, eye drops, creams, and over-the-counter medicines. Any problems you or family members have had with anesthetic medicines. Any bleeding problems you have. Any surgeries you have had. Any medical conditions you have. Any prostate infections you have had. What are the risks? Generally, this is a safe procedure. However, problems may occur, including: Infection. Bleeding. Allergic reactions to medicines. Blood in the urine (hematuria). Damage to nearby structures or organs. Other problems may occur, but they are rare. They include: Dry ejaculation, or having no semen come out during orgasm. Erectile dysfunction, or being unable to have or keep an erection. Scarring that leads to narrowing of the urethra. This narrowing may block the flow of urine. Inability to control when you urinate (incontinence). Deep vein thrombosis. This is a blood clot that can develop in your leg. TURP syndrome. This can happen when you lose too much sodium during or after the procedure. Some signs and symptoms of this condition include: Weakness. Headaches. Nausea or vomiting. Muscle cramping. What happens before the procedure? When to stop  eating and drinking Follow instructions from your health care provider about what you may eat and drink before your procedure. These may include: 8 hours before your procedure Stop eating most foods. Do not eat meat, fried foods, or fatty foods. Eat only light foods, such as toast or crackers. All liquids are okay except energy drinks and alcohol. 6 hours before your procedure Stop eating. Drink only clear liquids, such as water, clear fruit juice, black coffee, plain tea, and sports drinks. Do not drink energy drinks or alcohol. 2 hours before your procedure Stop drinking all liquids. You may be allowed to take medicines with small sips of water. If you do not follow your health care provider's instructions, your procedure may be delayed or canceled. Medicines Ask your health care provider about: Changing or stopping your regular medicines. This is especially important if you are taking diabetes medicines or blood thinners. Taking medicines such as aspirin and ibuprofen. These medicines can thin your blood. Do not take these medicines unless your health care provider tells you to take them. Taking over-the-counter medicines, vitamins, herbs, and supplements. Surgery safety Ask your health care provider what steps will be taken to help prevent infection. These steps may include: Removing hair at the surgery site. Washing skin with a germ-killing soap. Taking antibiotic medicine. General instructions Do not use any products that contain nicotine or tobacco for at least 4 weeks before the procedure. These products include cigarettes, chewing tobacco, and vaping devices, such as e-cigarettes. If you need help quitting, ask your health care provider. If you will be going home right after the procedure, plan to have a responsible adult: Take you home from the hospital or clinic. You  will not be allowed to drive. Care for you for the time you are told. What happens during the procedure?  An  IV will be inserted into one of your veins. You will be given one or more of the following: A medicine to help you relax (sedative). A medicine to make you fall asleep (general anesthetic). A medicine that is injected into your spine to numb the area below and slightly above the injection site (spinal anesthetic). Your legs will be placed in foot rests (stirrups) so that your legs are apart and your knees are bent. The resectoscope will be passed through your urethra to your prostate. Parts of your prostate will be resected using the cutting edge of the resectoscope. Fluid will be passed to rinse out the cut tissues (irrigation). The resectoscope will be removed. A small, thin tube (catheter) will be passed through your urethra and into your bladder. The catheter will drain urine into a bag outside of your body. The procedure may vary among health care providers and hospitals. What happens after the procedure? Your blood pressure, heart rate, breathing rate, and blood oxygen level will be monitored until you leave the hospital or clinic. You will be given fluids through the IV. The IV will be removed when you start eating and drinking normally. You may have some pain. Pain medicine will be available to help you. You will have a catheter draining your urine. You may have blood in your urine. Your catheter may be kept in until your urine is clear. Your urinary drainage will be monitored. If necessary, your bladder may be rinsed out (irrigated) through your catheter. You will be encouraged to walk around as soon as possible. You may have to wear compression stockings. These stockings help to prevent blood clots and reduce swelling in your legs. If you were given a sedative during the procedure, it can affect you for several hours. Do not drive or operate machinery until your health care provider says that it is safe. Summary Transurethral resection of the prostate (TURP) is the removal  (resection) of part of the prostate tissue. The goal of this procedure is to remove enough prostate tissue to allow for a normal flow of urine. Follow instructions from your health care provider about taking medicines and about eating and drinking before the procedure. This information is not intended to replace advice given to you by your health care provider. Make sure you discuss any questions you have with your health care provider. Document Revised: 11/22/2020 Document Reviewed: 11/22/2020 Elsevier Patient Education  2025 [employer].

## 2024-04-03 NOTE — Progress Notes (Signed)
 "  04/03/2024 9:00 AM   [name] [birth year] [id number]  Referring provider: Lari Elspeth BRAVO, MD [address] [place],  [place] [address]  Followup BPh and erectile dysfunction   HPI: [name] is a 68yo here for followup for BPh and erectile dysfunction. IPSS 6 QOL 3 on rapaflo  8mg  daily and finasteride . Nocturia 2x. Urine stream mostly strong. He tried tadalafil  20mg  prn with poor results. He is not interested in further therapy.   PMH: Past Medical History:  Diagnosis Date   Arthritis    generalized   Coronary artery disease    stents placed 2003   Hyperlipidemia    Hypertension    Sleep apnea    on CPAP    Surgical History: Past Surgical History:  Procedure Laterality Date   BARIATRIC SURGERY  12/2015   BICEPS TENDON REPAIR     CARDIAC CATHETERIZATION  2000   DRUG INDUCED ENDOSCOPY N/A 08/26/2020   Procedure: DRUG INDUCED ENDOSCOPY;  Surgeon: Levi Lenis, MD;  Location: [facility];  Service: ENT;  Laterality: N/A;   IMPLANTATION OF HYPOGLOSSAL NERVE STIMULATOR Right 01/25/2021   Procedure: IMPLANTATION OF HYPOGLOSSAL NERVE STIMULATOR;  Surgeon: Levi Lenis, MD;  Location: [facility] OR;  Service: ENT;  Laterality: Right;   KNEE ARTHROSCOPY Bilateral    septorhinoplasty     SHOULDER ARTHROSCOPY Bilateral    TONSILLECTOMY     UVULLA REMOVAL      Home Medications:  Allergies as of 04/03/2024   No Known Allergies      Medication List        Accurate as of April 03, 2024  9:00 AM. If you have any questions, ask your nurse or doctor.          aspirin 81 MG tablet Take 81 mg by mouth daily.   b complex vitamins tablet Take 1 tablet by mouth daily.   Calcium  500-100 MG-UNIT Chew Chew 1 tablet by mouth 4 (four) times daily.   cyanocobalamin  1000 MCG tablet Commonly known as: VITAMIN B12 Take 1,000 mcg by mouth daily.   ferrous sulfate 325 (65 FE) MG EC tablet Take 325 mg by mouth daily.   finasteride  5 MG  tablet Commonly known as: PROSCAR  Take 1 tablet (5 mg total) by mouth daily.   icosapent Ethyl 1 g capsule Commonly known as: VASCEPA Take 1 g by mouth 2 (two) times daily at 10 AM and 5 PM.   Jardiance 25 MG Tabs tablet Generic drug: empagliflozin Take 12.5 mg by mouth daily.   metFORMIN 850 MG tablet Commonly known as: GLUCOPHAGE Take 850 mg by mouth 2 (two) times daily with a meal.   midodrine  2.5 MG tablet Commonly known as: PROAMATINE  Take 1 tablet (2.5 mg total) by mouth 3 (three) times daily as needed (for a blood pressure reading less than 90/60).   multivitamin tablet Take 1 tablet by mouth 2 (two) times daily.   OMEGA-3 FISH OIL PO Take 1 tablet by mouth 2 (two) times daily at 10 AM and 5 PM.   pantoprazole 40 MG tablet Commonly known as: PROTONIX Take 40 mg by mouth daily.   potassium chloride SA 20 MEQ tablet Commonly known as: KLOR-CON M Take 20 mEq by mouth 2 (two) times daily.   rosuvastatin  5 MG tablet Commonly known as: CRESTOR  Take 1 tablet (5 mg total) by mouth daily.   silodosin  8 MG Caps capsule Commonly known as: RAPAFLO  Take 1 capsule (8 mg total) by  mouth at bedtime.   SYSTANE OP Place 1 drop into both eyes daily as needed (dry eyes).   tadalafil  20 MG tablet Commonly known as: CIALIS  Take 1 tablet (20 mg total) by mouth as needed.   Vitamin D 50 MCG (2000 UT) tablet Take 6,000 Units by mouth daily.        Allergies: Allergies[1]  Family History: Family History  Problem Relation Age of Onset   Diabetes Mother    Diabetes Father    Heart attack Father     Social History:  reports that he has never smoked. He has never used smokeless tobacco. He reports that he does not currently use alcohol . He reports that he does not use drugs.  ROS: All other review of systems were reviewed and are negative except what is noted above in HPI  Physical Exam: BP 130/78   Pulse 92   Constitutional:  Alert and oriented, No acute  distress. HEENT: [name] AT, moist mucus membranes.  Trachea midline, no masses. Cardiovascular: No clubbing, cyanosis, or edema. Respiratory: Normal respiratory effort, no increased work of breathing. GI: Abdomen is soft, nontender, nondistended, no abdominal masses GU: No CVA tenderness.  Lymph: No cervical or inguinal lymphadenopathy. Skin: No rashes, bruises or suspicious lesions. Neurologic: Grossly intact, no focal deficits, moving all 4 extremities. Psychiatric: Normal mood and affect.  Laboratory Data: Lab Results  Component Value Date   WBC 8.5 01/18/2021   HGB 12.0 (L) 01/18/2021   HCT 37.8 (L) 01/18/2021   MCV 90.9 01/18/2021   PLT 159 01/18/2021    Lab Results  Component Value Date   CREATININE 0.79 07/30/2023    No results found for: PSA  No results found for: TESTOSTERONE  Lab Results  Component Value Date   HGBA1C (H) 02/19/2007    8.5 (NOTE)   The ADA recommends the following therapeutic goals for glycemic   control related to Hgb A1C measurement:   Goal of Therapy:   < 7.0% Hgb A1C   Action Suggested:  > 8.0% Hgb A1C   Ref:  Diabetes Care, 22, Suppl. 1, 1999    Urinalysis    Component Value Date/Time   APPEARANCEUR Clear 10/02/2023 0836   GLUCOSEU 3+ (A) 10/02/2023 0836   BILIRUBINUR Negative 10/02/2023 0836   PROTEINUR Negative 10/02/2023 0836   NITRITE Negative 10/02/2023 0836   LEUKOCYTESUR Negative 10/02/2023 0836    Lab Results  Component Value Date   LABMICR Comment 10/02/2023   WBCUA 0-5 07/18/2021   LABEPIT 0-10 07/18/2021   BACTERIA Few (A) 07/18/2021    Pertinent Imaging:  No results found for this or any previous visit.  No results found for this or any previous visit.  No results found for this or any previous visit.  No results found for this or any previous visit.  No results found for this or any previous visit.  No results found for this or any previous visit.  No results found for this or any previous visit.  No  results found for this or any previous visit.    Cystoscopy Procedure Note  Patient identification was confirmed, informed consent was obtained, and patient was prepped using Betadine solution.  Lidocaine  jelly was administered per urethral meatus.     Pre-Procedure: - Inspection reveals a normal caliber ureteral meatus.  Procedure: The flexible cystoscope was introduced without difficulty - No urethral strictures/lesions are present. - Enlarged prostate NO MEDIAN LOBE - Normal bladder neck - Bilateral ureteral orifices identified - Bladder mucosa  reveals no ulcers, tumors, or lesions - No bladder stones - No trabeculation  Retroflexion shows 1cm intravesical prostatic protrusion   Post-Procedure: - Patient tolerated the procedure well   Assessment & Plan:    1. Incomplete bladder emptying (Primary) Continue rapaflo  8mg  daily - Urinalysis, Routine w reflex microscopic - BLADDER SCAN AMB NON-IMAGING  2. BPH with obstruction/lower urinary tract symptoms We discussed the management of his BPH including continued medical therapy, Rezum, Urolift, TURP and simple prostatectomy. After discussing the options the patient is undecided. He. will call with his decision. - Urinalysis, Routine w reflex microscopic - BLADDER SCAN AMB NON-IMAGING  3. Erectile dysfunction -patient defers therapy at this time   No follow-ups on file.  Belvie Clara, MD  [facility] [place]       [1] No Known Allergies  "

## 2024-04-03 NOTE — Progress Notes (Signed)
   Patient can void prior to the bladder scan. Bladder scan result: 195  Performed By: [facility] LPN

## 2024-04-07 ENCOUNTER — Encounter: Payer: Self-pay | Admitting: Urology

## 2024-10-16 ENCOUNTER — Ambulatory Visit: Admitting: Urology
